# Patient Record
Sex: Male | Born: 1950
Health system: Southern US, Community
[De-identification: ages and names within clinical notes are randomized; demographics above are authoritative.]

## PROBLEM LIST (undated history)

## (undated) DIAGNOSIS — M549 Dorsalgia, unspecified: Secondary | ICD-10-CM

## (undated) DIAGNOSIS — H3320 Serous retinal detachment, unspecified eye: Secondary | ICD-10-CM

## (undated) DIAGNOSIS — K219 Gastro-esophageal reflux disease without esophagitis: Secondary | ICD-10-CM

## (undated) DIAGNOSIS — N200 Calculus of kidney: Secondary | ICD-10-CM

## (undated) DIAGNOSIS — H43813 Vitreous degeneration, bilateral: Principal | ICD-10-CM

## (undated) DIAGNOSIS — F419 Anxiety disorder, unspecified: Secondary | ICD-10-CM

## (undated) DIAGNOSIS — I10 Essential (primary) hypertension: Secondary | ICD-10-CM

## (undated) DIAGNOSIS — S46119A Strain of muscle, fascia and tendon of long head of biceps, unspecified arm, initial encounter: Secondary | ICD-10-CM

## (undated) DIAGNOSIS — H3323 Serous retinal detachment, bilateral: Secondary | ICD-10-CM

## (undated) DIAGNOSIS — D649 Anemia, unspecified: Secondary | ICD-10-CM

## (undated) DIAGNOSIS — N189 Chronic kidney disease, unspecified: Secondary | ICD-10-CM

## (undated) HISTORY — DX: Chronic kidney disease, unspecified: N18.9

## (undated) HISTORY — DX: Essential (primary) hypertension: I10

## (undated) HISTORY — DX: Dorsalgia, unspecified: M54.9

## (undated) HISTORY — DX: Serous retinal detachment, unspecified eye: H33.20

## (undated) HISTORY — DX: Vitreous degeneration, bilateral: H43.813

## (undated) HISTORY — DX: Serous retinal detachment, bilateral: H33.23

## (undated) HISTORY — PX: HYDROCELE EXCISION / REPAIR: SUR1145

## (undated) HISTORY — PX: RETINAL DETACHMENT SURGERY: SHX105

## (undated) HISTORY — DX: Anemia, unspecified: D64.9

## (undated) HISTORY — PX: NASAL SINUS SURGERY: SHX719

## (undated) HISTORY — DX: Calculus of kidney: N20.0

## (undated) HISTORY — DX: Gastro-esophageal reflux disease without esophagitis: K21.9

## (undated) HISTORY — PX: VASECTOMY: SHX75

## (undated) HISTORY — DX: Anxiety disorder, unspecified: F41.9

## (undated) HISTORY — PX: LITHOTRIPSY: SUR834

## (undated) HISTORY — DX: Strain of muscle, fascia and tendon of long head of biceps, unspecified arm, initial encounter: S46.119A

## (undated) HISTORY — PX: SPINAL FUSION: SHX223

## (undated) HISTORY — PX: COLONOSCOPY W/ BIOPSIES AND POLYPECTOMY: SHX1376

## (undated) HISTORY — PX: KNEE ARTHROSCOPY: SUR90

---

## 2001-09-04 ENCOUNTER — Encounter: Payer: Self-pay | Admitting: Neurosurgery

## 2001-09-04 ENCOUNTER — Encounter: Admission: RE | Admit: 2001-09-04 | Discharge: 2001-09-04 | Payer: Self-pay | Admitting: Neurosurgery

## 2001-09-18 ENCOUNTER — Encounter: Payer: Self-pay | Admitting: Neurosurgery

## 2001-09-18 ENCOUNTER — Encounter: Admission: RE | Admit: 2001-09-18 | Discharge: 2001-09-18 | Payer: Self-pay | Admitting: Neurosurgery

## 2001-10-25 ENCOUNTER — Encounter: Admission: RE | Admit: 2001-10-25 | Discharge: 2001-10-25 | Payer: Self-pay | Admitting: Neurosurgery

## 2001-10-25 ENCOUNTER — Encounter: Payer: Self-pay | Admitting: Neurosurgery

## 2001-11-29 ENCOUNTER — Encounter: Payer: Self-pay | Admitting: Neurosurgery

## 2001-12-01 ENCOUNTER — Ambulatory Visit (HOSPITAL_COMMUNITY): Admission: RE | Admit: 2001-12-01 | Discharge: 2001-12-01 | Payer: Self-pay | Admitting: Neurosurgery

## 2001-12-04 ENCOUNTER — Encounter: Admission: RE | Admit: 2001-12-04 | Discharge: 2001-12-04 | Payer: Self-pay | Admitting: Neurosurgery

## 2001-12-04 ENCOUNTER — Encounter: Payer: Self-pay | Admitting: Neurosurgery

## 2001-12-29 ENCOUNTER — Encounter: Payer: Self-pay | Admitting: Neurosurgery

## 2001-12-29 ENCOUNTER — Inpatient Hospital Stay (HOSPITAL_COMMUNITY): Admission: RE | Admit: 2001-12-29 | Discharge: 2002-01-01 | Payer: Self-pay | Admitting: Neurosurgery

## 2002-02-13 ENCOUNTER — Encounter: Admission: RE | Admit: 2002-02-13 | Discharge: 2002-02-13 | Payer: Self-pay | Admitting: Neurosurgery

## 2002-02-13 ENCOUNTER — Encounter: Payer: Self-pay | Admitting: Neurosurgery

## 2004-01-12 HISTORY — PX: SPINE SURGERY: SHX786

## 2004-09-29 ENCOUNTER — Ambulatory Visit (HOSPITAL_BASED_OUTPATIENT_CLINIC_OR_DEPARTMENT_OTHER): Admission: RE | Admit: 2004-09-29 | Discharge: 2004-09-29 | Payer: Self-pay | Admitting: Urology

## 2004-09-29 ENCOUNTER — Ambulatory Visit (HOSPITAL_COMMUNITY): Admission: RE | Admit: 2004-09-29 | Discharge: 2004-09-29 | Payer: Self-pay | Admitting: Urology

## 2006-04-15 ENCOUNTER — Ambulatory Visit: Payer: Self-pay | Admitting: Physical Medicine & Rehabilitation

## 2006-05-20 ENCOUNTER — Ambulatory Visit: Payer: Self-pay | Admitting: Physical Medicine & Rehabilitation

## 2006-08-05 ENCOUNTER — Ambulatory Visit: Payer: Self-pay | Admitting: Physical Medicine & Rehabilitation

## 2006-09-28 ENCOUNTER — Ambulatory Visit: Payer: Self-pay | Admitting: Physical Medicine & Rehabilitation

## 2006-12-30 ENCOUNTER — Ambulatory Visit: Payer: Self-pay | Admitting: Physical Medicine & Rehabilitation

## 2007-01-04 ENCOUNTER — Ambulatory Visit: Payer: Self-pay | Admitting: Family Medicine

## 2007-01-04 DIAGNOSIS — M5137 Other intervertebral disc degeneration, lumbosacral region: Secondary | ICD-10-CM | POA: Insufficient documentation

## 2007-01-04 DIAGNOSIS — G4700 Insomnia, unspecified: Secondary | ICD-10-CM | POA: Insufficient documentation

## 2007-01-04 LAB — CONVERTED CEMR LAB
AST: 24 units/L (ref 0–37)
Alkaline Phosphatase: 61 units/L (ref 39–117)
BUN: 12 mg/dL (ref 6–23)
Glucose, Bld: 101 mg/dL — ABNORMAL HIGH (ref 70–99)
HDL: 47 mg/dL (ref >39)
Hemoglobin: 16.6 g/dL (ref 13.0–17.0)
LDL Cholesterol: 117 mg/dL — ABNORMAL HIGH (ref 0–99)
MCHC: 33.5 g/dL (ref 30.0–36.0)
MCV: 91.7 fL (ref 78.0–100.0)
RBC: 5.4 M/uL (ref 4.22–5.81)
RDW: 13.2 % (ref 11.5–15.5)
Sodium: 141 meq/L (ref 135–145)
Total Bilirubin: 1.3 mg/dL — ABNORMAL HIGH (ref 0.3–1.2)
Total CHOL/HDL Ratio: 4.1
Triglycerides: 152 mg/dL — ABNORMAL HIGH (ref <150)
VLDL: 30 mg/dL (ref 0–40)

## 2007-01-09 ENCOUNTER — Encounter: Payer: Self-pay | Admitting: Family Medicine

## 2007-01-19 ENCOUNTER — Telehealth: Payer: Self-pay | Admitting: Family Medicine

## 2007-03-31 ENCOUNTER — Ambulatory Visit: Payer: Self-pay | Admitting: Physical Medicine & Rehabilitation

## 2007-04-04 ENCOUNTER — Telehealth: Payer: Self-pay | Admitting: Family Medicine

## 2007-04-04 ENCOUNTER — Ambulatory Visit: Payer: Self-pay | Admitting: Family Medicine

## 2007-04-05 ENCOUNTER — Telehealth (INDEPENDENT_AMBULATORY_CARE_PROVIDER_SITE_OTHER): Payer: Self-pay | Admitting: *Deleted

## 2007-04-28 ENCOUNTER — Ambulatory Visit: Payer: Self-pay | Admitting: Physical Medicine & Rehabilitation

## 2007-05-10 ENCOUNTER — Telehealth: Payer: Self-pay | Admitting: Family Medicine

## 2007-06-02 ENCOUNTER — Ambulatory Visit: Payer: Self-pay | Admitting: Physical Medicine & Rehabilitation

## 2007-06-30 ENCOUNTER — Ambulatory Visit: Payer: Self-pay | Admitting: Physical Medicine & Rehabilitation

## 2007-07-21 ENCOUNTER — Encounter
Admission: RE | Admit: 2007-07-21 | Discharge: 2007-09-21 | Payer: Self-pay | Admitting: Physical Medicine & Rehabilitation

## 2007-07-28 ENCOUNTER — Ambulatory Visit: Payer: Self-pay | Admitting: Physical Medicine & Rehabilitation

## 2007-08-02 ENCOUNTER — Encounter: Admission: RE | Admit: 2007-08-02 | Discharge: 2007-08-02 | Payer: Self-pay | Admitting: Family Medicine

## 2007-08-02 ENCOUNTER — Ambulatory Visit: Payer: Self-pay | Admitting: Family Medicine

## 2007-08-02 DIAGNOSIS — R319 Hematuria, unspecified: Secondary | ICD-10-CM | POA: Insufficient documentation

## 2007-08-02 DIAGNOSIS — F17200 Nicotine dependence, unspecified, uncomplicated: Secondary | ICD-10-CM | POA: Insufficient documentation

## 2007-08-02 LAB — CONVERTED CEMR LAB
Nitrite: NEGATIVE
Protein, U semiquant: 30
Urobilinogen, UA: 0.2
WBC Urine, dipstick: NEGATIVE
pH: 6

## 2007-08-03 LAB — CONVERTED CEMR LAB
ALT: 28 units/L (ref 0–53)
BUN: 13 mg/dL (ref 6–23)
CO2: 24 meq/L (ref 19–32)
Calcium: 9.8 mg/dL (ref 8.4–10.5)
Chloride: 104 meq/L (ref 96–112)
Creatinine, Ser: 0.91 mg/dL (ref 0.40–1.50)
Glucose, Bld: 115 mg/dL — ABNORMAL HIGH (ref 70–99)

## 2007-08-04 ENCOUNTER — Encounter: Payer: Self-pay | Admitting: Family Medicine

## 2007-08-04 ENCOUNTER — Ambulatory Visit: Payer: Self-pay | Admitting: Family Medicine

## 2007-08-04 DIAGNOSIS — J449 Chronic obstructive pulmonary disease, unspecified: Secondary | ICD-10-CM | POA: Insufficient documentation

## 2007-08-15 ENCOUNTER — Telehealth: Payer: Self-pay | Admitting: Family Medicine

## 2007-08-15 ENCOUNTER — Encounter: Payer: Self-pay | Admitting: Family Medicine

## 2007-08-24 ENCOUNTER — Ambulatory Visit (HOSPITAL_COMMUNITY): Admission: RE | Admit: 2007-08-24 | Discharge: 2007-08-24 | Payer: Self-pay | Admitting: Urology

## 2007-08-28 ENCOUNTER — Ambulatory Visit: Payer: Self-pay | Admitting: Family Medicine

## 2007-09-01 ENCOUNTER — Ambulatory Visit: Payer: Self-pay | Admitting: Physical Medicine & Rehabilitation

## 2007-09-29 ENCOUNTER — Ambulatory Visit: Payer: Self-pay | Admitting: Physical Medicine & Rehabilitation

## 2007-10-27 ENCOUNTER — Ambulatory Visit: Payer: Self-pay | Admitting: Physical Medicine & Rehabilitation

## 2007-12-22 ENCOUNTER — Ambulatory Visit: Payer: Self-pay | Admitting: Physical Medicine & Rehabilitation

## 2007-12-26 ENCOUNTER — Ambulatory Visit: Payer: Self-pay | Admitting: Family Medicine

## 2008-02-09 ENCOUNTER — Ambulatory Visit: Payer: Self-pay | Admitting: Physical Medicine & Rehabilitation

## 2008-03-15 ENCOUNTER — Ambulatory Visit: Payer: Self-pay | Admitting: Physical Medicine & Rehabilitation

## 2008-04-19 ENCOUNTER — Ambulatory Visit: Payer: Self-pay | Admitting: Physical Medicine & Rehabilitation

## 2008-06-14 ENCOUNTER — Ambulatory Visit: Payer: Self-pay | Admitting: Physical Medicine & Rehabilitation

## 2008-08-05 ENCOUNTER — Telehealth: Payer: Self-pay | Admitting: Family Medicine

## 2008-08-15 ENCOUNTER — Ambulatory Visit: Payer: Self-pay | Admitting: Family Medicine

## 2008-08-16 LAB — CONVERTED CEMR LAB
ALT: 33 units/L (ref 0–53)
CO2: 22 meq/L (ref 19–32)
Cholesterol: 223 mg/dL — ABNORMAL HIGH (ref 0–200)
LDL Cholesterol: 127 mg/dL — ABNORMAL HIGH (ref 0–99)
Potassium: 4.2 meq/L (ref 3.5–5.3)
Sodium: 142 meq/L (ref 135–145)
Total Bilirubin: 1.2 mg/dL (ref 0.3–1.2)
Total Protein: 7.3 g/dL (ref 6.0–8.3)
VLDL: 41 mg/dL — ABNORMAL HIGH (ref 0–40)

## 2008-11-19 ENCOUNTER — Telehealth: Payer: Self-pay | Admitting: Family Medicine

## 2008-12-09 ENCOUNTER — Ambulatory Visit: Payer: Self-pay | Admitting: Family Medicine

## 2008-12-09 DIAGNOSIS — H02419 Mechanical ptosis of unspecified eyelid: Secondary | ICD-10-CM | POA: Insufficient documentation

## 2008-12-19 ENCOUNTER — Encounter: Payer: Self-pay | Admitting: Family Medicine

## 2009-02-01 IMAGING — CR DG ABDOMEN 1V
1 series · 1 of 1 positions shown · non-contrast
Comparison: No prior studies available.

CLINICAL DATA: Right ureteral pelvic junction stone.  Pre
lithotripsy.

ABDOMEN - 1 VIEW

[t abdomen supine]
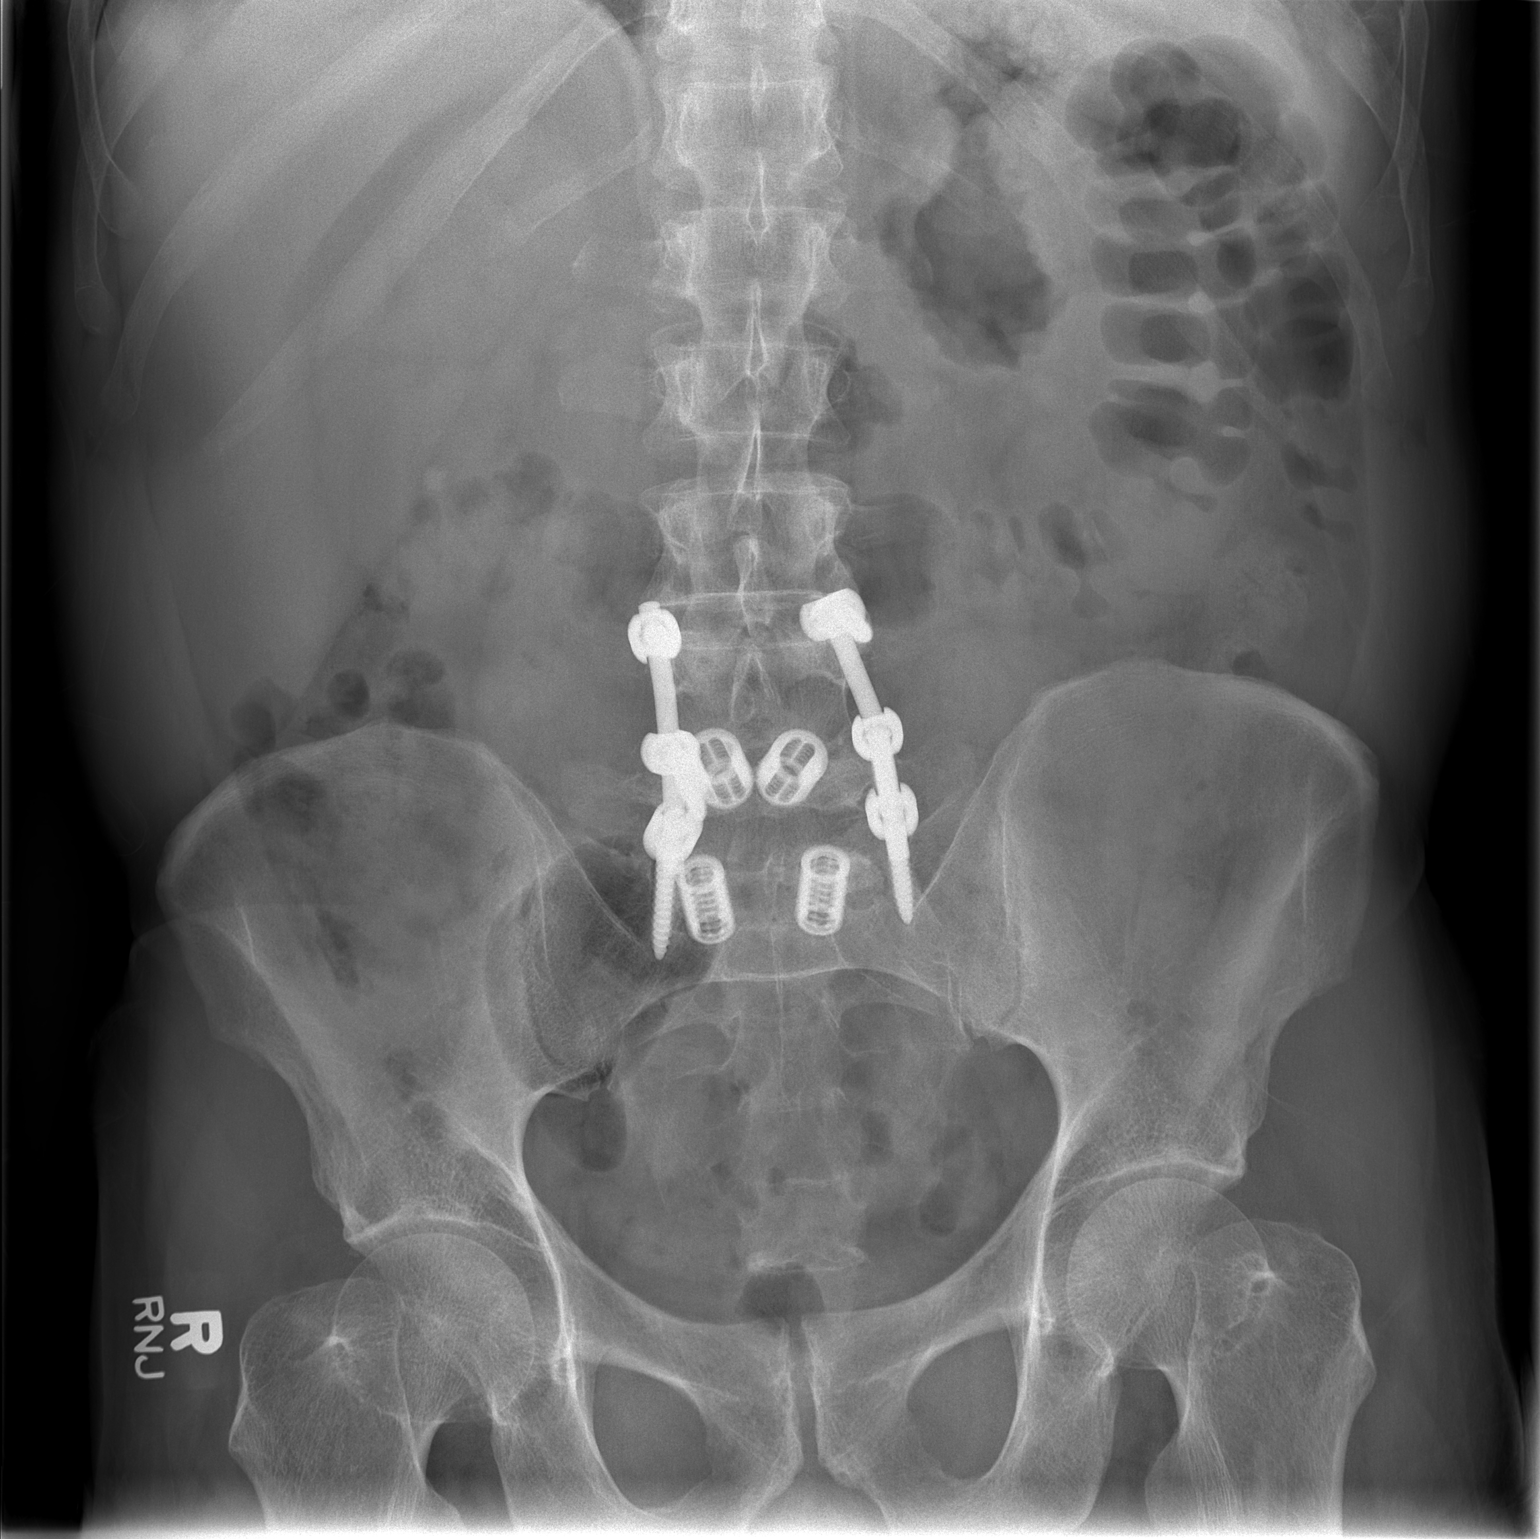

[1 of 1 positions shown; findings below may reference images not displayed]

FINDINGS: There is a 6 mm in diameter stone associated with the
lower pole of the right kidney.  There are no other definite stones
noted by plain film.  There has been a previous lower lumbar fusion
from the L4 through S1 levels.  The bowel gas pattern is normal.
IMPRESSION: 6 mm in diameter lower pole right renal calculus.

## 2009-02-07 ENCOUNTER — Encounter: Payer: Self-pay | Admitting: Family Medicine

## 2009-02-13 ENCOUNTER — Telehealth: Payer: Self-pay | Admitting: Family Medicine

## 2009-03-14 ENCOUNTER — Telehealth: Payer: Self-pay | Admitting: Family Medicine

## 2009-04-17 ENCOUNTER — Ambulatory Visit: Payer: Self-pay | Admitting: Family Medicine

## 2009-04-17 LAB — CONVERTED CEMR LAB: Blood Glucose, AC Bkfst: 87 mg/dL

## 2009-04-21 ENCOUNTER — Encounter: Payer: Self-pay | Admitting: Family Medicine

## 2009-04-21 LAB — CONVERTED CEMR LAB
Cholesterol: 219 mg/dL — ABNORMAL HIGH (ref 0–200)
Total CHOL/HDL Ratio: 4.2

## 2009-05-20 ENCOUNTER — Ambulatory Visit: Payer: Self-pay | Admitting: Family Medicine

## 2009-10-15 ENCOUNTER — Ambulatory Visit: Payer: Self-pay | Admitting: Family Medicine

## 2009-10-15 DIAGNOSIS — N200 Calculus of kidney: Secondary | ICD-10-CM | POA: Insufficient documentation

## 2009-11-17 ENCOUNTER — Ambulatory Visit: Payer: Self-pay | Admitting: Family Medicine

## 2009-11-18 ENCOUNTER — Encounter (INDEPENDENT_AMBULATORY_CARE_PROVIDER_SITE_OTHER): Payer: Self-pay | Admitting: *Deleted

## 2009-12-16 ENCOUNTER — Ambulatory Visit (HOSPITAL_COMMUNITY): Payer: Self-pay | Admitting: Psychology

## 2009-12-25 ENCOUNTER — Ambulatory Visit (HOSPITAL_COMMUNITY): Payer: Self-pay | Admitting: Licensed Clinical Social Worker

## 2010-01-19 ENCOUNTER — Ambulatory Visit: Admit: 2010-01-19 | Payer: Self-pay | Admitting: Family Medicine

## 2010-01-23 ENCOUNTER — Ambulatory Visit
Admission: RE | Admit: 2010-01-23 | Discharge: 2010-01-23 | Payer: Self-pay | Source: Home / Self Care | Attending: Family Medicine | Admitting: Family Medicine

## 2010-01-27 ENCOUNTER — Ambulatory Visit (HOSPITAL_COMMUNITY)
Admission: RE | Admit: 2010-01-27 | Discharge: 2010-01-27 | Payer: Self-pay | Source: Home / Self Care | Attending: Licensed Clinical Social Worker | Admitting: Licensed Clinical Social Worker

## 2010-02-08 LAB — CONVERTED CEMR LAB
ALT: 29 units/L (ref 0–53)
Bilirubin Urine: NEGATIVE
Blood in Urine, dipstick: NEGATIVE
CO2: 29 meq/L (ref 19–32)
Calcium: 9.9 mg/dL (ref 8.4–10.5)
Chloride: 101 meq/L (ref 96–112)
Cholesterol: 210 mg/dL — ABNORMAL HIGH (ref 0–200)
Glucose, Urine, Semiquant: NEGATIVE
Potassium: 4.3 meq/L (ref 3.5–5.3)
Protein, U semiquant: NEGATIVE
Sodium: 140 meq/L (ref 135–145)
Total Protein: 6.8 g/dL (ref 6.0–8.3)
VLDL: 49 mg/dL — ABNORMAL HIGH (ref 0–40)
pH: 6

## 2010-02-12 NOTE — Assessment & Plan Note (Signed)
Summary: f/u GAD   Vital Signs:  Patient profile:   60 year old male Height:      68.5 inches Weight:      161 pounds BMI:     24.21 O2 Sat:      98 % on Room air Pulse rate:   91 / minute BP sitting:   138 / 90  (left arm) Cuff size:   regular  Vitals Entered By: Payton Spark CMA (November 17, 2009 10:40 AM)  O2 Flow:  Room air CC: F/U mood and sleep.    Primary Care Angely Dietz:  Seymour Bars DO  CC:  F/U mood and sleep. [name]  History of Present Illness: 60 yo WM presents for f/u mood and chronic insomnia.  He was changed over to Seroquel last night at night for his anxiety and sleep problems b/c Alfonso Patten was not helping.  He was too sedated during the day on Seroquel XR  50 mg/ day.    He has been on SSRIs and SNRIs in the past but had such severe GERD that he could not tolerate either one.  He was seen by [facility] years ago but has not been seen for a while.      Current Medications (verified): 1)  Seroquel Xr 50 Mg Xr24h-Tab (Quetiapine Fumarate) .[name].. 1 Tab By Mouth At Bedtime 2)  Tramadol Hcl 50 Mg  Tabs (Tramadol Hcl) .... Take 1-2 Tablet By Mouth Every 8 Hours 3)  Alprazolam 0.5 Mg  Tabs (Alprazolam) .... Take 1/2 Tablet By Mouth Bid and 1 Tab Po Qhs. 4)  Omeprazole 40 Mg Cpdr (Omeprazole) .[name].. 1 Tab By Mouth Daily; Take 30 Min Before Breakfast 5)  Multivitamins   Tabs (Multiple Vitamin) .... Take 1 Tablet By Mouth Once A Day 6)  Proair Hfa 108 (90 Base) Mcg/act  Aers (Albuterol Sulfate) .... 2 Puffs Inhaled Every 4-6 Hours As Needed 7)  Finasteride 5 Mg Tabs (Finasteride) .... Take 1 Tablet By Mouth Once A Day 8)  Fish Oil 1000 Mg Caps (Omega-3 Fatty Acids)  Allergies (verified): 1)  ! Pcn 2)  ! Amoxicillin 3)  ! Pravachol  Past History:  Past Medical History: Reviewed history from 12/09/2008 and no changes required. sees Dr Logan Bores (urology) Dr Noelle Penner (GI) Dr Jess Barters (pain managment) Dr Yehuda Mao chiropractor for back pain Dr Marcellina Millin (psych) Dr Channing Mutters (neurosurgery) Dr Mathis Bud ( hematology)  Spirometry 08-04-07 FVC 94%, FEV1 68%, FEV1% is 73%, moderate obstruction with sig improvement in FVC  nephrolithiasis chronic LBP after fusion/ rods  EGD/colon 2008 Hiatal hernia, GERD, diverticulosis anxiety/ sleep/ chronic pain Factor V Leiden carrier  Past Surgical History: Reviewed history from 01/04/2007 and no changes required. sinus surgery 1987 knee 1986 vasectomy 1988 spine disc 2003 L spine fusion/ rods 2006  Social History: Reviewed history from 01/04/2007 and no changes required. Disabled from back problems.  Used to work for [employer]. Lives with parents, no relationships. 4 kids: 1 in [place], others out of state. Smokes 1.5 ppd x 35 yrs. 3-4 ETOH per day. Works out Engineer, manufacturing. Healthy diet.  Review of Systems Psych:  Complains of anxiety, depression, and irritability; denies panic attacks and suicidal thoughts/plans.  Physical Exam  General:  alert, well-developed, well-nourished, and well-hydrated.   Psych:  good eye contact, flat affect, and slightly anxious.     Impression & Recommendations:  Problem # 1:  ANXIETY STATE, UNSPECIFIED (ICD-300.00) Assessment Unchanged Too sedated on Seroquel XR 50 mg qPM so  changed him to Seroquel 25 - 1/2 tab by mouth qPM.  Add behavioral therapy at [facility].  Call if any problems.  It did help his insomnia and he's now off Lunesta.  Call if problems.  F/U in 2 mos.  No changes made to his Xanax.  Complicated by chronic pain syndrome.  Does note that his anxiety was much better on vacation. His updated medication list for this problem includes:    Alprazolam 0.5 Mg Tabs (Alprazolam) .[name]... Take 1/2 tablet by mouth bid and 1 tab po qhs.  Orders: Psychology Referral (Psychology)  Complete Medication List: 1)  Seroquel 25 Mg Tabs (Quetiapine fumarate) .[name].. 1 tab by mouth q pm 2)  Tramadol Hcl 50 Mg Tabs (Tramadol hcl) .... Take 1-2 tablet by mouth  every 8 hours 3)  Alprazolam 0.5 Mg Tabs (Alprazolam) .... Take 1/2 tablet by mouth bid and 1 tab po qhs. 4)  Omeprazole 40 Mg Cpdr (Omeprazole) .[name].. 1 tab by mouth daily; take 30 min before breakfast 5)  Multivitamins Tabs (Multiple vitamin) .... Take 1 tablet by mouth once a day 6)  Proair Hfa 108 (90 Base) Mcg/act Aers (Albuterol sulfate) .... 2 puffs inhaled every 4-6 hours as needed 7)  Finasteride 5 Mg Tabs (Finasteride) .... Take 1 tablet by mouth once a day 8)  Fish Oil 1000 Mg Caps (Omega-3 fatty acids)  Patient Instructions: 1)  Start short acting seroquel -- 1/2 tab at 7 pm each day for anxiety/ sleep. 2)  Stay on same dose of Alprazolam. 3)  Add behavioral therapy at [facility]. 4)  Call if any problems. 5)  REturn for f/u in 2 mos. Prescriptions: SEROQUEL 25 MG TABS (QUETIAPINE FUMARATE) 1 tab by mouth q PM  #30 x 1   Entered and Authorized by:   Seymour Bars DO   Signed by:   Seymour Bars DO on 11/17/2009   Method used:   Electronically to        [facility] [phone]* (retail)       [address] [place], [place]  [address]       Ph: [phone]       Fax: [phone]   RxID:   [id number]    Orders Added: 1)  Psychology Referral [Psychology] 2)  Est. Patient Level III [63875]

## 2010-02-12 NOTE — Progress Notes (Signed)
Summary: Alprazolam refills  Phone Note Refill Request   Refills Requested: Medication #1:  ALPRAZOLAM 0.5 MG  TABS take 1/2 tablet by mouth bid and 1 tab po qhs. Initial call taken by: Payton Spark CMA,  March 14, 2009 3:31 PM    Prescriptions: ALPRAZOLAM 0.5 MG  TABS (ALPRAZOLAM) take 1/2 tablet by mouth bid and 1 tab po qhs.  #60 x 0   Entered and Authorized by:   Seymour Bars DO   Signed by:   Seymour Bars DO on 03/17/2009   Method used:   Telephoned to ...       [facility] [phone]* (retail)       [address] [place], [place]  [address]       Ph: [phone]       Fax: [phone]   RxID:   [id number]  Can you call in his RFs?  Thanks.  Seymour Bars, D.O.  Appended Document: Alprazolam refills Called in

## 2010-02-12 NOTE — Progress Notes (Signed)
  Phone Note Refill Request Message from:  Pharmacy on [facility] [phone]  Refills Requested: Medication #1:  ALPRAZOLAM 0.5 MG  TABS take 1/2 tablet by mouth bid and 1 tab po qhs. Initial call taken by: Kandice Hams,  February 13, 2009 5:08 PM    Prescriptions: ALPRAZOLAM 0.5 MG  TABS (ALPRAZOLAM) take 1/2 tablet by mouth bid and 1 tab po qhs.  #60 x 0   Entered and Authorized by:   Seymour Bars DO   Signed by:   Seymour Bars DO on 02/13/2009   Method used:   Printed then faxed to ...       [facility] [phone]* (retail)       [address] [place], [place]  [address]       Ph: [phone]       Fax: [phone]   RxID:   [id number]   Appended Document:  LEFT MSG RX FAXED

## 2010-02-12 NOTE — Assessment & Plan Note (Signed)
Summary: f/u sleep   Vital Signs:  Patient profile:   60 year old male Height:      68.5 inches Weight:      160 pounds BMI:     24.06 O2 Sat:      98 % on Room air Pulse rate:   98 / minute BP sitting:   155 / 89  (left arm) Cuff size:   regular  Vitals Entered By: Payton Spark CMA (January 23, 2010 1:19 PM)  O2 Flow:  Room air CC: F/U. Seroquel did not work well.    Primary Care Provider:  Seymour Bars DO  CC:  F/U. Seroquel did not work well. [name]  History of Present Illness: 60 yo WM presents for f/u Seroquel.  He had to stop it b/c it knocked him out.  He wants to get back on Lunesta for sleep.  He is seeing Merlene Morse for counseling.  His mood has been stable but he has chronic back pain -- seeing Dr Channing Mutters, on Tramadol alone.  He has been under stress at home.  His father's health is poor.  He has been trying to exercise and do relaxation techniques on a regular basis.       Allergies (verified): 1)  ! Pcn 2)  ! Amoxicillin 3)  ! Pravachol  Past History:  Past Medical History: Reviewed history from 12/09/2008 and no changes required. sees Dr Logan Bores (urology) Dr Noelle Penner (GI) Dr Jess Barters (pain managment) Dr Yehuda Mao chiropractor for back pain Dr Marcellina Millin (psych) Dr Channing Mutters (neurosurgery) Dr Mathis Bud ( hematology)  Spirometry 08-04-07 FVC 94%, FEV1 68%, FEV1% is 73%, moderate obstruction with sig improvement in FVC  nephrolithiasis chronic LBP after fusion/ rods  EGD/colon 2008 Hiatal hernia, GERD, diverticulosis anxiety/ sleep/ chronic pain Factor V Leiden carrier  Past Surgical History: Reviewed history from 01/04/2007 and no changes required. sinus surgery 1987 knee 1986 vasectomy 1988 spine disc 2003 L spine fusion/ rods 2006  Social History: Reviewed history from 01/04/2007 and no changes required. Disabled from back problems.  Used to work for [employer]. Lives with parents, no relationships. 4 kids: 1 in [place], others out of  state. Smokes 1.5 ppd x 35 yrs. 3-4 ETOH per day. Works out Engineer, manufacturing. Healthy diet.  Review of Systems      See HPI  Physical Exam  General:  alert, well-developed, well-nourished, and well-hydrated.   Head:  normocephalic and atraumatic.   Mouth:  pharynx pink and moist.   Neck:  no masses.   Lungs:  Normal respiratory effort, chest expands symmetrically. Lungs are clear to auscultation, no crackles or wheezes. Heart:  Normal rate and regular rhythm. S1 and S2 normal without gallop, murmur, click, rub or other extra sounds. Extremities:  no LE edema Neurologic:  no tremor Skin:  color normal.   Psych:  good eye contact, flat affect, and slightly anxious.     Impression & Recommendations:  Problem # 1:  INSOMNIA (ICD-780.52) Did not tolerate Seroquel or Seroquel XR due to sedation. Will go back to [place].  Avoid ETOH at night and do not take with Alprazolam. We discussed taking Lunesta 3-4 days/ wk instead of 7 days / wk as tolerance developed in the past.   His updated medication list for this problem includes:    Lunesta 3 Mg Tabs (Eszopiclone) .[name]... Take 1 tab by mouth at bedtime  Problem # 2:  DEPRESSION (ICD-311) His depression/ stress/ anxiety/ chronic pain are definitely playing a part into  his chronic insomnia.  In counseling and declined psychiatry f/u or restart SSRIs.  Regular exercise and counseling helping. His updated medication list for this problem includes:    Alprazolam 0.5 Mg Tabs (Alprazolam) .[name]... Take 1/2 tablet by mouth bid and 1 tab po qhs.  Problem # 3:  DISC DISEASE, LUMBAR (ICD-722.52) Managed by Dr Channing Mutters and the chiropractor.  Stable.  Unchanged.  Complete Medication List: 1)  Tramadol Hcl 50 Mg Tabs (Tramadol hcl) .... Take 1-2 tablet by mouth every 8 hours 2)  Alprazolam 0.5 Mg Tabs (Alprazolam) .... Take 1/2 tablet by mouth bid and 1 tab po qhs. 3)  Omeprazole 40 Mg Cpdr (Omeprazole) .[name].. 1 tab by mouth daily; take 30 min before breakfast 4)   Multivitamins Tabs (Multiple vitamin) .... Take 1 tablet by mouth once a day 5)  Proair Hfa 108 (90 Base) Mcg/act Aers (Albuterol sulfate) .... 2 puffs inhaled every 4-6 hours as needed 6)  Finasteride 5 Mg Tabs (Finasteride) .... Take 1 tablet by mouth once a day 7)  Fish Oil 1000 Mg Caps (Omega-3 fatty acids) 8)  Lunesta 3 Mg Tabs (Eszopiclone) .... Take 1 tab by mouth at bedtime  Patient Instructions: 1)  Will change you back to Lunesta at night.  Can take either every other day or M--> F to prevent tolerance. 2)  Return for a nurse BP in 1 month. 3)  Return for f/u visit in 4 mos. Prescriptions: PROAIR HFA 108 (90 BASE) MCG/ACT  AERS (ALBUTEROL SULFATE) 2 puffs inhaled every 4-6 hours as needed  #1 x 2   Entered and Authorized by:   Seymour Bars DO   Signed by:   Seymour Bars DO on 01/23/2010   Method used:   Electronically to        [facility] [phone]* (retail)       [address] [place], [place]  [address]       Ph: [phone]       Fax: [phone]   RxID:   [id number] LUNESTA 3 MG TABS (ESZOPICLONE) Take 1 tab by mouth at bedtime  #30 x 1   Entered and Authorized by:   Seymour Bars DO   Signed by:   Seymour Bars DO on 01/23/2010   Method used:   Printed then faxed to ...       [facility] [phone]* (retail)       [address] [place], [place]  [address]       Ph: [phone]       Fax: [phone]   RxID:   [id number]    Orders Added: 1)  Est. Patient Level IV [30160]

## 2010-02-12 NOTE — Consult Note (Signed)
Summary: [facility]  [facility]   Imported By: Lanelle Bal 03/06/2009 13:07:51  _____________________________________________________________________  External Attachment:    Type:   Image     Comment:   External Document

## 2010-02-12 NOTE — Letter (Signed)
Summary: Primary Care Consult Scheduled Letter  [facility]  [address]   [place], [place] [address]   Phone: [phone]  Fax: [phone]      11/18/2009 MRN: [id number]  QUANTAVIS OBRYANT [address] [place], [place]  [address]    Dear Mr. SCHOWALTER,    We have scheduled an appointment for you.  At the recommendation of Dr.Bowen, we have scheduled you a consult with [facility] on 12/08/09 at 9:30.  Their located in the same building as [place], but they are downstairs in [address]. The office phone number is [phone].  If this appointment day and time is not convenient for you, please feel free to call the office of the counselor you are being referred to at the number listed above and reschedule the appointment.     It is important for you to keep your scheduled appointments. We are here to make sure you are given good patient care.    Thank you, [facility]  Patient Care Coordinator [facility] [place]

## 2010-02-12 NOTE — Assessment & Plan Note (Signed)
Summary: f/u smoking/ lipids   Vital Signs:  Patient profile:   60 year old male Height:      68.5 inches Weight:      162 pounds BMI:     24.36 O2 Sat:      98 % on Room air Pulse rate:   94 / minute BP sitting:   128 / 83  (left arm) Cuff size:   regular  Vitals Entered By: Payton Spark CMA (April 17, 2009 10:24 AM)  O2 Flow:  Room air CC: 6 mo F/U. Fasting labs.    Primary Care Provider:  Seymour Bars DO  CC:  6 mo F/U. Fasting labs. .  History of Present Illness: 60 yo WM presents for f/u IFG.  He has tried eating healthier and is taking Omega 3 Fish Oil for cholesterol.  His fasting sugar is down to 87 today which is great.  He has wanted to quit smoking and is interested in [employer].    He is due for cholesterol.  He came off statins due to SEs.  He is exercising regularly.      Current Medications (verified): 1)  Lunesta 3 Mg  Tabs (Eszopiclone) .... Take 1 Tablet By Mouth Once A Day At Bedtime As Needed 2)  Tramadol Hcl 50 Mg  Tabs (Tramadol Hcl) .... Take 1-2 Tablet By Mouth Every 8 Hours 3)  Alprazolam 0.5 Mg  Tabs (Alprazolam) .... Take 1/2 Tablet By Mouth Bid and 1 Tab Po Qhs. 4)  Omeprazole 20 Mg  Cpdr (Omeprazole) .... Take 1 Tablet By Mouth Once A Day 5)  Multivitamins   Tabs (Multiple Vitamin) .... Take 1 Tablet By Mouth Once A Day 6)  Tizanidine Hcl 4 Mg  Tabs (Tizanidine Hcl) .... Take 1 Tablet By Mouth Once A Day 7)  Proair Hfa 108 (90 Base) Mcg/act  Aers (Albuterol Sulfate) .... 2 Puffs Inhaled Every 4-6 Hours As Needed 8)  Finasteride 5 Mg Tabs (Finasteride) .... Take 1 Tablet By Mouth Once A Day 9)  Fish Oil 1000 Mg Caps (Omega-3 Fatty Acids) .... Take 1 Cap By Mouth Once Daily  Allergies (verified): 1)  ! Pcn 2)  ! Amoxicillin 3)  ! Pravachol  Past History:  Past Medical History: Reviewed history from 12/09/2008 and no changes required. sees Dr Logan Bores (urology) Dr Noelle Penner (GI) Dr Jess Barters (pain managment) Dr Yehuda Mao chiropractor for back  pain Dr Marcellina Millin (psych) Dr Channing Mutters (neurosurgery) Dr Mathis Bud ( hematology)  Spirometry 08-04-07 FVC 94%, FEV1 68%, FEV1% is 73%, moderate obstruction with sig improvement in FVC  nephrolithiasis chronic LBP after fusion/ rods  EGD/colon 2008 Hiatal hernia, GERD, diverticulosis anxiety/ sleep/ chronic pain Factor V Leiden carrier  Past Surgical History: Reviewed history from 01/04/2007 and no changes required. sinus surgery 1987 knee 1986 vasectomy 1988 spine disc 2003 L spine fusion/ rods 2006  Social History: Reviewed history from 01/04/2007 and no changes required. Disabled from back problems.  Used to work for [employer]. Lives with parents, no relationships. 4 kids: 1 in [place], others out of state. Smokes 1.5 ppd x 35 yrs. 3-4 ETOH per day. Works out Engineer, manufacturing. Healthy diet.  Review of Systems      See HPI  Physical Exam  General:  alert, well-developed, well-nourished, and well-hydrated.   Head:  normocephalic and atraumatic.   Mouth:  pharynx pink and moist.   Neck:  no masses.   Lungs:  Normal respiratory effort, chest expands symmetrically. Lungs are clear  to auscultation, no crackles or wheezes. Heart:  Normal rate and regular rhythm. S1 and S2 normal without gallop, murmur, click, rub or other extra sounds. Extremities:  no LE edema Skin:  color normal.   Psych:  good eye contact and flat affect.     Impression & Recommendations:  Problem # 1:  HYPERGLYCEMIA (ICD-790.29) Assessment Improved Fasting sugar down is normal at 87-- great!  Problem # 2:  NICOTINE ADDICTION (ICD-305.1) Counseled for 15 min on smoking cessation and he agrees to start on Chantix.  He seems highly motivated to quit at this time. His updated medication list for this problem includes:    Chantix Starting Month Pak 0.5 Mg X 11 & 1 Mg X 42 Tabs (Varenicline tartrate) .[name]... Take as directed  Problem # 3:  DYSLIPIDEMIA (ICD-272.4) Off statin due to SEs.  Update  fasting labs today.   Orders: T-Lipid Profile ([id number])  Problem # 4:  DEPRESSION (ICD-311) Stable.  Home stressors due to parental illness.  RFd his Alprazolam.   His updated medication list for this problem includes:    Alprazolam 0.5 Mg Tabs (Alprazolam) .[name]... Take 1/2 tablet by mouth bid and 1 tab po qhs.  Complete Medication List: 1)  Lunesta 3 Mg Tabs (Eszopiclone) .... Take 1 tablet by mouth once a day at bedtime as needed 2)  Tramadol Hcl 50 Mg Tabs (Tramadol hcl) .... Take 1-2 tablet by mouth every 8 hours 3)  Alprazolam 0.5 Mg Tabs (Alprazolam) .... Take 1/2 tablet by mouth bid and 1 tab po qhs. 4)  Omeprazole 20 Mg Cpdr (Omeprazole) .... Take 1 tablet by mouth once a day 5)  Multivitamins Tabs (Multiple vitamin) .... Take 1 tablet by mouth once a day 6)  Tizanidine Hcl 4 Mg Tabs (Tizanidine hcl) .... Take 1 tablet by mouth once a day 7)  Proair Hfa 108 (90 Base) Mcg/act Aers (Albuterol sulfate) .... 2 puffs inhaled every 4-6 hours as needed 8)  Finasteride 5 Mg Tabs (Finasteride) .... Take 1 tablet by mouth once a day 9)  Fish Oil 1000 Mg Caps (Omega-3 fatty acids) .... Take 1 cap by mouth once daily 10)  Chantix Starting Month Pak 0.5 Mg X 11 & 1 Mg X 42 Tabs (Varenicline tartrate) .... Take as directed  Other Orders: Fingerstick (36416) Glucose, (CBG) (73220)  Patient Instructions: 1)  Fasting sugar looks great at 87! 2)  Fasting lipids today. 3)  Will call you w/ results on Monday. 4)  Start Chantix for smoking cessation. 5)  Return for f/u visit at the end of your starting month pack.   Prescriptions: CHANTIX STARTING MONTH PAK 0.5 MG X 11 & 1 MG X 42 TABS (VARENICLINE TARTRATE) take as directed  #1 pack x 0   Entered and Authorized by:   Seymour Bars DO   Signed by:   Seymour Bars DO on 04/17/2009   Method used:   Electronically to        [facility] [phone]* (retail)       [address] [place], [place]  [address]       Ph: [phone]       Fax:  [phone]   RxID:   [id number] ALPRAZOLAM 0.5 MG  TABS (ALPRAZOLAM) take 1/2 tablet by mouth bid and 1 tab po qhs.  #60 x 0   Entered and Authorized by:   Seymour Bars DO   Signed by:   Seymour Bars  DO on 04/17/2009   Method used:   Printed then faxed to ...       [facility] [phone]* (retail)       [address] [place], [place]  [address]       Ph: [phone]       Fax: [phone]   RxID:   [id number]   Laboratory Results   Blood Tests     CBG Fasting:: 87mg /dL

## 2010-02-12 NOTE — Assessment & Plan Note (Signed)
Summary: CPE/ EKG   Vital Signs:  Patient profile:   60 year old male Height:      68.5 inches Weight:      160 pounds BMI:     24.06 O2 Sat:      96 % on Room air Pulse rate:   87 / minute BP sitting:   136 / 87  (left arm) Cuff size:   regular  Vitals Entered By: Payton Spark CMA (October 15, 2009 10:25 AM)  O2 Flow:  Room air CC: CPE w/ fasting labs   Primary Care Provider:  Seymour Bars DO  CC:  CPE w/ fasting labs.  History of Present Illness: 60 yo WM presents for CPE.  He is seeing Dr Laurine Blazer for his back pain and has not been back to the pain clinic.  He is due for fasting labs today.    Hx of prostate enlargement and kidney stones.  has not seen urology in the last year.  He has not tried Chantix because he feels a little depressed.   Saw [employer] about 3 yrs ago and tried a lot of different anti depressants but they hurt his stomach so he stopped it.  He has been taking Alprazolam on a regular basis.    Smoking and ETOH- more. Poor sleep at night even with Lunesta.  Drinking more to sleep.      Current Medications (verified): 1)  Lunesta 3 Mg  Tabs (Eszopiclone) .... Take 1 Tablet By Mouth Once A Day At Bedtime As Needed 2)  Tramadol Hcl 50 Mg  Tabs (Tramadol Hcl) .... Take 1-2 Tablet By Mouth Every 8 Hours 3)  Alprazolam 0.5 Mg  Tabs (Alprazolam) .... Take 1/2 Tablet By Mouth Bid and 1 Tab Po Qhs. 4)  Omeprazole 20 Mg  Cpdr (Omeprazole) .... Take 1 Tablet By Mouth Once A Day 5)  Multivitamins   Tabs (Multiple Vitamin) .... Take 1 Tablet By Mouth Once A Day 6)  Proair Hfa 108 (90 Base) Mcg/act  Aers (Albuterol Sulfate) .... 2 Puffs Inhaled Every 4-6 Hours As Needed 7)  Finasteride 5 Mg Tabs (Finasteride) .... Take 1 Tablet By Mouth Once A Day 8)  Chantix Starting Month Pak 0.5 Mg X 11 & 1 Mg X 42 Tabs (Varenicline Tartrate) .... Take As Directed 9)  Fish Oil 1000 Mg Caps (Omega-3 Fatty Acids)  Allergies (verified): 1)  ! Pcn 2)  ! Amoxicillin 3)  !  Pravachol  Past History:  Past Medical History: Reviewed history from 12/09/2008 and no changes required. sees Dr Logan Bores (urology) Dr Noelle Penner (GI) Dr Jess Barters (pain managment) Dr Yehuda Mao chiropractor for back pain Dr Marcellina Millin (psych) Dr Channing Mutters (neurosurgery) Dr Mathis Bud ( hematology)  Spirometry 08-04-07 FVC 94%, FEV1 68%, FEV1% is 73%, moderate obstruction with sig improvement in FVC  nephrolithiasis chronic LBP after fusion/ rods  EGD/colon 2008 Hiatal hernia, GERD, diverticulosis anxiety/ sleep/ chronic pain Factor V Leiden carrier  Past Surgical History: Reviewed history from 01/04/2007 and no changes required. sinus surgery 1987 knee 1986 vasectomy 1988 spine disc 2003 L spine fusion/ rods 2006  Family History: Reviewed history from 04/04/2007 and no changes required. mother: blood clots, PE, colon cancer in 47s father colon cancer, prostate cancer in early 31s., DM only child MGF DM  Social History: Reviewed history from 01/04/2007 and no changes required. Disabled from back problems.  Used to work for [employer]. Lives with parents, no relationships. 4 kids: 1 in [place], others out of state.  Smokes 1.5 ppd x 35 yrs. 3-4 ETOH per day. Works out Engineer, manufacturing. Healthy diet.  Review of Systems       The patient complains of severe indigestion/heartburn.  The patient denies anorexia, fever, weight loss, weight gain, vision loss, decreased hearing, hoarseness, chest pain, syncope, dyspnea on exertion, peripheral edema, prolonged cough, headaches, hemoptysis, abdominal pain, melena, and hematochezia.    Physical Exam  General:  alert, well-developed, well-nourished, and well-hydrated.   Head:  normocephalic and atraumatic.   Eyes:  pupils equal, pupils round, and pupils reactive to light.   Ears:  EACs patent; TMs translucent and gray with good cone of light and bony landmarks.  Nose:  no nasal discharge.   Mouth:  good dentition and pharynx pink and  moist.   Neck:  no masses.  no audible carotid bruits Lungs:  Normal respiratory effort, chest expands symmetrically. Lungs are clear to auscultation, no crackles or wheezes. Heart:  Normal rate and regular rhythm. S1 and S2 normal without gallop, murmur, click, rub or other extra sounds. Abdomen:  Bowel sounds positive,abdomen soft and non-tender without masses, organomegaly, no AA bruits Rectal:  hemoccult neg, no external abnormalities, no hemorrhoids, and normal sphincter tone.   Prostate:  Prostate gland firm and smooth, no enlargement, nodularity, tenderness, mass, asymmetry or induration. Pulses:  2+ radial and pedal pulses Extremities:  no LE edema Skin:  color normal.   Cervical Nodes:  No lymphadenopathy noted Psych:  good eye contact, not anxious appearing, and flat affect.     Impression & Recommendations:  Problem # 1:  HEALTH MAINTENANCE EXAM (ICD-V70.0) Keeping healthy checklist for men reviewed. BP at goal.  BMI at goal. Update fastng labs. DRE + PSA today. Colonoscopy due 2013. Flu shot today. Work on [employer], regular exercise, MVI , ASA 81 mg/ day. EKG today for baseline:NSR at 76 bpm, PR 156 ms, QTc 400 msec, no ST changes, TWI only in AVL.   UA normal.  Start Seroquel at night in place of lunesta for worsening sleep/ mood. F/U in 4 wks.  Call if any problems.  Complete Medication List: 1)  Seroquel Xr 50 Mg Xr24h-tab (Quetiapine fumarate) .[name].. 1 tab by mouth at bedtime 2)  Tramadol Hcl 50 Mg Tabs (Tramadol hcl) .... Take 1-2 tablet by mouth every 8 hours 3)  Alprazolam 0.5 Mg Tabs (Alprazolam) .... Take 1/2 tablet by mouth bid and 1 tab po qhs. 4)  Omeprazole 40 Mg Cpdr (Omeprazole) .[name].. 1 tab by mouth daily; take 30 min before breakfast 5)  Multivitamins Tabs (Multiple vitamin) .... Take 1 tablet by mouth once a day 6)  Proair Hfa 108 (90 Base) Mcg/act Aers (Albuterol sulfate) .... 2 puffs inhaled every 4-6 hours as needed 7)  Finasteride 5 Mg Tabs  (Finasteride) .... Take 1 tablet by mouth once a day 8)  Fish Oil 1000 Mg Caps (Omega-3 fatty acids)  Other Orders: UA Dipstick w/o Micro (automated)  (81003) Flu Vaccine 3yrs + MEDICARE PATIENTS (Z6109) Administration Flu vaccine - MCR (U0454) T-Comprehensive Metabolic Panel (09811-91478) T-Lipid Profile (29562-13086) T-PSA (57846-96295) DRE (M8413) EKG w/ Interpretation (93000)  Patient Instructions: 1)  Change Lunesta to Seroquel XR 50 mg - take 1 tab at 8 pm each night.  This should help with mood and sleep. 2)  Cut back on smoking and drinking. 3)  Update fasting labs. 4)  Will call you w/ results tomorrow. 5)  EKG today. 6)  Flu shot today. 7)  Return for f/u mood/ sleep  in 4 wks. Prescriptions: OMEPRAZOLE 40 MG CPDR (OMEPRAZOLE) 1 tab by mouth daily; take 30 min before breakfast  #30 x 12   Entered and Authorized by:   Seymour Bars DO   Signed by:   Seymour Bars DO on 10/15/2009   Method used:   Electronically to        [facility] [phone]* (retail)       [address] [place], [place]  [address]       Ph: [phone]       Fax: [phone]   RxID:   [id number] SEROQUEL XR 50 MG XR24H-TAB (QUETIAPINE FUMARATE) 1 tab by mouth at bedtime  #30 x 2   Entered and Authorized by:   Seymour Bars DO   Signed by:   Seymour Bars DO on 10/15/2009   Method used:   Electronically to        [facility] [phone]* (retail)       [address] [place], [place]  [address]       Ph: [phone]       Fax: [phone]   RxID:   [id number]   Laboratory Results   Urine Tests    Routine Urinalysis   Color: yellow Appearance: Clear Glucose: negative   (Normal Range: Negative) Bilirubin: negative   (Normal Range: Negative) Ketone: negative   (Normal Range: Negative) Spec. Gravity: 1.010   (Normal Range: 1.003-1.035) Blood: negative   (Normal Range: Negative) pH: 6.0   (Normal Range: 5.0-8.0) Protein: negative   (Normal Range: Negative) Urobilinogen: 0.2    (Normal Range: 0-1) Nitrite: negative   (Normal Range: Negative) Leukocyte Esterace: negative   (Normal Range: Negative)      Flu Vaccine Consent Questions     Do you have a history of severe allergic reactions to this vaccine? no    Any prior history of allergic reactions to egg and/or gelatin? no    Do you have a sensitivity to the preservative Thimersol? no    Do you have a past history of Guillan-Barre Syndrome? no    Do you currently have an acute febrile illness? no    Have you ever had a severe reaction to latex? no    Vaccine information given and explained to patient? yes    Are you currently pregnant? no    Lot Number:[id number]   Exp Date:07/11/2010   Site Given  Left Deltoid IMu

## 2010-02-13 NOTE — Letter (Signed)
Summary: Depression Questionnaire  Depression Questionnaire   Imported By: Lanelle Bal 11/26/2009 10:16:11  _____________________________________________________________________  External Attachment:    Type:   Image     Comment:   External Document

## 2010-02-23 ENCOUNTER — Telehealth (INDEPENDENT_AMBULATORY_CARE_PROVIDER_SITE_OTHER): Payer: Self-pay | Admitting: *Deleted

## 2010-02-23 ENCOUNTER — Encounter: Payer: Self-pay | Admitting: Family Medicine

## 2010-02-23 ENCOUNTER — Ambulatory Visit (INDEPENDENT_AMBULATORY_CARE_PROVIDER_SITE_OTHER): Payer: Self-pay

## 2010-02-23 DIAGNOSIS — I10 Essential (primary) hypertension: Secondary | ICD-10-CM | POA: Insufficient documentation

## 2010-02-27 ENCOUNTER — Encounter (HOSPITAL_COMMUNITY): Payer: Self-pay | Admitting: Licensed Clinical Social Worker

## 2010-03-04 NOTE — Assessment & Plan Note (Signed)
Summary: BP check   Vital Signs:  Patient profile:   60 year old male O2 Sat:      98 % on Room air Pulse rate:   99 / minute BP sitting:   157 / 98  (left arm) Cuff size:   large  Vitals Entered By: Payton Spark CMA (February 23, 2010 1:46 PM)  O2 Flow:  Room air CC: F/U HTN   CC:  F/U HTN.  Allergies: 1)  ! Pcn 2)  ! Amoxicillin 3)  ! Pravachol   Complete Medication List: 1)  Tramadol Hcl 50 Mg Tabs (Tramadol hcl) .... Take 1-2 tablet by mouth every 8 hours 2)  Alprazolam 0.5 Mg Tabs (Alprazolam) .... Take 1/2 tablet by mouth bid and 1 tab po qhs. 3)  Omeprazole 40 Mg Cpdr (Omeprazole) .[name].. 1 tab by mouth daily; take 30 min before breakfast 4)  Multivitamins Tabs (Multiple vitamin) .... Take 1 tablet by mouth once a day 5)  Proair Hfa 108 (90 Base) Mcg/act Aers (Albuterol sulfate) .... 2 puffs inhaled every 4-6 hours as needed 6)  Finasteride 5 Mg Tabs (Finasteride) .... Take 1 tablet by mouth once a day 7)  Fish Oil 1000 Mg Caps (Omega-3 fatty acids) 8)  Lunesta 3 Mg Tabs (Eszopiclone) .... Take 1 tab by mouth at bedtime 9)  Diovan 80 Mg Tabs (Valsartan) .[name].. 1 tab by mouth daily  Patient Instructions: 1)  BP still high. 2)  Add Diovan 80 mg once daily.  Take with dinner each day. 3)  Return for follow up appt in 6 wks. Prescriptions: DIOVAN 80 MG TABS (VALSARTAN) 1 tab by mouth daily  #30 x 2   Entered and Authorized by:   Seymour Bars DO   Signed by:   Seymour Bars DO on 02/23/2010   Method used:   Electronically to        [facility] [phone]* (retail)       [address] [place], [place]  [address]       Ph: [phone]       Fax: [phone]   RxID:   [id number]    Orders Added: 1)  Est. Patient Level I [24401]     CC: Follow-up HTN    HPI: Taking meds? n/a Side effects? n/a Chest pain, SOB, Dizziness? n/a  A/P: HTN (401.1) At goal? If no, Patient will be notified.  5 minutes was spent with patient.

## 2010-03-04 NOTE — Progress Notes (Signed)
Summary: 90 day supply on all meds starting in March  Phone Note Call from Patient

## 2010-03-11 ENCOUNTER — Encounter (INDEPENDENT_AMBULATORY_CARE_PROVIDER_SITE_OTHER): Payer: Medicare Other | Admitting: Licensed Clinical Social Worker

## 2010-03-11 DIAGNOSIS — F4323 Adjustment disorder with mixed anxiety and depressed mood: Secondary | ICD-10-CM

## 2010-04-30 ENCOUNTER — Encounter (HOSPITAL_COMMUNITY): Payer: Medicare Other | Admitting: Licensed Clinical Social Worker

## 2010-05-19 ENCOUNTER — Other Ambulatory Visit: Payer: Self-pay | Admitting: Family Medicine

## 2010-05-23 ENCOUNTER — Encounter: Payer: Self-pay | Admitting: Family Medicine

## 2010-05-25 ENCOUNTER — Encounter: Payer: Self-pay | Admitting: Family Medicine

## 2010-05-25 ENCOUNTER — Ambulatory Visit (INDEPENDENT_AMBULATORY_CARE_PROVIDER_SITE_OTHER): Payer: Medicare Other | Admitting: Family Medicine

## 2010-05-25 VITALS — BP 134/86 | HR 99 | Ht 68.5 in | Wt 159.0 lb

## 2010-05-25 DIAGNOSIS — I10 Essential (primary) hypertension: Secondary | ICD-10-CM

## 2010-05-25 DIAGNOSIS — L559 Sunburn, unspecified: Secondary | ICD-10-CM

## 2010-05-25 NOTE — Patient Instructions (Signed)
Update labs today. Will call you w/ results tomorrow.  Stay on Diovan 80 mg/ day Call me to RF all RX's for 90 day supply on the 1st (will keep Diovan at 160 mg dose so you can cut in half).  Return for f/u in 4 mos.

## 2010-05-25 NOTE — Progress Notes (Signed)
  Subjective:    Patient ID: Jimmy Mayo, male    DOB: 1951/01/02, 60 y.o.   MRN: [id number]  HPI 60 yo WM presents for f/u HTN.  Last visit in Feb, his BP was high and we added Diovan 80 mg/ day.  Doing well on it.  Due for BMP today.  Denies CP, DOE, edema or heart palpitations.  He does continue to smoke.  Notes his skin to look red but has not spent much time in the sun recently.  He is a little itchy and dry. Using a moisturizing lotion.  Denies rash.  [facility]   BP 134/86  Pulse 99  Ht 5' 8.5" (1.74 m)  Wt 159 lb (72.122 kg)  BMI 23.82 kg/m2  SpO2 99%     Review of Systems  Constitutional: Negative for fatigue.  Respiratory: Negative for cough, chest tightness and shortness of breath.   Cardiovascular: Negative for chest pain, palpitations and leg swelling.  Skin: Positive for color change.       Objective:   Physical Exam  Constitutional: He appears well-developed and well-nourished.  Neck: Normal range of motion. Neck supple.  Cardiovascular: Normal rate, regular rhythm and normal heart sounds.   Pulmonary/Chest: Effort normal and breath sounds normal. No respiratory distress. He has no wheezes.  Musculoskeletal: He exhibits no edema.  Skin: Skin is warm and dry.       Appears mildly sunburned all over  Psychiatric: He has a normal mood and affect.          Assessment & Plan:

## 2010-05-25 NOTE — Assessment & Plan Note (Signed)
BP has improved and he is tolerating Diovan well.  He asked about changing to losartan but since he is doing so well, I will write him for the 160s and he can cut them in half.  Update BMP and CBC today.

## 2010-05-25 NOTE — Assessment & Plan Note (Signed)
Pt either has a sunburn from the weekend or has skin irritation from [facility].  Continue to use Vasoline with Aloe Vera. None of his meds cause photosensitivity.  [facility] and will avoid the hot tub for a few days.

## 2010-05-26 ENCOUNTER — Telehealth: Payer: Self-pay | Admitting: Family Medicine

## 2010-05-26 LAB — CBC WITH DIFFERENTIAL/PLATELET
Eosinophils Relative: 2 % (ref 0–5)
HCT: 48.2 % (ref 39.0–52.0)
Lymphocytes Relative: 30 % (ref 12–46)
Lymphs Abs: 2.2 10*3/uL (ref 0.7–4.0)
MCV: 93.1 fL (ref 78.0–100.0)
Neutro Abs: 4.2 10*3/uL (ref 1.7–7.7)
Platelets: 222 10*3/uL (ref 150–400)
RBC: 5.18 MIL/uL (ref 4.22–5.81)
WBC: 7.3 10*3/uL (ref 4.0–10.5)

## 2010-05-26 LAB — BASIC METABOLIC PANEL WITH GFR
CO2: 27 mEq/L (ref 19–32)
Chloride: 100 mEq/L (ref 96–112)
Creat: 0.91 mg/dL (ref 0.40–1.50)
Potassium: 4.8 mEq/L (ref 3.5–5.3)

## 2010-05-26 NOTE — Procedures (Signed)
NAMEMARQUEE, FUCHS NO.:  [id number]   MEDICAL RECORD NO.:  [id number]          PATIENT TYPE:  REC   LOCATION:  [facility]                         FACILITY:  [facility]   PHYSICIAN:  Erick Colace, M.D.DATE OF BIRTH:  [birth year]   DATE OF PROCEDURE:  07/28/2007  DATE OF DISCHARGE:                               OPERATIVE REPORT   PROCEDURE:  Acupuncture treatment, needles placed.   INDICATIONS:  Lumbar post laminectomy pain as well as cervicalgia due to  myofascial pain.   Needles placed bilateral BL-23, BL-52, DU-4, DU-14, bilateral GB, as in  gallbladder 20, bilateral BL-27, and bilateral GB-30 at 4 Hz stim,  alternating with 20 at the GB-22, SI-50 needles, and at 4 Hz continuous  at the other needles.  The patient tolerated the procedure well.   Repeat in one month.   We will see him in about 2 weeks at which time we will follow up on his  TENS evaluation as well as doing other acupuncture treatments.      Erick Colace, M.D.  Electronically Signed     AEK/MEDQ  D:  07/28/2007 12:06:32  T:  07/29/2007 04:04:04  Job:  [id number]

## 2010-05-26 NOTE — Telephone Encounter (Signed)
Pls let pt know that his blood counts and chemistry panel came back normal.  Continue current meds.

## 2010-05-26 NOTE — Procedures (Signed)
[name], [name] NO.:  [id number]   MEDICAL RECORD NO.:  [id number]            PATIENT TYPE:   LOCATION:                                 FACILITY:   PHYSICIAN:  Erick Colace, M.D.   DATE OF BIRTH:   DATE OF PROCEDURE:  06/30/2007  DATE OF DISCHARGE:                               OPERATIVE REPORT   Jimmy Mayo returns today.  This is acupuncture treatment, last  treatment performed on Jun 02, 2007.  Needles were placed at bilateral  BL23, BL25, BL27, GB30, bilateral GB31, bilateral GB20, and CV14, E-  Stim.  Additionally, needles placed at [facility] and BL16 bilaterally.  E-  Stim 4 Hz alternating with 20.  Treatment time 30 minutes.  The patient  tolerated the procedure well.  Repeat in 1 month.   Discussed other treatment options including TENS unit.  We will send him  to PT for a TENS eval.      Erick Colace, M.D.  Electronically Signed     AEK/MEDQ  D:  06/30/2007 12:15:01  T:  07/01/2007 03:17:50  Job:  [id number]

## 2010-05-26 NOTE — Procedures (Signed)
Jimmy Mayo, Jimmy Mayo NO.:  [id number]   MEDICAL RECORD NO.:  [id number]            PATIENT TYPE:   LOCATION:                                 FACILITY:   PHYSICIAN:  Erick Colace, M.D.   DATE OF BIRTH:   DATE OF PROCEDURE:  03/31/2007  DATE OF DISCHARGE:                               OPERATIVE REPORT   PROCEDURE:  Acupuncture treatment.   Needles placed bilateral at BL21, BL23, BL25, BL27, GB30, BL10, SI14, 4  Hz Stim x30 minutes.  In addition, DU4 and DU14 placed.  No E-Stim on  these.  The patient tolerated the procedure well.  Repeat treatment in  one month.      Erick Colace, M.D.  Electronically Signed     AEK/MEDQ  D:  03/31/2007 11:38:27  T:  03/31/2007 11:52:20  Job:  [id number]

## 2010-05-26 NOTE — Procedures (Signed)
Jimmy Mayo, Jimmy Mayo NO.:  [id number]   MEDICAL RECORD NO.:  [id number]         PATIENT TYPE:  [facility]REC   LOCATION:                                 FACILITY:   PHYSICIAN:  Erick Colace, M.D.DATE OF BIRTH:  Aug 13, 1950   DATE OF PROCEDURE:  09/01/2007  DATE OF DISCHARGE:                               OPERATIVE REPORT   This is an acupuncture treatment for the indication of lumbar  postlaminectomy pain as well as cervicalgia due to myofascial pain.   Needles were placed at bilateral BL 21, BL 27, as well as bilateral GB  20, bilateral SI 13, and bilateral BL 11.  Estim 4 Hz x 30 minutes.  The  patient tolerated the procedure well.   POST INJECTION INSTRUCTION:  The patient's TENS units has been helpful  for him.  We will authorize for purchase given that he has had a trial  showing adequate benefits.      Erick Colace, M.D.  Electronically Signed     AEK/MEDQ  D:  09/01/2007 16:54:06  T:  09/02/2007 05:04:12  Job:  [id number]

## 2010-05-26 NOTE — Procedures (Signed)
NAME:  Jimmy Mayo, Jimmy Mayo NO.:  [id number]   MEDICAL RECORD NO.:  [id number]            PATIENT TYPE:   LOCATION:                                 FACILITY:   PHYSICIAN:  Erick Colace, M.D.   DATE OF BIRTH:   DATE OF PROCEDURE:  DATE OF DISCHARGE:                               OPERATIVE REPORT   Acupuncture treatment for treatment lumbar postlaminectomy pain syndrome  as well as cervicalgia due to myofascial pain syndrome, some increasing  myofascial pain right shoulder area.   Needles placed bilaterally at BL 21, BL 23, BL 25, BL 27, GB 21, and SI  13, as well as right SI 14, right SI 11.  On the right side, 5 Hz  alternating with 20, and on the left side 4 Hz steady at 30 minutes.  The patient tolerated the procedure well.  See him back in 1 month.      Erick Colace, M.D.  Electronically Signed     AEK/MEDQ  D:  06/14/2008 12:13:58  T:  06/15/2008 46:27:03  Job:  [id number]

## 2010-05-26 NOTE — Procedures (Signed)
NAMEMARVON, SHILLINGBURG NO.:  [id number]   MEDICAL RECORD NO.:  [id number]          PATIENT TYPE:  REC   LOCATION:  [facility]                         FACILITY:  [facility]   PHYSICIAN:  Erick Colace, M.D.DATE OF BIRTH:  07/10/1950   DATE OF PROCEDURE:  DATE OF DISCHARGE:  09/21/2007                               OPERATIVE REPORT   This is an acupuncture treatment.   INDICATION:  Lumbar postlaminectomy pain syndrome as well as cervicalgia  due to myofascial pain.   Needles placed bilateral BL21, BL23, BL25, BL27, as well as bilateral SI  13, bilateral BL11, and bilateral GB21.  4 Hz stimulation x30 minutes.  The patient tolerated the procedure well.  Post-injection structures  given.      Erick Colace, M.D.  Electronically Signed     AEK/MEDQ  D:  09/29/2007 15:37:33  T:  09/30/2007 04:32:32  Job:  [id number]

## 2010-05-26 NOTE — Procedures (Signed)
NAME:  Jimmy Mayo, Jimmy Mayo NO.:  [id number]   MEDICAL RECORD NO.:  [id number]           PATIENT TYPE:   LOCATION:                                 FACILITY:   PHYSICIAN:  Erick Colace, M.D.   DATE OF BIRTH:   DATE OF PROCEDURE:  DATE OF DISCHARGE:                               OPERATIVE REPORT   This is an acupuncture treatment for lumbar post laminectomy syndrome as  well as cervicalgia due to myofascial pain syndrome.   Needles placed bilaterally BL 21, BL 23, BL 25, BL 27 as well as  bilateral BL 11, bilateral GB 20 and right GB 21, right SI 11, 4-Hz  stimulation, 30 minutes.  The patient tolerated the procedure well.  We  will see him back in 1 month.      Erick Colace, M.D.  Electronically Signed     AEK/MEDQ  D:  03/15/2008 11:33:20  T:  03/16/2008 03:21:57  Job:  [id number]

## 2010-05-26 NOTE — Procedures (Signed)
NAMEDARIL, WARGA NO.:  [id number]   MEDICAL RECORD NO.:  [id number]            PATIENT TYPE:   LOCATION:                                 FACILITY:   PHYSICIAN:  Erick Colace, M.D.   DATE OF BIRTH:   DATE OF PROCEDURE:  DATE OF DISCHARGE:                               OPERATIVE REPORT   PROCEDURE:  Acupuncture treatment.   Needles placed bilateral BL21, BL23, BL25, BL27, GB30; bilateral at  GB21, bilateral SI14, and left BL10 and left GB20.  E-stem 4 hertz  alternating with 20 hertz in the BL10, GB20, GB21 needles and at 4 hertz  at the BL21, BL23, BL25, BL27, GB30 needles.  Treatment time 30 minutes.   The patient tolerated the procedure well.  Repeat in 1 month.      Erick Colace, M.D.  Electronically Signed     AEK/MEDQ  D:  06/02/2007 11:47:03  T:  06/02/2007 12:01:16  Job:  [id number]

## 2010-05-26 NOTE — Procedures (Signed)
Jimmy Mayo, BEVERLIN NO.:  [id number]   MEDICAL RECORD NO.:  [id number]           PATIENT TYPE:   LOCATION:                                 FACILITY:   PHYSICIAN:  Erick Colace, M.D.DATE OF BIRTH:  1950-11-10   DATE OF PROCEDURE:  12/22/2007  DATE OF DISCHARGE:                               OPERATIVE REPORT   Acupuncture treatment for the lumbar post laminectomy pain syndrome as  well as cervicalgia due to myofascial pain syndrome.   Needles placed bilaterally BL 21, BL 23, BL 25, BL 27, bilateral SI 13,  bilateral BL 11, and bilateral GB 21.  A 4 Hz stimulation x30 minutes.  The patient tolerated the procedure well.  Post injection instructions  given.  Followup on a p.r.n. basis.      Erick Colace, M.D.  Electronically Signed     AEK/MEDQ  D:  12/22/2007 16:49:27  T:  12/23/2007 04:54:09  Job:  [id number]

## 2010-05-26 NOTE — Procedures (Signed)
[name], Jimmy Mayo NO.:  [id number]   MEDICAL RECORD NO.:  [id number]            PATIENT TYPE:   LOCATION:                                 FACILITY:   PHYSICIAN:  Erick Colace, M.D.   DATE OF BIRTH:   DATE OF PROCEDURE:  DATE OF DISCHARGE:                               OPERATIVE REPORT   PROCEDURE:  Acupuncture treatment.   Needles were placed bilaterally at BL21, BL23, BL25, GB30, and also  bilaterally at BL10, GB20, SI14 and midline DU4, DU14, E-Stim 4 Hz  alternating with 20 in the upper needles and in the lumbar needles, 4 Hz  x30 minutes.  The patient tolerated the procedure well.  Repeat in one  month.      Erick Colace, M.D.  Electronically Signed     AEK/MEDQ  D:  04/28/2007 12:03:56  T:  04/28/2007 12:26:17  Job:  [id number]

## 2010-05-26 NOTE — Telephone Encounter (Signed)
LMOM informing Pt of the above 

## 2010-05-26 NOTE — Procedures (Signed)
NAMEDERRIL, FRANEK NO.:  [id number]   MEDICAL RECORD NO.:  [id number]          PATIENT TYPE:  AMB   LOCATION:  [facility]                          FACILITY:  [facility]   PHYSICIAN:  Erick Colace, M.D.DATE OF BIRTH:  [birth year]   DATE OF PROCEDURE:  DATE OF DISCHARGE:  08/24/2007                               OPERATIVE REPORT   Acupuncture treatment.   Lumbar post laminectomy syndrome, as well as cervicalgia due to  myofascial pain syndrome, and possible cervical facet syndrome.   Needles placed bilateral at BL21, BL23, BL25, and BL27 as well as  bilateral SI13 and bilateral BL11 and bilateral GB21as well as 2 points  placed in the right SI11, 4-HZ stimulation x30 minutes.  The patient  tolerated the procedure well.  I will see him back in approximately 1  month.      Erick Colace, M.D.  Electronically Signed     AEK/MEDQ  D:  02/09/2008 11:33:24  T:  02/10/2008 02:47:46  Job:  [id number]

## 2010-05-26 NOTE — Procedures (Signed)
NAME:  Jimmy Mayo, Jimmy Mayo NO.:  [id number]   MEDICAL RECORD NO.:  [id number]            PATIENT TYPE:   LOCATION:                                 FACILITY:   PHYSICIAN:  Erick Colace, M.D.   DATE OF BIRTH:   DATE OF PROCEDURE:  DATE OF DISCHARGE:                               OPERATIVE REPORT   A 60 year old male with lumbar postlaminectomy syndrome as well as  cervicalgia due to myofascial pain syndrome.   Needles placed bilaterally at BL 21, BL 23, BL 25, BL 27, SI 3, and BL  11.  Bilateral GB 21, 4 Hz 10 x30 minutes in the back and then in the  cervical area 20 Hz alternating with 5 Hz x30 minutes.  The patient  tolerated the procedure well.  I will see him back in 1-2 months.      Erick Colace, M.D.  Electronically Signed     AEK/MEDQ  D:  04/19/2008 12:05:36  T:  04/20/2008 10:40:29  Job:  [id number]

## 2010-05-29 NOTE — H&P (Signed)
NAME:  Jimmy Mayo, Jimmy Mayo                           ACCOUNT NO.:  [id number]   MEDICAL RECORD NO.:  [id number]                   PATIENT TYPE:  INP   LOCATION:  3012                                 FACILITY:  [facility]   PHYSICIAN:  Payton Doughty, M.D.                   DATE OF BIRTH:  14-Sep-1950   DATE OF ADMISSION:  12/29/2001  DATE OF DISCHARGE:                                HISTORY & PHYSICAL   ADMITTING DIAGNOSIS:  Spondylosis L4-5 and L5-1.   HISTORY OF PRESENT ILLNESS:  The patient is a 60 year old right-handed white  gentleman with back pain in the past who had been on a very rigorous  exercise physical therapy maintenance program, doing well, in June had some  back pain.  He had an MR that showed degenerative change.  He had been doing  relatively well and then had an increase in his low back pain and underwent  diskography that was positive at L4-5 and L5-S1 and he is now admitted for  fusion.   MEDICAL HISTORY:  Pain in his back and down his left leg worsened with  activity.  He does not describe any incontinent difficulties.  Medical  history is remarkable for hypercholesterolemia and GI difficulties for which  he has gone to visit another doctor, he had sinus problems in the past,  initially his chest x-ray showed a questionable mass, CT showed questionable  lymphadenopathy but no tumor.   CURRENT MEDICATIONS:  Takes Flonase, Tricor, and Nexium.   ALLERGIES:  He does not note any medical allergies.   SURGICAL HISTORY:  Knee and sinus operation 10 years ago.   SOCIAL HISTORY:  He smokes a pack and a half of cigarettes a day and drinks  alcohol socially and works at [employer].   FAMILY HISTORY:  Both parents are 105 in reasonable health; mother had colon  cancer; his father had diabetes, prostate cancer, and hypertension.   REVIEW OF SYSTEMS:  Nasal congestion, sinus problems, hypercholesterolemia,  indigestion, abdominal pain, leg weakness, back pain, leg pain,  arthritis,  and neck pain.  He has occasional inability to concentrate and difficulty  with coordination, anxiety and depression.   PHYSICAL EXAMINATION:  HEENT:  Within normal limits.  NECK:  He has reasonable range of motion of his neck.  CHEST:  Diffuse crackles and wheezes.  CARDIAC:  Regular rate and rhythm without murmur.  ABDOMEN:  Nontender; no hepatosplenomegaly.  EXTREMITIES:  Without clubbing or cyanosis.  GU:  Deferred.  VASCULAR:  Peripheral pulses are good.  NEUROLOGICAL:  He is awake, alert, and oriented.  His cranial nerves are  intact.  Motor exam shows 5/5 strength throughout the upper and lower  extremities.  Pushing and pulling cause him to have low back pain.  There is  no sensory deficit described.  Reflexes are 1 at the right knee, 6 at the  left,  1 at the ankles bilaterally.  Straight-leg raise is positive.  He can  only bend forward about 20 degrees without back pain.   DISKOGRAPHY:  Diskography results have been reviewed above.   CLINICAL IMPRESSION:  Lumbar spondylosis at L4-5 and L5-S1.   PLAN:  Lumbar laminectomy, diskectomy, posterior lumbar interbody fusion,  segmental fixation at L4-5 and L5-S1.  The risks and benefits of this  approach have been discussed with him and he wishes to proceed.                                               Payton Doughty, M.D.    MWR/MEDQ  D:  12/29/2001  T:  12/30/2001  Job:  [id number]

## 2010-05-29 NOTE — Op Note (Signed)
NAME:  Jimmy Mayo, Jimmy Mayo                           ACCOUNT NO.:  [id number]   MEDICAL RECORD NO.:  [id number]                   PATIENT TYPE:  INP   LOCATION:  3012                                 FACILITY:  [facility]   PHYSICIAN:  Payton Doughty, M.D.                   DATE OF BIRTH:  14-Aug-1950   DATE OF PROCEDURE:  12/29/2001  DATE OF DISCHARGE:                                 OPERATIVE REPORT   PREOPERATIVE DIAGNOSES:  1. Spondylosis, L5-S1.  2. Spondylolysis with spondylolisthesis at L4-5.   PROCEDURE:  L4-5, L5-S1 laminectomy, diskectomy, posterior lumbar interbody  fusion with segmental pedicle fixation from L4 to S1 and posterolateral  arthrodesis from L4 to S1.   SURGEON:  Payton Doughty, M.D.   ASSISTANTS:  Nurse assistant:  Basilia Jumbo.  Doctor assistant:  Hilda Lias, M.D.   ANESTHESIA:  General endotracheal.   PREPARATION:  Sterile Betadine prep and scrub with alcohol wipe.   COMPLICATIONS:  None.   DESCRIPTION OF PROCEDURE:  A 60 year old gentleman with severe degenerative  spondylosis at L4-5 and L5-S1.  He was taken to the operating room and  smoothly anesthetized and intubated, placed prone on the operating room  table.  Following shave, prep, and drape in the usual sterile fashion, the  skin was infiltrated with 1% lidocaine and 1:400,000 epinephrine.  Skin was  incised from mid-L3 to mid-S1 and the transverse processes, laminae, and  facet joints at L4, L5, and S1 were exposed bilaterally.  Intraoperative x-  ray confirmed correctness of level.  Having confirmed correctness of level,  the pars interarticularis, lamina, and inferior facet of L4 and L5 and the  superior facet of L5 and S1 were removed bilaterally using the high-speed  drill.  The bone was set aside for grafting.  The ligamentum flavum was  removed, and this allowed decompression of the L4, L5, and S1 nerve roots  bilaterally.  At both 4-5 and 5-1, there was severe degenerative disease but  it was much  more pronounced at 4-5 with grade 1 spondylolisthesis of 4 on 5.  The right-sided nerve roots were severely compressed.  Following complete  decompression of the nerve roots, the wound was irrigated and hemostasis  assured.  Ray Threaded Fusion Cages were then placed, 12 x 21 at 5-1, 14 x  21 at 4-5.  The cages were packed with bone graft harvested from the facet  joints and capped.  Pedicle screws were then placed at L4, L5, and S1 using  the standard landmarks, and intraoperative x-ray showed good placement of  pedicle screws and Ray Cages.  The cages were linked with the rods and the  caps tightened.  Intertransverse bone was then placed at L4-5 and L5-S1  after decorticating the transverse processes with a high-speed drill.  Intraoperative x-ray showed good placement of Ray Cages, pedicle screws and  rods, and bone graft.  The fascia was reapproximated with 0 Vicryl in  interrupted fashion, the subcutaneous tissue was reapproximated with 0  Vicryl in interrupted fashion.  Subcuticular tissue was reapproximated with  3-0 Vicryl in  interrupted fashion.  The skin was closed with 3-0 nylon in a running locked  fashion.  A bacitracin and Telfa dressing was applied and made occlusive  with OpSite and the patient returned to the recovery room in good condition.                                               Payton Doughty, M.D.    MWR/MEDQ  D:  12/29/2001  T:  12/31/2001  Job:  [id number]

## 2010-05-29 NOTE — Discharge Summary (Signed)
   NAME:  Jimmy Mayo, Jimmy Mayo                           ACCOUNT NO.:  [id number]   MEDICAL RECORD NO.:  [id number]                   PATIENT TYPE:  INP   LOCATION:  3012                                 FACILITY:  [facility]   PHYSICIAN:  Payton Doughty, M.D.                   DATE OF BIRTH:  [birth year]   DATE OF ADMISSION:  12/29/2001  DATE OF DISCHARGE:  01/01/2002                                 DISCHARGE SUMMARY   ADMISSION DIAGNOSIS:  Spondylosis of L4-5 and L5-S1.   DISCHARGE DIAGNOSIS:  Spondylosis of L4-5 and L5-S1.   OPERATIVE PROCEDURES:  L4-5 and L5-S1 laminectomy, diskectomy, posterior  lumbar interbody fusion with Ray fusion cages, segmental pedicle fixation  from L4 to S1, and posterolateral arthrodesis from L4 to S1.   COMPLICATIONS:  None.   DISCHARGE STATUS:  Alive and well.   HISTORY OF PRESENT ILLNESS:  December 21, 2003A 60 year old, right-handed,  white gentleman whose history and physical is recounted on the chart.  He  has had lumbar pain for an extended period of time.  His films have shown  spondylosis with spondylolisthesis at L4-5 and positive discography.  General health is unremarkable save for questionable chest x-ray which  demonstrated some lymphadenopathy which is being followed on CT.   PHYSICAL EXAMINATION:  The neurologic exam is intact.   HOSPITAL COURSE:  He was admitted after ascertainment of normal laboratory  values and underwent an L4-5 and L5-S1 fusion as noted above.  Postoperatively, he has done well.  He did physical therapy the first  postoperative day.  The Foley was removed.  The PCA was stopped the second  postoperative day.  He is eating and voiding normally.  Strength is full.  His incision is dry.   DISPOSITION:  He is being discharged home to the care of his family.   FOLLOW-UP:  Follow-up will be in the [facility]  office in two weeks.   DISCHARGE MEDICATIONS:  1. Flexeril.  2. Vicodin.                               Payton Doughty, M.D.    MWR/MEDQ  D:  01/01/2002  T:  01/01/2002  Job:  [id number]

## 2010-05-29 NOTE — Op Note (Signed)
Jimmy Mayo, Jimmy Mayo NO.:  [id number]   MEDICAL RECORD NO.:  [id number]          PATIENT TYPE:  AMB   LOCATION:  [facility]                         FACILITY:  [facility]   PHYSICIAN:  Jamison Neighbor, M.D.  DATE OF BIRTH:  1950/07/17   DATE OF PROCEDURE:  09/29/2004  DATE OF DISCHARGE:                                 OPERATIVE REPORT   PREOPERATIVE DIAGNOSIS:  Spermatocele.   POSTOPERATIVE DIAGNOSIS:  Spermatocele plus multiloculated hydrocele of the  cord.   PROCEDURE:  Excision of spermatocele and hydrocele.   SURGEON:  Jamison Neighbor, M.D.   ANESTHESIA:  General.   COMPLICATIONS:  None.   DRAINS:  Penrose.   HISTORY:  This is a 60 year old male who developed a large mass above the  right testicle.  This appeared to be most consistent with a spermatocele.  The patient is now to undergo excision of this mass.  He understands the  risks and benefits of the procedure and gave full informed consent.   PROCEDURE:  After successful induction of general anesthesia, the patient  was placed in the supine position, prepped with Betadine, and draped in the  usual sterile fashion.  A vertical incision was made in the right  hemiscrotum and was carried down to the dartos layer and the layers of the  scrotum until the fluid-filled sac could be identified.  This appeared to be  consistent with a hydrocele of the cord and not so much a hydrocele around  the testicle proper.  The testicle was dissected away from the cord itself  so that it could be elevated.  This included taking down the gubernaculum  which was done with electrocautery.  The sac was opened and it turned out to  be a multiloculated structure involving the cord as well as a portion of the  epididymis.  A portion of the epididymis including the spermatocele was  excised and all of the cysts were opened and all of the cyst wall was  removed.  This was not a standard hydrocele that could be wrapped around the  testicle so the decision was made to remove as much as the sac from the cord  as possible, cauterize all edges in order to prevent recurrence or bleeding.  The cord structures themselves were identified and were preserved.  The  patient's vas was identified where it had been previously clipped and this  area was transected as part of the removal of that portion of the epididymis  that was involved in this spermatocele structure.  After the excision had  been completed and all edges had been cauterized, the area was inspected.  The testicle itself was fine.  The cord structures had been preserved and  there was no residual hydrocele sac or spermatocele of any kind.  The  testicle was returned back into the right hemiscrotum.  A Penrose drain was  placed through a small stab incision in the most dependent portion of the  right hemiscrotum.  The  incision was then closed with a running suture of chromic in the dartos  layer  including some of the deeper layers and a second running suture of  chromic on the skin.  The patient had Collodium applied as well as Floxin  and scrotal support.  He tolerated the procedure well and was taken to the  recovery room in good condition.           ______________________________  Jamison Neighbor, M.D.  Electronically Signed     RJE/MEDQ  D:  09/29/2004  T:  09/29/2004  Job:  [id number]

## 2010-06-10 ENCOUNTER — Other Ambulatory Visit: Payer: Self-pay | Admitting: Family Medicine

## 2010-08-31 ENCOUNTER — Encounter: Payer: Self-pay | Admitting: Family Medicine

## 2010-08-31 ENCOUNTER — Ambulatory Visit (INDEPENDENT_AMBULATORY_CARE_PROVIDER_SITE_OTHER): Payer: Medicare Other | Admitting: Family Medicine

## 2010-08-31 VITALS — BP 159/99 | HR 91 | Temp 98.9°F | Wt 160.0 lb

## 2010-08-31 DIAGNOSIS — J329 Chronic sinusitis, unspecified: Secondary | ICD-10-CM

## 2010-08-31 MED ORDER — AZITHROMYCIN 250 MG PO TABS: ORAL_TABLET | ORAL | Status: AC

## 2010-08-31 NOTE — Patient Instructions (Signed)
Call if not better in 7 days

## 2010-08-31 NOTE — Progress Notes (Signed)
  Subjective:    Patient ID: Jimmy Mayo, male    DOB: 06/26/1950, 60 y.o.   MRN: [id number]  HPI 10 days ago started with sinus pressure and drinage as the wather changed . Has been using benadrul and that usally helps.  Constantly blowing out large amouts of mucous. No fever. Some cough as well.  Hx of recurrent sinsusitis and bronchitis.  No ST. Facial pressure is bilateral. Ears feel plugged up.  A little moe SOB. Using his albuterol bid and that is usual for him. Hasn't used it more frequently.     Review of Systems     Objective:   Physical Exam  Constitutional: He is oriented to person, place, and time. He appears well-developed and well-nourished.  HENT:  Head: Normocephalic and atraumatic.  Right Ear: External ear normal.  Left Ear: External ear normal.  Nose: Nose normal.  Mouth/Throat: Oropharynx is clear and moist.       TMs and canals are clear.   Eyes: Conjunctivae and EOM are normal. Pupils are equal, round, and reactive to light.  Neck: Neck supple. No thyromegaly present.  Cardiovascular: Normal rate and normal heart sounds.   Pulmonary/Chest: Effort normal.       prolonged expiration.    Lymphadenopathy:    He has no cervical adenopathy.  Neurological: He is alert and oriented to person, place, and time.  Skin: Skin is warm and dry.  Psychiatric: He has a normal mood and affect.          Assessment & Plan:  Sinusitis sxs x 10 days.  Will tx. Lungs at baseline. Call if feels his COPD is flaring. Rec smoking cessation. F/U if not better in one week.

## 2010-09-16 ENCOUNTER — Other Ambulatory Visit: Payer: Self-pay | Admitting: *Deleted

## 2010-09-16 MED ORDER — ESZOPICLONE 3 MG PO TABS: 3.0000 mg | ORAL_TABLET | Freq: Every day | ORAL | Status: DC

## 2010-09-16 MED ORDER — ALPRAZOLAM 0.5 MG PO TABS: 0.5000 mg | ORAL_TABLET | Freq: Three times a day (TID) | ORAL | Status: DC | PRN

## 2010-09-18 ENCOUNTER — Encounter: Payer: Self-pay | Admitting: Family Medicine

## 2010-09-28 ENCOUNTER — Encounter: Payer: Medicare Other | Admitting: Family Medicine

## 2010-09-29 ENCOUNTER — Encounter: Payer: Self-pay | Admitting: Family Medicine

## 2010-09-29 ENCOUNTER — Other Ambulatory Visit: Payer: Self-pay | Admitting: Family Medicine

## 2010-09-29 ENCOUNTER — Ambulatory Visit (INDEPENDENT_AMBULATORY_CARE_PROVIDER_SITE_OTHER): Payer: Medicare Other | Admitting: Family Medicine

## 2010-09-29 DIAGNOSIS — F419 Anxiety disorder, unspecified: Secondary | ICD-10-CM

## 2010-09-29 DIAGNOSIS — I10 Essential (primary) hypertension: Secondary | ICD-10-CM

## 2010-09-29 DIAGNOSIS — Z86010 Personal history of colonic polyps: Secondary | ICD-10-CM

## 2010-09-29 DIAGNOSIS — F411 Generalized anxiety disorder: Secondary | ICD-10-CM

## 2010-09-29 DIAGNOSIS — Z0000 Encounter for general adult medical examination without abnormal findings: Secondary | ICD-10-CM

## 2010-09-29 DIAGNOSIS — Z23 Encounter for immunization: Secondary | ICD-10-CM

## 2010-09-29 LAB — POCT URINALYSIS DIPSTICK
Bilirubin, UA: NEGATIVE
Glucose, UA: NEGATIVE
Ketones, UA: NEGATIVE
Leukocytes, UA: NEGATIVE
Nitrite, UA: NEGATIVE
pH, UA: 5.5

## 2010-09-29 LAB — COMPREHENSIVE METABOLIC PANEL
Albumin: 4.6 g/dL (ref 3.5–5.2)
Alkaline Phosphatase: 56 U/L (ref 39–117)
BUN: 15 mg/dL (ref 6–23)
Calcium: 9.9 mg/dL (ref 8.4–10.5)
Chloride: 102 mEq/L (ref 96–112)
Glucose, Bld: 104 mg/dL — ABNORMAL HIGH (ref 70–99)
Potassium: 4.4 mEq/L (ref 3.5–5.3)
Sodium: 141 mEq/L (ref 135–145)
Total Protein: 6.8 g/dL (ref 6.0–8.3)

## 2010-09-29 LAB — CBC WITH DIFFERENTIAL/PLATELET
HCT: 48.1 % (ref 39.0–52.0)
Hemoglobin: 16.2 g/dL (ref 13.0–17.0)
Lymphocytes Relative: 29 % (ref 12–46)
Lymphs Abs: 1.9 10*3/uL (ref 0.7–4.0)
Monocytes Absolute: 0.7 10*3/uL (ref 0.1–1.0)
Monocytes Relative: 10 % (ref 3–12)
Neutro Abs: 3.8 10*3/uL (ref 1.7–7.7)
Neutrophils Relative %: 58 % (ref 43–77)
RBC: 5.03 MIL/uL (ref 4.22–5.81)
WBC: 6.6 10*3/uL (ref 4.0–10.5)

## 2010-09-29 LAB — TSH: TSH: 2.153 u[IU]/mL (ref 0.350–4.500)

## 2010-09-29 LAB — LIPID PANEL
HDL: 57 mg/dL (ref >39)
LDL Cholesterol: 109 mg/dL — ABNORMAL HIGH (ref 0–99)
Triglycerides: 210 mg/dL — ABNORMAL HIGH (ref <150)

## 2010-09-29 MED ORDER — VALSARTAN-HYDROCHLOROTHIAZIDE 80-12.5 MG PO TABS: 1.0000 | ORAL_TABLET | Freq: Every day | ORAL | Status: DC

## 2010-09-29 MED ORDER — VALSARTAN 80 MG PO TABS: 80.0000 mg | ORAL_TABLET | Freq: Every day | ORAL | Status: DC

## 2010-09-29 NOTE — Progress Notes (Signed)
  Subjective:    Patient ID: Jimmy Mayo, male    DOB: [birth year], 60 y.o.   MRN: [id number]  HPI  60 year old WM w/HX of colon polyps,, asthma , reflux, and HBP. States it has been a stressful decade but this year even more stressful.     Review of Systems  Constitutional: Positive for fatigue. Negative for activity change and appetite change.  Respiratory: Positive for shortness of breath and wheezing.   Cardiovascular: Negative.   Gastrointestinal: Negative.   Genitourinary: Negative.   Musculoskeletal: Positive for myalgias, back pain, joint swelling and gait problem.  Neurological: Negative.   Psychiatric/Behavioral: The patient is nervous/anxious.        Objective:   Physical Exam  Constitutional: He is oriented to person, place, and time. He appears well-developed and well-nourished.  HENT:  Head: Normocephalic and atraumatic.  Right Ear: External ear normal.  Left Ear: External ear normal.  Nose: Nose normal.  Mouth/Throat: Oropharynx is clear and moist.  Eyes: Conjunctivae and EOM are normal. Pupils are equal, round, and reactive to light.  Neck: Normal range of motion. Neck supple.  Cardiovascular: Normal rate, regular rhythm, normal heart sounds and intact distal pulses.   Pulmonary/Chest: Effort normal and breath sounds normal.  Abdominal: Soft. Bowel sounds are normal.  Genitourinary: Rectum normal, prostate normal and penis normal. Guaiac negative stool. No penile tenderness.  Musculoskeletal: Normal range of motion.       Surgical scar over lower back  Neurological: He is alert and oriented to person, place, and time. He has normal reflexes.  Skin: Skin is warm.  Psychiatric: He has a normal mood and affect. His behavior is normal. Judgment and thought content normal.          Assessment & Plan:  HBP not under the best control change to Divan 80/12.  Return in 3 months Patient will schedule his colonoscopy with the new group.

## 2010-10-06 ENCOUNTER — Other Ambulatory Visit: Payer: Self-pay | Admitting: Family Medicine

## 2010-10-06 NOTE — Telephone Encounter (Signed)
Request was made for diovan 80mg  but the script was changed to diovan 80/12.5 mg on 09-29-10 and filled for 1 year.  Pharm notified of this change. Jarvis Newcomer, LPN [facility]

## 2010-10-13 ENCOUNTER — Encounter: Payer: Self-pay | Admitting: Family Medicine

## 2010-12-14 ENCOUNTER — Other Ambulatory Visit: Payer: Self-pay | Admitting: Family Medicine

## 2010-12-16 ENCOUNTER — Other Ambulatory Visit: Payer: Self-pay | Admitting: *Deleted

## 2010-12-16 MED ORDER — ALPRAZOLAM 0.5 MG PO TABS: 0.5000 mg | ORAL_TABLET | Freq: Three times a day (TID) | ORAL | Status: DC | PRN

## 2011-01-12 ENCOUNTER — Other Ambulatory Visit: Payer: Self-pay | Admitting: Family Medicine

## 2011-01-14 ENCOUNTER — Telehealth: Payer: Self-pay | Admitting: Family Medicine

## 2011-01-14 MED ORDER — ESZOPICLONE 3 MG PO TABS: 3.0000 mg | ORAL_TABLET | Freq: Every day | ORAL | Status: DC

## 2011-01-14 NOTE — Telephone Encounter (Signed)
Patient called advised that he had [facility] pharmacy send a refill request a couple days ago for Lunesta 3mg  and they havent heard anything from our office.. Pt request a refill today

## 2011-02-22 ENCOUNTER — Other Ambulatory Visit: Payer: Self-pay | Admitting: *Deleted

## 2011-02-22 MED ORDER — FINASTERIDE 5 MG PO TABS: 5.0000 mg | ORAL_TABLET | Freq: Every day | ORAL | Status: DC

## 2011-02-22 MED ORDER — OMEPRAZOLE 40 MG PO CPDR: 40.0000 mg | DELAYED_RELEASE_CAPSULE | Freq: Every day | ORAL | Status: DC

## 2011-03-11 ENCOUNTER — Other Ambulatory Visit: Payer: Self-pay | Admitting: Family Medicine

## 2011-03-13 ENCOUNTER — Telehealth: Payer: Self-pay | Admitting: Family Medicine

## 2011-03-15 ENCOUNTER — Other Ambulatory Visit: Payer: Self-pay | Admitting: *Deleted

## 2011-03-15 MED ORDER — ALBUTEROL SULFATE HFA 108 (90 BASE) MCG/ACT IN AERS: 2.0000 | INHALATION_SPRAY | Freq: Four times a day (QID) | RESPIRATORY_TRACT | Status: DC | PRN

## 2011-03-21 ENCOUNTER — Other Ambulatory Visit: Payer: Self-pay | Admitting: Family Medicine

## 2011-03-25 ENCOUNTER — Encounter: Payer: Self-pay | Admitting: Family Medicine

## 2011-03-25 ENCOUNTER — Ambulatory Visit (INDEPENDENT_AMBULATORY_CARE_PROVIDER_SITE_OTHER): Payer: Medicare Other | Admitting: Family Medicine

## 2011-03-25 VITALS — BP 149/95 | HR 97 | Ht 68.5 in | Wt 163.0 lb

## 2011-03-25 DIAGNOSIS — F419 Anxiety disorder, unspecified: Secondary | ICD-10-CM

## 2011-03-25 DIAGNOSIS — I10 Essential (primary) hypertension: Secondary | ICD-10-CM

## 2011-03-25 DIAGNOSIS — F411 Generalized anxiety disorder: Secondary | ICD-10-CM

## 2011-03-25 DIAGNOSIS — K2100 Gastro-esophageal reflux disease with esophagitis, without bleeding: Secondary | ICD-10-CM

## 2011-03-25 DIAGNOSIS — N400 Enlarged prostate without lower urinary tract symptoms: Secondary | ICD-10-CM

## 2011-03-25 DIAGNOSIS — J45909 Unspecified asthma, uncomplicated: Secondary | ICD-10-CM

## 2011-03-25 MED ORDER — ALBUTEROL SULFATE HFA 108 (90 BASE) MCG/ACT IN AERS: 2.0000 | INHALATION_SPRAY | Freq: Four times a day (QID) | RESPIRATORY_TRACT | Status: DC | PRN

## 2011-03-25 MED ORDER — OMEPRAZOLE 40 MG PO CPDR: 40.0000 mg | DELAYED_RELEASE_CAPSULE | Freq: Every day | ORAL | Status: DC

## 2011-03-25 MED ORDER — FINASTERIDE 5 MG PO TABS: 5.0000 mg | ORAL_TABLET | Freq: Every day | ORAL | Status: DC

## 2011-03-25 MED ORDER — VALSARTAN-HYDROCHLOROTHIAZIDE 160-12.5 MG PO TABS: 1.0000 | ORAL_TABLET | Freq: Every day | ORAL | Status: DC

## 2011-03-25 MED ORDER — ALPRAZOLAM 0.5 MG PO TABS: ORAL_TABLET | ORAL | Status: DC

## 2011-03-25 NOTE — Progress Notes (Signed)
  Subjective:    Patient ID: Jimmy Mayo, male    DOB: 05/30/1950, 61 y.o.   MRN: [id number]  HPI  Patient is here for several things he states my changing his pharmacy so we'll need to renew all his medication and give him a written hardcopy. #1 Proair for asthma #2 renew his Proscar #3 reflux found on endoscopy #4 improvement of hypertension as reported by him since we placed him on Diovan HCT instead of plain Diovan however wants a day has been elevated he does make that when he's checked his blood pressure the diastolic number often remains close to 90.  #5 needs refill for Xanax.  Review of Systems  All other systems reviewed and are negative.     BP 149/95  Pulse 97  Ht 5' 8.5" (1.74 m)  Wt 163 lb (73.936 kg)  BMI 24.42 kg/m2  SpO2 96%  Objective:   Physical Exam  Constitutional: He is oriented to person, place, and time. He appears well-developed and well-nourished.  HENT:  Head: Normocephalic.  Eyes: Pupils are equal, round, and reactive to light.  Neck: Neck supple.  Cardiovascular: Normal rate, regular rhythm and normal heart sounds.   Pulmonary/Chest: Effort normal and breath sounds normal.  Neurological: He is alert and oriented to person, place, and time.  Skin: Skin is warm and dry.          Assessment & Plan:  #1 he needs renewal of his ProAir inhaler for his asthma. #2 renal Proscar for his prostate enlargement #3 reflux Prilosec will be renewed since it was found that he had esophagitis from reflux.  #4 hypertension. Will make a slight adjustment to his Diovan increase it to 160/12.5 for control of his hypertension #5 anxiety renew Xanax  Return in about 5- 6 months for physical.

## 2011-03-25 NOTE — Patient Instructions (Signed)
Fish Oil, Omega-3 Fatty Acids capsules (Rx) What is this medicine? FISH OIL, OMEGA-3 FATTY ACIDS (Fish Oil, oh MAY ga 3 fatty AS ids) are essential fats. It is used to treat high triglyceride levels. This medicine may be used for other purposes; ask your health care provider or pharmacist if you have questions. What should I tell my health care provider before I take this medicine? They need to know if you have any of these conditions -bleeding problems -lung or breathing disease, like asthma -an unusual or allergic reaction to fish oil, omega-3 fatty acids, fish, other medicines, foods, dyes, or preservatives -pregnant or trying to get pregnant -breast-feeding How should I use this medicine? Take this medicine by mouth with a glass of water. Follow the directions on the prescription label. Take with food. Take your medicine at regular intervals. Do not take your medicine more often than directed. Talk to your pediatrician regarding the use of this medicine in children. Special care may be needed. Overdosage: If you think you have taken too much of this medicine contact a poison control center or emergency room at once. NOTE: This medicine is only for you. Do not share this medicine with others. What if I miss a dose? If you miss a dose, take it as soon as you can. If it is almost time for your next dose, take only that dose. Do not take double or extra doses. What may interact with this medicine? -aspirin and aspirin-like medicines -herbal products like danshen, dong quai, garlic pills, ginger, ginkgo biloba, horse chestnut, willow bark, and others -medicines that treat or prevent blood clots like enoxaparin, heparin, warfarin This list may not describe all possible interactions. Give your health care provider a list of all the medicines, herbs, non-prescription drugs, or dietary supplements you use. Also tell them if you smoke, drink alcohol, or use illegal drugs. Some items may interact with  your medicine. What should I watch for while using this medicine? Follow a good diet and exercise plan. Taking this medicine does not replace a healthy lifestyle. Some foods that have omega-3 fatty acids naturally are fatty fish like albacore tuna, halibut, herring, mackerel, lake trout, salmon, and sardines. If you are scheduled for any medical or dental procedure, tell your healthcare provider that you are taking this medicine. You may need to stop taking this medicine before the procedure. What side effects may I notice from receiving this medicine? Side effects that you should report to your doctor or health care professional as soon as possible: -allergic reactions like skin rash, itching or hives, swelling of the face, lips, or tongue -breathing problems -chest pain -fever, infection -unusual bleeding or bruising Side effects that usually do not require medical attention (report to your doctor or health care professional if they continue or are bothersome): -bad or fishy breath -belching -body odor -diarrhea -nausea -stomach gas, upset -weight gain This list may not describe all possible side effects. Call your doctor for medical advice about side effects. You may report side effects to FDA at 1-800-FDA-1088. Where should I keep my medicine? Keep out of the reach of children. Store at room temperature between 15 and 30 degrees C (59 and 86 degrees F). Do not freeze. Throw away any unused medicine after the expiration date. NOTE: This sheet is a summary. It may not cover all possible information. If you have questions about this medicine, talk to your doctor, pharmacist, or health care provider.  2012, [employer]. (03/16/2007 12:55:14 PM)

## 2011-04-12 ENCOUNTER — Other Ambulatory Visit: Payer: Self-pay | Admitting: *Deleted

## 2011-04-12 MED ORDER — ESZOPICLONE 3 MG PO TABS: 3.0000 mg | ORAL_TABLET | Freq: Every day | ORAL | Status: DC

## 2011-04-19 ENCOUNTER — Telehealth: Payer: Self-pay | Admitting: *Deleted

## 2011-04-19 MED ORDER — ESZOPICLONE 3 MG PO TABS: 3.0000 mg | ORAL_TABLET | Freq: Every day | ORAL | Status: DC

## 2011-04-19 NOTE — Telephone Encounter (Signed)
Pt is requesting a refill on Lunesta. Please advise if we can refill.

## 2011-07-14 ENCOUNTER — Ambulatory Visit (INDEPENDENT_AMBULATORY_CARE_PROVIDER_SITE_OTHER): Payer: Medicare Other | Admitting: Physician Assistant

## 2011-07-14 ENCOUNTER — Encounter: Payer: Self-pay | Admitting: Physician Assistant

## 2011-07-14 ENCOUNTER — Ambulatory Visit: Payer: Medicare Other | Admitting: Physician Assistant

## 2011-07-14 VITALS — BP 131/92 | HR 110 | Ht 68.5 in | Wt 163.0 lb

## 2011-07-14 DIAGNOSIS — M109 Gout, unspecified: Secondary | ICD-10-CM

## 2011-07-14 MED ORDER — INDOMETHACIN 25 MG PO CAPS: 25.0000 mg | ORAL_CAPSULE | Freq: Three times a day (TID) | ORAL | Status: AC

## 2011-07-14 NOTE — Patient Instructions (Addendum)
Avoid Alcohol and red meat. Will check uric acid today and call with results. Start indomethicin 25mg  three times a day for 10 days.   Gout Gout is an inflammatory condition (arthritis) caused by a buildup of uric acid crystals in the joints. Uric acid is a chemical that is normally present in the blood. Under some circumstances, uric acid can form into crystals in your joints. This causes joint redness, soreness, and swelling (inflammation). Repeat attacks are common. Over time, uric acid crystals can form into masses (tophi) near a joint, causing disfigurement. Gout is treatable and often preventable. CAUSES  The disease begins with elevated levels of uric acid in the blood. Uric acid is produced by your body when it breaks down a naturally found substance called purines. This also happens when you eat certain foods such as meats and fish. Causes of an elevated uric acid level include:  Being passed down from parent to child (heredity).   Diseases that cause increased uric acid production (obesity, psoriasis, some cancers).   Excessive alcohol use.   Diet, especially diets rich in meat and seafood.   Medicines, including certain cancer-fighting drugs (chemotherapy), diuretics, and aspirin.   Chronic kidney disease. The kidneys are no longer able to remove uric acid well.   Problems with metabolism.  Conditions strongly associated with gout include:  Obesity.   High blood pressure.   High cholesterol.   Diabetes.  Not everyone with elevated uric acid levels gets gout. It is not understood why some people get gout and others do not. Surgery, joint injury, and eating too much of certain foods are some of the factors that can lead to gout. SYMPTOMS   An attack of gout comes on quickly. It causes intense pain with redness, swelling, and warmth in a joint.   Fever can occur.   Often, only one joint is involved. Certain joints are more commonly involved:   Base of the big toe.    Knee.   Ankle.   Wrist.   Finger.  Without treatment, an attack usually goes away in a few days to weeks. Between attacks, you usually will not have symptoms, which is different from many other forms of arthritis. DIAGNOSIS  Your caregiver will suspect gout based on your symptoms and exam. Removal of fluid from the joint (arthrocentesis) is done to check for uric acid crystals. Your caregiver will give you a medicine that numbs the area (local anesthetic) and use a needle to remove joint fluid for exam. Gout is confirmed when uric acid crystals are seen in joint fluid, using a special microscope. Sometimes, blood, urine, and X-ray tests are also used. TREATMENT  There are 2 phases to gout treatment: treating the sudden onset (acute) attack and preventing attacks (prophylaxis). Treatment of an Acute Attack  Medicines are used. These include anti-inflammatory medicines or steroid medicines.   An injection of steroid medicine into the affected joint is sometimes necessary.   The painful joint is rested. Movement can worsen the arthritis.   You may use warm or cold treatments on painful joints, depending which works best for you.   Discuss the use of coffee, vitamin C, or cherries with your caregiver. These may be helpful treatment options.  Treatment to Prevent Attacks After the acute attack subsides, your caregiver may advise prophylactic medicine. These medicines either help your kidneys eliminate uric acid from your body or decrease your uric acid production. You may need to stay on these medicines for a very long time.  The early phase of treatment with prophylactic medicine can be associated with an increase in acute gout attacks. For this reason, during the first few months of treatment, your caregiver may also advise you to take medicines usually used for acute gout treatment. Be sure you understand your caregiver's directions. You should also discuss dietary treatment with your  caregiver. Certain foods such as meats and fish can increase uric acid levels. Other foods such as dairy can decrease levels. Your caregiver can give you a list of foods to avoid. HOME CARE INSTRUCTIONS   Do not take aspirin to relieve pain. This raises uric acid levels.   Only take over-the-counter or prescription medicines for pain, discomfort, or fever as directed by your caregiver.   Rest the joint as much as possible. When in bed, keep sheets and blankets off painful areas.   Keep the affected joint raised (elevated).   Use crutches if the painful joint is in your leg.   Drink enough water and fluids to keep your urine clear or pale yellow. This helps your body get rid of uric acid. Do not drink alcoholic beverages. They slow the passage of uric acid.   Follow your caregiver's dietary instructions. Pay careful attention to the amount of protein you eat. Your daily diet should emphasize fruits, vegetables, whole grains, and fat-free or low-fat milk products.   Maintain a healthy body weight.  SEEK MEDICAL CARE IF:   You have an oral temperature above 102 F (38.9 C).   You develop diarrhea, vomiting, or any side effects from medicines.   You do not feel better in 24 hours, or you are getting worse.  SEEK IMMEDIATE MEDICAL CARE IF:   Your joint becomes suddenly more tender and you have:   Chills.   An oral temperature above 102 F (38.9 C), not controlled by medicine.  MAKE SURE YOU:   Understand these instructions.   Will watch your condition.   Will get help right away if you are not doing well or get worse.  Document Released: 12/26/1999 Document Revised: 12/17/2010 Document Reviewed: 04/07/2009 [facility] Patient Information 2012 [place], [place].

## 2011-07-14 NOTE — Progress Notes (Signed)
  Subjective:    Patient ID: Jimmy Mayo, male    DOB: 08-28-1950, 61 y.o.   MRN: [id number]  HPI Patient has had pain in his left big toe for 6 days that is gradually getting worse. It was bearable at first but not has gotten to 9/10 pain that has the typical presentation of not even being able to let a sheet touch it. He denies any trauma. He has had to wear sandles for the last couple of days b/c he cannot wear tennis shoes. He has had gout one other time about 10 years ago. He has eaten a lot of red meat and drank a lot of alcohol lately. He has taken some aleve and there has been mild relief.   Review of Systems     Objective:   Physical Exam  Constitutional: He appears well-developed and well-nourished.  HENT:  Head: Normocephalic and atraumatic.  Cardiovascular: Normal rate, regular rhythm and normal heart sounds.   Pulmonary/Chest: Effort normal and breath sounds normal. He has no wheezes.  Skin:       Left great toe red and mildly swollen around the metatarsal joint. Slightly warmer to the touch than the right foot. Tenderness to all palpation around the Left great metatarsal joint.   Psychiatric: He has a normal mood and affect. His behavior is normal.          Assessment & Plan:  Gout- Will get uric acid level today. Gave handout on gout. Reminded pt of gout diet that reduces flares. Gave indomethacin treatment for acute flare. Follow up if not resolving.

## 2011-08-28 ENCOUNTER — Other Ambulatory Visit: Payer: Self-pay | Admitting: Family Medicine

## 2011-09-21 ENCOUNTER — Other Ambulatory Visit: Payer: Self-pay | Admitting: *Deleted

## 2011-09-21 DIAGNOSIS — F419 Anxiety disorder, unspecified: Secondary | ICD-10-CM

## 2011-09-21 MED ORDER — ALPRAZOLAM 0.5 MG PO TABS: ORAL_TABLET | ORAL | Status: DC

## 2011-10-21 ENCOUNTER — Encounter: Payer: Self-pay | Admitting: Family Medicine

## 2011-10-21 ENCOUNTER — Ambulatory Visit (INDEPENDENT_AMBULATORY_CARE_PROVIDER_SITE_OTHER): Payer: Medicare Other | Admitting: Family Medicine

## 2011-10-21 VITALS — BP 137/93 | HR 106 | Wt 164.0 lb

## 2011-10-21 DIAGNOSIS — H1045 Other chronic allergic conjunctivitis: Secondary | ICD-10-CM

## 2011-10-21 DIAGNOSIS — H1010 Acute atopic conjunctivitis, unspecified eye: Secondary | ICD-10-CM

## 2011-10-21 DIAGNOSIS — Z298 Encounter for other specified prophylactic measures: Secondary | ICD-10-CM

## 2011-10-21 DIAGNOSIS — M109 Gout, unspecified: Secondary | ICD-10-CM

## 2011-10-21 DIAGNOSIS — Z23 Encounter for immunization: Secondary | ICD-10-CM

## 2011-10-21 DIAGNOSIS — G479 Sleep disorder, unspecified: Secondary | ICD-10-CM

## 2011-10-21 MED ORDER — COLCHICINE 0.6 MG PO TABS: 0.6000 mg | ORAL_TABLET | Freq: Two times a day (BID) | ORAL | Status: DC

## 2011-10-21 MED ORDER — OLOPATADINE HCL 0.1 % OP SOLN: 1.0000 [drp] | Freq: Two times a day (BID) | OPHTHALMIC | Status: DC

## 2011-10-21 MED ORDER — GENTAMICIN SULFATE 0.3 % OP SOLN: 1.0000 [drp] | Freq: Four times a day (QID) | OPHTHALMIC | Status: DC

## 2011-10-21 MED ORDER — NAPROXEN-ESOMEPRAZOLE 500-20 MG PO TBEC: 1.0000 | DELAYED_RELEASE_TABLET | Freq: Two times a day (BID) | ORAL | Status: DC | PRN

## 2011-10-21 MED ORDER — ESZOPICLONE 3 MG PO TABS: 3.0000 mg | ORAL_TABLET | Freq: Every day | ORAL | Status: DC

## 2011-10-21 NOTE — Progress Notes (Signed)
Subjective:    Patient ID: Jimmy Mayo, male    DOB: 03/05/1950, 61 y.o.   MRN: [id number]  Eye Problem  There is pain in both eyes. This is a recurrent problem. The current episode started more than 1 month ago. The problem occurs constantly. The problem has been unchanged. There was no injury mechanism (chlorine, and construction fumes). He does not wear contacts. Associated symptoms include an eye discharge, eye redness and a foreign body sensation. Pertinent negatives include no blurred vision, double vision, fever, itching, nausea, photophobia, recent URI or vomiting. He has tried eye drops for the symptoms. The treatment provided no relief.   #2 gout. Patient was here in July because of a acute exacerbation of his gout. He was put on Indocin unfortunately did not tolerate Indocin and after a few days have to stop the Indocin for gout Better but his had multiple flareups since. Occasionally he'll bear he'll bear the GI upset disturbance 2 and try the Indocin use he has to stop that today to. He is type Epsom salts soaking the foot without much relief.  #3 patient needs immunization update. Flu shots needed  #4 sleep disturbance. He needs to have his Lunesta refilled which is working well for his sleep disturbance as well as some of his other medications.  Review of Systems  Constitutional: Negative for fever.  HENT:       Conjunctivitis  Eyes: Positive for discharge, redness, itching and visual disturbance. Negative for blurred vision, double vision, photophobia and pain.  Gastrointestinal: Negative for nausea and vomiting.  Skin: Negative for itching.      BP 137/93  Pulse 106  Wt 164 lb (74.39 kg) Objective:   Physical Exam  Constitutional: He is oriented to person, place, and time. He appears well-developed and well-nourished.  HENT:  Head: Normocephalic and atraumatic.  Eyes: Pupils are equal, round, and reactive to light. Right eye exhibits no discharge. Left eye exhibits no  discharge. Right conjunctiva is injected. Left conjunctiva is injected.  Neck: Neck supple.  Neurological: He is alert and oriented to person, place, and time.  Skin: Skin is warm.  Psychiatric: He has a normal mood and affect. Thought content normal.       Assessment & Plan:  #1 chronic irritation of both eyes. While likely this is dust and other fumes he is getting exposed to construction at his mothers house and is unlikely coming from foreign object in the eye since this going on for over a month but will try to get his eyes irritated just in case. I'm going to place him on an allergy eyedrops, antibiotic eyedrops, and antihistamine. If this persists over the next 1-2 weeks please contact me and when he came in to see an ophthalmologist.  #2 gout. When he comes back in for his yearly exam we draw uric acid to see whether we put him on ongoing medication for gout but in the meantime we plan to place on Colcrys brand name colchicine one tablet once to 3 times a day. While the medicine can cause diarrhea if he only takes it once or twice a day he should be fine. It turns out he has a strong reaction to it almost all anti-inflammatories Naprosyn Celebrex and now Indocin. He took anti-inflammatories for years and states that just getting around him upset stomach. We are going to try him on if I have samples of it Vivmo the anti-inflammatory Naprosyn combined with the PPI.  #3 flu shot  given  #4 renewal of Lunesta Return in one to 2 months for physical examination.

## 2011-10-21 NOTE — Patient Instructions (Signed)
Gout Gout is an inflammatory condition (arthritis) caused by a buildup of uric acid crystals in the joints. Uric acid is a chemical that is normally present in the blood. Under some circumstances, uric acid can form into crystals in your joints. This causes joint redness, soreness, and swelling (inflammation). Repeat attacks are common. Over time, uric acid crystals can form into masses (tophi) near a joint, causing disfigurement. Gout is treatable and often preventable. CAUSES  The disease begins with elevated levels of uric acid in the blood. Uric acid is produced by your body when it breaks down a naturally found substance called purines. This also happens when you eat certain foods such as meats and fish. Causes of an elevated uric acid level include:  Being passed down from parent to child (heredity).  Diseases that cause increased uric acid production (obesity, psoriasis, some cancers).  Excessive alcohol use.  Diet, especially diets rich in meat and seafood.  Medicines, including certain cancer-fighting drugs (chemotherapy), diuretics, and aspirin.  Chronic kidney disease. The kidneys are no longer able to remove uric acid well.  Problems with metabolism. Conditions strongly associated with gout include:  Obesity.  High blood pressure.  High cholesterol.  Diabetes. Not everyone with elevated uric acid levels gets gout. It is not understood why some people get gout and others do not. Surgery, joint injury, and eating too much of certain foods are some of the factors that can lead to gout. SYMPTOMS   An attack of gout comes on quickly. It causes intense pain with redness, swelling, and warmth in a joint.  Fever can occur.  Often, only one joint is involved. Certain joints are more commonly involved:  Base of the big toe.  Knee.  Ankle.  Wrist.  Finger. Without treatment, an attack usually goes away in a few days to weeks. Between attacks, you usually will not have  symptoms, which is different from many other forms of arthritis. DIAGNOSIS  Your caregiver will suspect gout based on your symptoms and exam. Removal of fluid from the joint (arthrocentesis) is done to check for uric acid crystals. Your caregiver will give you a medicine that numbs the area (local anesthetic) and use a needle to remove joint fluid for exam. Gout is confirmed when uric acid crystals are seen in joint fluid, using a special microscope. Sometimes, blood, urine, and X-ray tests are also used. TREATMENT  There are 2 phases to gout treatment: treating the sudden onset (acute) attack and preventing attacks (prophylaxis). Treatment of an Acute Attack  Medicines are used. These include anti-inflammatory medicines or steroid medicines.  An injection of steroid medicine into the affected joint is sometimes necessary.  The painful joint is rested. Movement can worsen the arthritis.  You may use warm or cold treatments on painful joints, depending which works best for you.  Discuss the use of coffee, vitamin C, or cherries with your caregiver. These may be helpful treatment options. Treatment to Prevent Attacks After the acute attack subsides, your caregiver may advise prophylactic medicine. These medicines either help your kidneys eliminate uric acid from your body or decrease your uric acid production. You may need to stay on these medicines for a very long time. The early phase of treatment with prophylactic medicine can be associated with an increase in acute gout attacks. For this reason, during the first few months of treatment, your caregiver may also advise you to take medicines usually used for acute gout treatment. Be sure you understand your caregiver's directions.   You should also discuss dietary treatment with your caregiver. Certain foods such as meats and fish can increase uric acid levels. Other foods such as dairy can decrease levels. Your caregiver can give you a list of foods  to avoid. HOME CARE INSTRUCTIONS   Do not take aspirin to relieve pain. This raises uric acid levels.  Only take over-the-counter or prescription medicines for pain, discomfort, or fever as directed by your caregiver.  Rest the joint as much as possible. When in bed, keep sheets and blankets off painful areas.  Keep the affected joint raised (elevated).  Use crutches if the painful joint is in your leg.  Drink enough water and fluids to keep your urine clear or pale yellow. This helps your body get rid of uric acid. Do not drink alcoholic beverages. They slow the passage of uric acid.  Follow your caregiver's dietary instructions. Pay careful attention to the amount of protein you eat. Your daily diet should emphasize fruits, vegetables, whole grains, and fat-free or low-fat milk products.  Maintain a healthy body weight. SEEK MEDICAL CARE IF:   You have an oral temperature above 102 F (38.9 C).  You develop diarrhea, vomiting, or any side effects from medicines.  You do not feel better in 24 hours, or you are getting worse. SEEK IMMEDIATE MEDICAL CARE IF:   Your joint becomes suddenly more tender and you have:  Chills.  An oral temperature above 102 F (38.9 C), not controlled by medicine. MAKE SURE YOU:   Understand these instructions.  Will watch your condition.  Will get help right away if you are not doing well or get worse. Document Released: 12/26/1999 Document Revised: 03/22/2011 Document Reviewed: 04/07/2009 [employer] Patient Information 2013 [employer].    

## 2011-11-10 ENCOUNTER — Ambulatory Visit (INDEPENDENT_AMBULATORY_CARE_PROVIDER_SITE_OTHER): Payer: Medicare Other | Admitting: Sports Medicine

## 2011-11-10 ENCOUNTER — Encounter: Payer: Self-pay | Admitting: Sports Medicine

## 2011-11-10 VITALS — BP 138/85 | HR 112 | Temp 98.2°F | Ht 69.0 in | Wt 160.0 lb

## 2011-11-10 DIAGNOSIS — H109 Unspecified conjunctivitis: Secondary | ICD-10-CM

## 2011-11-10 MED ORDER — DIPHENHYDRAMINE HCL 25 MG PO CAPS: 25.0000 mg | ORAL_CAPSULE | ORAL | Status: DC | PRN

## 2011-11-10 MED ORDER — PREDNISONE 50 MG PO TABS: ORAL_TABLET | ORAL | Status: DC

## 2011-11-10 MED ORDER — PREDNISOLONE ACETATE 0.12 % OP SUSP: 1.0000 [drp] | Freq: Four times a day (QID) | OPHTHALMIC | Status: DC

## 2011-11-10 NOTE — Assessment & Plan Note (Signed)
Timeframe is not classic for viral etiology, failure to improve with topical gentamicin drops, and subsequently ointment is not typical for bacterial etiology. There is no irritant trigger based on history. Symptoms thus likely represents persistent allergic conjunctivitis, he also has allergic rhinitis type symptoms. He notes no drowsiness with diphenhydramine, he will do 25 mg 3-4 times a day. Burst of prednisone for 5 days. Topical steroid eyedrops. Return to clinic one week.

## 2011-11-10 NOTE — Progress Notes (Signed)
Subjective:    CC: Bilateral itchy eyes  HPI: Jimmy Mayo comes back to see [place] approximately 3 weeks after his prior visit. He is overall, had about a month of redness, and itchiness of both eyes. Initially his left eye was affected without concurrent, or prior URI symptoms. He did have a runny nose. Subsequently, the right eye became infected. It has not affected his vision, but he describes the sensation as predominantly itchy, but without pain. He was seen, and started on Patanol, as well as gentamicin eyedrops. This did not improve his symptoms at all, he went to another physician who suspected acute sinusitis, and prescribed azithromycin, and changed him to gentamicin topical ointment. Overall, his symptoms are still persistent. He denies any use of grinding machines, or standing. Notes that construction on his house has been ongoing since long before his eye symptoms began. He denies any blunt trauma. Denies any sick contacts. He continues to have some rhinorrhea.  Past medical history, Surgical history, Family history, Social history, Allergies, and medications have been entered into the medical record, reviewed, and no changes needed.   Review of Systems: No fevers, chills, night sweats, weight loss, chest pain, or shortness of breath.   Objective:    General: Well Developed, well nourished, and in no acute distress.  Neuro: Alert and oriented x3, extra-ocular muscles intact.  HEENT: Normocephalic, atraumatic, pupils equal round reactive to light, neck supple, no masses, no lymphadenopathy, thyroid nonpalpable. Conjunctiva are injected bilaterally, there is no anterior chamber chemosis, or hyphema.  Vision is grossly normal. Globes are nontender to palpation. There is no drainage. He does have allergic shiners bilaterally. Skin: Warm and dry, no rashes. Cardiac: Regular rate and rhythm, no murmurs rubs or gallops.  Respiratory: Clear to auscultation bilaterally. Not using accessory muscles, speaking  in full sentences.  Impression and Recommendations:

## 2011-11-18 ENCOUNTER — Ambulatory Visit (INDEPENDENT_AMBULATORY_CARE_PROVIDER_SITE_OTHER): Payer: Medicare Other | Admitting: Sports Medicine

## 2011-11-18 ENCOUNTER — Encounter: Payer: Self-pay | Admitting: Sports Medicine

## 2011-11-18 VITALS — BP 125/73 | HR 107 | Temp 98.1°F | Wt 160.0 lb

## 2011-11-18 DIAGNOSIS — H109 Unspecified conjunctivitis: Secondary | ICD-10-CM

## 2011-11-18 NOTE — Addendum Note (Signed)
Addended by: Monica Becton on: 11/18/2011 05:42 PM   Modules accepted: Orders

## 2011-11-18 NOTE — Assessment & Plan Note (Addendum)
This point, he has failed multiple topical antibiotics, topical antihistamines, oral antihistamines, topical steroids, oral steroids, removal from the construction at his house, and symptoms are not improved. As he does have some mouth dryness, and joint and muscle pain, we'll go ahead and do a rheumatologic workup including Sjgren antibodies and HLA-B27 typing. I would also like him to see a local ophthalmologist for consideration of xerophthalmia and immunomodulator treatment.

## 2011-11-18 NOTE — Progress Notes (Signed)
Subjective:    CC: Bilateral itchy eyes  HPI: Jimmy Mayo comes back to see [place] approximately 1 week after his prior visit. He is overall, had about 2 months of redness, and itchiness of both eyes. Initially his left eye was affected without concurrent, or prior URI symptoms. He did have a runny nose. Subsequently, the right eye became affected. It has not affected his vision, but he describes the sensation as predominantly itchy, but without pain. He was seen, and started on Patanol, as well as gentamicin eyedrops. This did not improve his symptoms at all, he went to another physician who suspected acute sinusitis, and prescribed azithromycin, and changed him to gentamicin topical ointment. His symptoms remained persistent.  He denies any use of grinding machines, or sanding. Notes that construction on his house has been ongoing since long before his eye symptoms began, but even when he went on vacation for a week away from the construction his symptoms continued. He denies any blunt trauma. Denies any sick contacts. He continues to have some rhinorrhea.  I saw him about a week ago, placed him on oral prednisone, diphenhydramine, and gave him steroid eyedrops.  He returns with his symptoms slightly better but still persistent. Of note, he notes he drinks water continuously, and has continuous and frequent dry eyes. He also notes multiple joint complaints. He denies any history of sexually transmitted diseases penile discharge, dysuria, or infectious diarrhea syndromes. He has no family history of rheumatic disease.  Past medical history, Surgical history, Family history, Social history, Allergies, and medications have been entered into the medical record, reviewed, and no changes needed.   Review of Systems: No fevers, chills, night sweats, weight loss, chest pain, or shortness of breath.   Objective:    General: Well Developed, well nourished, and in no acute distress.  Neuro: Alert and oriented x3,  extra-ocular muscles intact.  HEENT: Normocephalic, atraumatic, pupils equal round reactive to light, neck supple, no masses, no lymphadenopathy, thyroid nonpalpable. Conjunctiva are injected bilaterally, there is no anterior chamber chemosis, or hyphema.  Vision is grossly normal. Globes are nontender to palpation. There is no drainage. He does have allergic shiners bilaterally. Skin: Warm and dry, no rashes. Cardiac: Regular rate and rhythm, no murmurs rubs or gallops.  Respiratory: Clear to auscultation bilaterally. Not using accessory muscles, speaking in full sentences.  Impression and Recommendations:

## 2011-11-19 LAB — COMPREHENSIVE METABOLIC PANEL WITH GFR
ALT: 28 U/L (ref 0–53)
Alkaline Phosphatase: 60 U/L (ref 39–117)
CO2: 31 meq/L (ref 19–32)
Potassium: 4.3 meq/L (ref 3.5–5.3)
Sodium: 136 meq/L (ref 135–145)
Total Bilirubin: 0.7 mg/dL (ref 0.3–1.2)
Total Protein: 7.2 g/dL (ref 6.0–8.3)

## 2011-11-19 LAB — RHEUMATOID FACTOR: Rheumatoid fact SerPl-aCnc: <10 [IU]/mL (ref <=14)

## 2011-11-19 LAB — CBC WITH DIFFERENTIAL/PLATELET
Basophils Absolute: 0 K/uL (ref 0.0–0.1)
Basophils Relative: 0 % (ref 0–1)
Eosinophils Absolute: 0.1 K/uL (ref 0.0–0.7)
Eosinophils Relative: 1 % (ref 0–5)
HCT: 47 % (ref 39.0–52.0)
Hemoglobin: 16.4 g/dL (ref 13.0–17.0)
Lymphocytes Relative: 24 % (ref 12–46)
Lymphs Abs: 2.3 10*3/uL (ref 0.7–4.0)
MCH: 32.2 pg (ref 26.0–34.0)
MCHC: 34.9 g/dL (ref 30.0–36.0)
MCV: 92.2 fL (ref 78.0–100.0)
Monocytes Absolute: 0.7 10*3/uL (ref 0.1–1.0)
Monocytes Relative: 8 % (ref 3–12)
Neutro Abs: 6.3 10*3/uL (ref 1.7–7.7)
Neutrophils Relative %: 67 % (ref 43–77)
Platelets: 268 K/uL (ref 150–400)
RBC: 5.1 MIL/uL (ref 4.22–5.81)
RDW: 13.5 % (ref 11.5–15.5)
WBC: 9.4 10*3/uL (ref 4.0–10.5)

## 2011-11-19 LAB — COMPREHENSIVE METABOLIC PANEL
AST: 22 U/L (ref 0–37)
Albumin: 4.5 g/dL (ref 3.5–5.2)
BUN: 15 mg/dL (ref 6–23)
Calcium: 10 mg/dL (ref 8.4–10.5)
Chloride: 97 mEq/L (ref 96–112)
Creat: 1.03 mg/dL (ref 0.50–1.35)
Glucose, Bld: 87 mg/dL (ref 70–99)

## 2011-11-19 LAB — CYCLIC CITRUL PEPTIDE ANTIBODY, IGG: Cyclic Citrullin Peptide Ab: <2 U/mL (ref 0.0–5.0)

## 2011-11-19 LAB — C-REACTIVE PROTEIN: CRP: <0.5 mg/dL (ref <0.60)

## 2011-11-19 LAB — SEDIMENTATION RATE: Sed Rate: 1 mm/h (ref 0–16)

## 2011-11-19 LAB — CK: Total CK: 42 U/L (ref 7–232)

## 2011-11-19 LAB — ANA: Anti Nuclear Antibody(ANA): NEGATIVE

## 2011-12-24 ENCOUNTER — Other Ambulatory Visit: Payer: Self-pay | Admitting: *Deleted

## 2011-12-24 DIAGNOSIS — F419 Anxiety disorder, unspecified: Secondary | ICD-10-CM

## 2011-12-24 MED ORDER — ALPRAZOLAM 0.5 MG PO TABS: ORAL_TABLET | ORAL | Status: DC

## 2011-12-29 ENCOUNTER — Ambulatory Visit (INDEPENDENT_AMBULATORY_CARE_PROVIDER_SITE_OTHER): Payer: Medicare Other | Admitting: Family Medicine

## 2011-12-29 ENCOUNTER — Encounter: Payer: Self-pay | Admitting: Family Medicine

## 2011-12-29 VITALS — BP 136/91 | HR 89 | Ht 68.5 in | Wt 164.0 lb

## 2011-12-29 DIAGNOSIS — Z125 Encounter for screening for malignant neoplasm of prostate: Secondary | ICD-10-CM

## 2011-12-29 DIAGNOSIS — F329 Major depressive disorder, single episode, unspecified: Secondary | ICD-10-CM | POA: Insufficient documentation

## 2011-12-29 DIAGNOSIS — Z23 Encounter for immunization: Secondary | ICD-10-CM

## 2011-12-29 DIAGNOSIS — Z0000 Encounter for general adult medical examination without abnormal findings: Secondary | ICD-10-CM

## 2011-12-29 DIAGNOSIS — Z8042 Family history of malignant neoplasm of prostate: Secondary | ICD-10-CM

## 2011-12-29 DIAGNOSIS — F32A Depression, unspecified: Secondary | ICD-10-CM

## 2011-12-29 DIAGNOSIS — Z8739 Personal history of other diseases of the musculoskeletal system and connective tissue: Secondary | ICD-10-CM | POA: Insufficient documentation

## 2011-12-29 DIAGNOSIS — B079 Viral wart, unspecified: Secondary | ICD-10-CM

## 2011-12-29 MED ORDER — ZOSTER VACCINE LIVE 19400 UNT/0.65ML ~~LOC~~ SOLR: 0.6500 mL | Freq: Once | SUBCUTANEOUS | Status: DC

## 2011-12-29 NOTE — Patient Instructions (Signed)
Advanced Directives: [email or web address]  Dr. Arthur Aydelotte's General Advice Following Your Complete Physical Exam  The Benefits of Regular Exercise: Unless you suffer from an uncontrolled cardiovascular condition, studies strongly suggest that regular exercise and physical activity will add to both the quality and length of your life.  The World Health Organization recommends 150 minutes of moderate intensity aerobic activity every week.  This is best split over 3-4 days a week, and can be as simple as a brisk walk for just over 35 minutes "most days of the week".  This type of exercise has been shown to lower LDL-Cholesterol, lower average blood sugars, lower blood pressure, lower cardiovascular disease risk, improve memory, and increase one's overall sense of wellbeing.  The addition of anaerobic (or "strength training") exercises offers additional benefits including but not limited to increased metabolism, prevention of osteoporosis, and improved overall cholesterol levels.  How Can I Strive For A Low-Fat Diet?: Current guidelines recommend that 25-35 percent of your daily energy (food) intake should come from fats.  One might ask how can this be achieved without having to dissect each meal on a daily basis?  Switch to skim or 1% milk instead of whole milk.  Focus on lean meats such as ground turkey, fresh fish, baked chicken, and lean cuts of beef as your source of dietary protein.  Consume less than 300mg/day of dietary cholesterol.  Limit trans fatty acid consumption primarily by limiting synthetic trans fats such as partially hydrogenated oils (Ex: fried fast foods).  Focus efforts on reducing your intake of "solid" fats (Ex: Butter).  Substitute olive or vegetable oil for solid fats where possible.  Moderation of Salt Intake: Provided you don't carry a diagnosis of congestive heart failure nor renal failure, I recommend a daily allowance of no more than  2300 mg of salt (sodium).  Keeping under this daily goal is associated with a decreased risk of cardiovascular events, creeping above it can lead to elevated blood pressures and increases your risk of cardiovascular events.  Milligrams (mg) of salt is listed on all nutrition labels, and your daily intake can add up faster than you think.  Most canned and frozen dinners can pack in over half your daily salt allowance in one meal.    Lifestyle Health Risks: Certain lifestyle choices carry specific health risks.  As you may already know, tobacco use has been associated with increasing one's risk of cardiovascular disease, pulmonary disease, numerous cancers, among many other issues.  What you may not know is that there are medications and nicotine replacement strategies that can more than double your chances of successfully quitting.  I would be thrilled to help manage your quitting strategy if you currently use tobacco products.  When it comes to alcohol use, I've yet to find an "ideal" daily allowance.  Provided an individual does not have a medical condition that is exacerbated by alcohol consumption, general guidelines determine "safe drinking" as no more than two standard drinks for a man or no more than one standard drink for a male per day.  However, much debate still exists on whether any amount of alcohol consumption is technically "safe".  My general advice, keep alcohol consumption to a minimum for general health promotion.  If you or others believe that alcohol, tobacco, or recreational drug use is interfering with your life, I would be happy to provide confidential counseling regarding treatment options.  General "Over The Counter" Nutrition Advice: Postmenopausal women should aim for a daily calcium intake of 1200   mg, however a significant portion of this might already be provided by diets including milk, yogurt, cheese, and other dairy products.  Vitamin D has been shown to help preserve bone  density, prevent fatigue, and has even been shown to help reduce falls in the elderly.  Ensuring a daily intake of 800 Units of Vitamin D is a good place to start to enjoy the above benefits, we can easily check your Vitamin D level to see if you'd potentially benefit from supplementation beyond 800 Units a day.  Folic Acid intake should be of particular concern to women of childbearing age.  Daily consumption of 400-800 mcg of Folic Acid is recommended to minimize the chance of spinal cord defects in a fetus should pregnancy occur.    For many adults, accidents still remain one of the most common culprits when it comes to cause of death.  Some of the simplest but most effective preventitive habits you can adopt include regular seatbelt use, proper helmet use, securing firearms, and regularly testing your smoke and carbon monoxide detectors.  Aila Terra B. Antwan Pandya DO [facility] [address], [address] [place], [place] [address] Phone: [phone]  

## 2011-12-29 NOTE — Progress Notes (Signed)
Subjective:    Jimmy Mayo is a 61 y.o. male who presents for Medicare Annual/Subsequent preventive examination.   Preventive Screening-Counseling & Management  Tobacco History  Smoking status  . Current Every Day Smoker -- 1.5 packs/day for 35 years  Smokeless tobacco  . Not on file   Colonoscopy: Will be having this first of the year Prostate: Discussed screening risks/beneifts with patient on 12/29/2011.  PSA 1.72 2012, 0.5 2011. Father Dx with Prostate Cancer at age 57s. We will continue to get annual PSAs  AAA Screen: He will need this age 37  Influenza Vaccine: 10/2011 Pneumovax: COPD and smoker he will receive today Td/Tdap: Td 2009 Zoster: He was given a prescription to have this priced and received at his pharmacy  Positive Depression Screen Positive Hearing Difficulties Positive Memory Difficulties  Problems Prior to Visit 1. See Below  Current Problems (verified) Patient Active Problem List  Diagnosis  . DYSLIPIDEMIA  . ANXIETY STATE, UNSPECIFIED  . NICOTINE ADDICTION  . DEPRESSION  . MECHANICAL PTOSIS  . COPD  . NEPHROLITHIASIS  . HEMATURIA UNSPECIFIED  . KNEE PAIN, RIGHT  . DISC DISEASE, LUMBAR  . INSOMNIA  . HYPERGLYCEMIA  . ESSENTIAL HYPERTENSION, BENIGN  . Conjunctivitis  . History of gout  . Wart Right Ear  . Depression  . Family history of prostate cancer    Medications Prior to Visit Current Outpatient Prescriptions on File Prior to Visit  Medication Sig Dispense Refill  . albuterol (PROAIR HFA) 108 (90 BASE) MCG/ACT inhaler Inhale 2 puffs into the lungs every 6 (six) hours as needed.  3 Inhaler  3  . ALPRAZolam (XANAX) 0.5 MG tablet 1/2 tablet twice a day and 1 whole at night  180 tablet  0  . colchicine (COLCRYS) 0.6 MG tablet Take 1 tablet (0.6 mg total) by mouth 2 (two) times daily. May take once a day as well as needed  60 tablet  6  . diphenhydrAMINE (BENADRYL) 25 mg capsule Take 1 capsule (25 mg total) by mouth every 4 (four)  hours as needed for itching.  30 capsule  2  . Eszopiclone 3 MG TABS Take 3 mg by mouth at bedtime. Take immediately before bedtime      . finasteride (PROSCAR) 5 MG tablet Take 1 tablet (5 mg total) by mouth daily.  90 tablet  3  . fish oil-omega-3 fatty acids 1000 MG capsule Take 2 g by mouth daily.        [name] guaiFENesin (MUCINEX) 600 MG 12 hr tablet Take 1,200 mg by mouth 2 (two) times daily.      . Multiple Vitamin (MULTIVITAMIN) capsule Take 1 capsule by mouth daily.        . Naproxen-Esomeprazole (VIMOVO) 500-20 MG TBEC Take 1 tablet by mouth 2 (two) times daily as needed.  60 tablet  6  . olopatadine (PATANOL) 0.1 % ophthalmic solution Place 1 drop into both eyes 2 (two) times daily.  5 mL  12  . omeprazole (PRILOSEC) 40 MG capsule Take 1 capsule (40 mg total) by mouth daily. Take 30 min before breakfast  90 capsule  3  . traMADol (ULTRAM) 50 MG tablet Take 50 mg by mouth. Take 1-2 tabs by mouth every 8 hours       . valsartan-hydrochlorothiazide (DIOVAN HCT) 160-12.5 MG per tablet Take 1 tablet by mouth daily.  90 tablet  3    Current Medications (verified) Current Outpatient Prescriptions  Medication Sig Dispense Refill  . albuterol (PROAIR  HFA) 108 (90 BASE) MCG/ACT inhaler Inhale 2 puffs into the lungs every 6 (six) hours as needed.  3 Inhaler  3  . ALPRAZolam (XANAX) 0.5 MG tablet 1/2 tablet twice a day and 1 whole at night  180 tablet  0  . colchicine (COLCRYS) 0.6 MG tablet Take 1 tablet (0.6 mg total) by mouth 2 (two) times daily. May take once a day as well as needed  60 tablet  6  . diphenhydrAMINE (BENADRYL) 25 mg capsule Take 1 capsule (25 mg total) by mouth every 4 (four) hours as needed for itching.  30 capsule  2  . Eszopiclone 3 MG TABS Take 3 mg by mouth at bedtime. Take immediately before bedtime      . finasteride (PROSCAR) 5 MG tablet Take 1 tablet (5 mg total) by mouth daily.  90 tablet  3  . fish oil-omega-3 fatty acids 1000 MG capsule Take 2 g by mouth daily.         [name] guaiFENesin (MUCINEX) 600 MG 12 hr tablet Take 1,200 mg by mouth 2 (two) times daily.      . Multiple Vitamin (MULTIVITAMIN) capsule Take 1 capsule by mouth daily.        . Naproxen-Esomeprazole (VIMOVO) 500-20 MG TBEC Take 1 tablet by mouth 2 (two) times daily as needed.  60 tablet  6  . olopatadine (PATANOL) 0.1 % ophthalmic solution Place 1 drop into both eyes 2 (two) times daily.  5 mL  12  . omeprazole (PRILOSEC) 40 MG capsule Take 1 capsule (40 mg total) by mouth daily. Take 30 min before breakfast  90 capsule  3  . traMADol (ULTRAM) 50 MG tablet Take 50 mg by mouth. Take 1-2 tabs by mouth every 8 hours       . valsartan-hydrochlorothiazide (DIOVAN HCT) 160-12.5 MG per tablet Take 1 tablet by mouth daily.  90 tablet  3  . zoster vaccine live, PF, (ZOSTAVAX) 21308 UNT/0.65ML injection Inject 19,400 Units into the skin once.  1 each  0     Allergies (verified) Amoxicillin; Penicillins; and Pravastatin sodium   PAST HISTORY  Family History Family History  Problem Relation Age of Onset  . Cancer Mother     colon  . Cancer Father     colon, prostate  . Diabetes Father   . Diabetes Maternal Grandmother     Social History History  Substance Use Topics  . Smoking status: Current Every Day Smoker -- 1.5 packs/day for 35 years  . Smokeless tobacco: Not on file  . Alcohol Use: Yes    Are there smokers in your home (other than you)?  No  Risk Factors Current exercise habits: Gym/ health club routine includes swimming and weights.  Dietary issues discussed: Benefits of a low-salt low-fat diet   Cardiac risk factors: advanced age (older than 45 for men, 23 for women), male gender and smoking/ tobacco exposure.  Depression Screen (Note: if answer to either of the following is "Yes", a more complete depression screening is indicated)   Q1: Over the past two weeks, have you felt down, depressed or hopeless? Yes  Q2: Over the past two weeks, have you felt little interest or  pleasure in doing things? Yes  Have you lost interest or pleasure in daily life? Yes  Do you often feel hopeless? Yes  Do you cry easily over simple problems? No  Activities of Daily Living In your present state of health, do you have any difficulty performing the  following activities?:  Driving? No Managing money?  No Feeding yourself? No Getting from bed to chair? No Climbing a flight of stairs? No Preparing food and eating?: No Bathing or showering? No Getting dressed: No Getting to the toilet? No Using the toilet:No Moving around from place to place: No In the past year have you fallen or had a near fall?:No   Are you sexually active?  No  Do you have more than one partner?  No  Hearing Difficulties: Yes Do you often ask people to speak up or repeat themselves? Yes Do you experience ringing or noises in your ears? No Do you have difficulty understanding soft or whispered voices? No   Do you feel that you have a problem with memory? Yes  Do you often misplace items? No  Do you feel safe at home?  Yes  Cognitive Testing  Alert? Yes  Normal Appearance?Yes  Oriented to person? Yes  Place? Yes   Time? Yes  Recall of three objects?  Yes  Can perform simple calculations? Yes  Displays appropriate judgment?Yes  Can read the correct time from a watch face?Yes   Advanced Directives have been discussed with the patient? Yes   List the Names of Other Physician/Practitioners you currently use: 1.  Dr. Trey Sailors  Dr. Nickel chiropractor Indicate any recent Medical Services you may have received from other than [facility] providers in the past year (date may be approximate).  Immunization History  Administered Date(s) Administered  . H1N1 12/26/2007  . Influenza Split 09/29/2010, 10/21/2011  . Influenza Whole 01/04/2007, 12/09/2008, 10/15/2009  . Pneumococcal Polysaccharide 12/29/2011  . Td 08/02/2007    Screening Tests Health Maintenance  Topic Date Due  . Colonoscopy   07/26/2001  . Zostavax  07/27/2011  . Influenza Vaccine  09/11/2012  . Tetanus/tdap  08/01/2017    All answers were reviewed with the patient and necessary referrals were made:  Laren Boom, DO   12/29/2011   History reviewed: allergies, current medications, past family history, past medical history, past social history, past surgical history and problem list  Review of Systems Review of Systems - General ROS: negative for - chills, fever, night sweats, weight gain or weight loss Ophthalmic ROS: negative for - decreased vision Psychological ROS: negative for - anxiety ENT ROS: negative for - hearing change, nasal congestion, tinnitus or allergies Hematological and Lymphatic ROS: negative for - bleeding problems, bruising or swollen lymph nodes Breast ROS: negative Respiratory ROS: no cough, shortness of breath, or wheezing Cardiovascular ROS: no chest pain or dyspnea on exertion Gastrointestinal ROS: no abdominal pain, change in bowel habits, or black or bloody stools Genito-Urinary ROS: negative for - genital discharge, genital ulcers, incontinence or abnormal bleeding from genitals Musculoskeletal ROS: negative for - joint pain or muscle pain Neurological ROS: negative for - headaches or memory loss Dermatological ROS: negative for lumps, mole changes, rash and skin lesion changes  Objective:     Vision by Snellen chart: right eye: Right 20/20, left 20/25, both 20/20  Blood pressure 136/91, pulse 89, height 5' 8.5" (1.74 m), weight 164 lb (74.39 kg). Body mass index is 24.57 kg/(m^2).  General: No Acute Distress HEENT: Atraumatic, normocephalic, conjunctivae normal without scleral icterus.  No nasal discharge, hearing grossly intact, TMs with good landmarks bilaterally with no middle ear abnormalities, posterior pharynx clear without oral lesions. Neck: Supple, trachea midline, no cervical nor supraclavicular adenopathy. Pulmonary: Clear to auscultation bilaterally without  wheezing, rhonchi, nor rales. Cardiac: Regular rate and rhythm.  No murmurs, rubs, nor gallops. No peripheral edema.  2+ peripheral pulses bilaterally. Abdomen: Bowel sounds normal.  No masses.  Non-tender without rebound.  Negative Murphy's sign. MSK: Grossly intact, no signs of weakness.  Full strength throughout upper and lower extremities.  Full ROM in upper and lower extremities.  No midline spinal tenderness. Neuro: Gait unremarkable, CN II-XII grossly intact.  C5-C6 Reflex 2/4 Bilaterally, L4 Reflex 2/4 Bilaterally.  Cerebellar function intact. Skin: There is a cauliflower-like lesion on the right superior aspect of the right ear, there is what looks like actinic keratosis on his right lower quadrant of the abdomen Psych: Alert and oriented to person/place/time.  Thought process normal. No anxiety/depression.     Assessment:     Uncontrolled depression, tobacco use, wart on the right ear, actinic keratosis abdomen, elevated risk prostate cancer     Plan:     During the course of the visit the patient was educated and counseled about appropriate screening and preventive services including:    Pneumococcal vaccine   Prostate cancer screening  Colorectal cancer screening  Smoking cessation counseling  Advanced directives: Provided patient with outside resources to help author directives.  Diet review for nutrition referral? Not indicated   Patient Instructions (the written plan) was given to the patient. Depression: Return within the next 4 weeks for discussion of medication Smoking cessation: We can consider Wellbutrin to help the above as well Subjective memory loss: Dementia screen negative, hopefully with depression treated this will improve Skin lesions: We'll do cryotherapy upcoming visit Needs updated labs at next visit  Medicare Attestation I have personally reviewed: The patient's medical and social history Their use of alcohol, tobacco or illicit drugs Their  current medications and supplements The patient's functional ability including ADLs,fall risks, home safety risks, cognitive, and hearing and visual impairment Diet and physical activities Evidence for depression or mood disorders  The patient's weight, height, BMI, and visual acuity have been recorded in the chart.  I have made referrals, counseling, and provided education to the patient based on review of the above and I have provided the patient with a written personalized care plan for preventive services.     Laren Boom, DO   12/29/2011

## 2011-12-30 LAB — PSA, MEDICARE: PSA: 0.63 ng/mL (ref <=4.00)

## 2012-01-26 ENCOUNTER — Ambulatory Visit: Payer: Medicare Other | Admitting: Family Medicine

## 2012-01-27 ENCOUNTER — Other Ambulatory Visit: Payer: Self-pay | Admitting: Family Medicine

## 2012-01-29 ENCOUNTER — Emergency Department (INDEPENDENT_AMBULATORY_CARE_PROVIDER_SITE_OTHER)
Admission: EM | Admit: 2012-01-29 | Discharge: 2012-01-29 | Disposition: A | Discharge status: Home / Self Care | Payer: Medicare Other | Source: Home / Self Care | Attending: Family Medicine | Admitting: Family Medicine

## 2012-01-29 ENCOUNTER — Encounter: Payer: Self-pay | Admitting: Emergency Medicine

## 2012-01-29 DIAGNOSIS — J069 Acute upper respiratory infection, unspecified: Secondary | ICD-10-CM

## 2012-01-29 DIAGNOSIS — J9801 Acute bronchospasm: Secondary | ICD-10-CM

## 2012-01-29 MED ORDER — PREDNISONE 20 MG PO TABS: 20.0000 mg | ORAL_TABLET | Freq: Two times a day (BID) | ORAL | Status: DC

## 2012-01-29 MED ORDER — AZITHROMYCIN 250 MG PO TABS: ORAL_TABLET | ORAL | Status: DC

## 2012-01-29 MED ORDER — BENZONATATE 200 MG PO CAPS: 200.0000 mg | ORAL_CAPSULE | Freq: Every day | ORAL | Status: DC

## 2012-01-29 NOTE — ED Provider Notes (Signed)
History     CSN: [id number]  Arrival date & time 01/29/12  1429   First MD Initiated Contact with Patient 01/29/12 1455      Chief Complaint  Patient presents with  . Nasal Congestion  . Cough     HPI Comments: Patient complains of onset of congestion and non-productive cough 3 days ago.  He had minimal sore throat and increased sinus congestion.  No fever.  He has had mild shortness of breath with activity, but has not had to use his albuterol inhaler.  He continues to smoke and has a history of frequent bronchitis.   The history is provided by the patient.    Past Medical History  Diagnosis Date  . GERD (gastroesophageal reflux disease)   . Anxiety   . Back pain   . Anemia   . Chronic kidney disease   . Kidney stone   . Asthma     Past Surgical History  Procedure Date  . Knee arthroscopy     1986  . Vasectomy   . Spine surgery 2006    l spine fusion/rods  . Nasal sinus surgery   . Spinal fusion   . Hydrocele excision / repair     x3 or 2 on 1 and 1 on the other  . Lithotripsy   . Colonoscopy w/ biopsies and polypectomy     was to have 1 this past year but planing to see new GI in [place] realize he may need onne every 5 years    Family History  Problem Relation Age of Onset  . Cancer Mother     colon  . Cancer Father     colon, prostate  . Diabetes Father   . Diabetes Maternal Grandmother     History  Substance Use Topics  . Smoking status: Current Every Day Smoker -- 1.5 packs/day for 35 years  . Smokeless tobacco: Not on file  . Alcohol Use: Yes      Review of Systems + sore throat + cough No pleuritic pain but has tightness in anterior chest + wheezing + nasal congestion + post-nasal drainage No sinus pain/pressure No itchy/red eyes No earache No hemoptysis + mild SOB No fever, + chills No nausea No vomiting + abdominal pain No diarrhea No urinary symptoms No skin rashes + fatigue No myalgias + headache Used OTC meds  without relief  Allergies  Amoxicillin; Penicillins; and Pravastatin sodium  Home Medications   Current Outpatient Rx  Name  Route  Sig  Dispense  Refill  . ALBUTEROL SULFATE HFA 108 (90 BASE) MCG/ACT IN AERS   Inhalation   Inhale 2 puffs into the lungs every 6 (six) hours as needed.   3 Inhaler   3   . ALPRAZOLAM 0.5 MG PO TABS      1/2 tablet twice a day and 1 whole at night   180 tablet   0   . AZITHROMYCIN 250 MG PO TABS      Take 2 tabs today; then begin one tab once daily for 4 more days.   6 each   0   . BENZONATATE 200 MG PO CAPS   Oral   Take 1 capsule (200 mg total) by mouth at bedtime. Take as needed for cough   12 capsule   0   . COLCHICINE 0.6 MG PO TABS   Oral   Take 1 tablet (0.6 mg total) by mouth 2 (two) times daily. May take once a day as  well as needed   60 tablet   6   . DIPHENHYDRAMINE HCL 25 MG PO CAPS   Oral   Take 1 capsule (25 mg total) by mouth every 4 (four) hours as needed for itching.   30 capsule   2   . LUNESTA 3 MG PO TABS      TAKE 1 TABLET AT BEDTIME.   30 tablet   0   . FINASTERIDE 5 MG PO TABS   Oral   Take 1 tablet (5 mg total) by mouth daily.   90 tablet   3   . OMEGA-3 FATTY ACIDS 1000 MG PO CAPS   Oral   Take 2 g by mouth daily.           . GUAIFENESIN ER 600 MG PO TB12   Oral   Take 1,200 mg by mouth 2 (two) times daily.         . MULTIVITAMINS PO CAPS   Oral   Take 1 capsule by mouth daily.           [name] NAPROXEN-ESOMEPRAZOLE 500-20 MG PO TBEC   Oral   Take 1 tablet by mouth 2 (two) times daily as needed.   60 tablet   6   . OLOPATADINE HCL 0.1 % OP SOLN   Both Eyes   Place 1 drop into both eyes 2 (two) times daily.   5 mL   12   . OMEPRAZOLE 40 MG PO CPDR   Oral   Take 1 capsule (40 mg total) by mouth daily. Take 30 min before breakfast   90 capsule   3   . PREDNISONE 20 MG PO TABS   Oral   Take 1 tablet (20 mg total) by mouth 2 (two) times daily.   10 tablet   0   . TRAMADOL HCL  50 MG PO TABS   Oral   Take 50 mg by mouth. Take 1-2 tabs by mouth every 8 hours          . VALSARTAN-HYDROCHLOROTHIAZIDE 160-12.5 MG PO TABS   Oral   Take 1 tablet by mouth daily.   90 tablet   3   . ZOSTER VACCINE LIVE 19400 UNT/0.65ML [place] SOLR   Subcutaneous   Inject 19,400 Units into the skin once.   1 each   0     Pulse 104  Temp 98.2 F (36.8 C) (Oral)  Resp 18  Ht 5\' 9"  (1.753 m)  Wt 165 lb (74.844 kg)  BMI 24.37 kg/m2  SpO2 100%  Physical Exam Nursing notes and Vital Signs reviewed. Appearance:  Patient appears healthy, stated age, and in no acute distress Eyes:  Pupils are equal, round, and reactive to light and accomodation.  Extraocular movement is intact.  Conjunctivae are not inflamed  Ears:  Canals normal.  Tympanic membranes normal.  Nose:  Mildly congested turbinates.  No sinus tenderness.   Pharynx:  Normal Neck:  Supple.  Slightly tender shotty posterior nodes are palpated bilaterally  Lungs:  Bilateral wheezes; no rales or rhonchi.  Breath sounds are equal.  No respiratory distress. Heart:  Regular rate and rhythm without murmurs, rubs, or gallops.  Abdomen:  Nontender without masses or hepatosplenomegaly.  Bowel sounds are present.  No CVA or flank tenderness.  Extremities:  No edema.  No calf tenderness Skin:  No rash present.   ED Course  Procedures none      1. Acute upper respiratory infections of unspecified site  2. Bronchospasm, acute       MDM  Begin Z-pack and prednisone burst.  Prescription written for Benzonatate (Tessalon) to take at bedtime for night-time cough.  Take plain Mucinex (guaifenesin) twice daily for cough and congestion.  Increase fluid intake, rest. May use Afrin nasal spray (or generic oxymetazoline) twice daily for about 5 days.  Also recommend using saline nasal spray several times daily and saline nasal irrigation (AYR is a common brand) Stop all antihistamines for now, and other non-prescription cough/cold  preparations. Continue albuterol inhaler. Follow-up with family doctor if not improving 7 to 10 days.         Lattie Haw, MD 02/03/12 1139

## 2012-01-29 NOTE — ED Notes (Signed)
Reports onset congestion and cough x 3 days ago; settled in chest rapidly and has hx of bronchitis and sinus infx with URIs. Did have Flu vaccination this season. No OTCs today.

## 2012-02-02 ENCOUNTER — Ambulatory Visit: Payer: Medicare Other | Admitting: Family Medicine

## 2012-02-09 ENCOUNTER — Ambulatory Visit: Payer: Medicare Other | Admitting: Family Medicine

## 2012-02-15 ENCOUNTER — Ambulatory Visit (INDEPENDENT_AMBULATORY_CARE_PROVIDER_SITE_OTHER): Payer: Medicare Other | Admitting: Family Medicine

## 2012-02-15 VITALS — BP 136/86 | HR 98 | Temp 97.8°F | Wt 163.0 lb

## 2012-02-15 DIAGNOSIS — F419 Anxiety disorder, unspecified: Secondary | ICD-10-CM | POA: Insufficient documentation

## 2012-02-15 DIAGNOSIS — E785 Hyperlipidemia, unspecified: Secondary | ICD-10-CM

## 2012-02-15 DIAGNOSIS — I10 Essential (primary) hypertension: Secondary | ICD-10-CM

## 2012-02-15 DIAGNOSIS — Z8739 Personal history of other diseases of the musculoskeletal system and connective tissue: Secondary | ICD-10-CM

## 2012-02-15 DIAGNOSIS — L821 Other seborrheic keratosis: Secondary | ICD-10-CM

## 2012-02-15 DIAGNOSIS — R7309 Other abnormal glucose: Secondary | ICD-10-CM

## 2012-02-15 DIAGNOSIS — F17200 Nicotine dependence, unspecified, uncomplicated: Secondary | ICD-10-CM

## 2012-02-15 DIAGNOSIS — F411 Generalized anxiety disorder: Secondary | ICD-10-CM

## 2012-02-15 DIAGNOSIS — L820 Inflamed seborrheic keratosis: Secondary | ICD-10-CM

## 2012-02-15 NOTE — Progress Notes (Signed)
CC: Jimmy Mayo is a 62 y.o. male is here for 6 month f/u and discuss labs   Subjective: HPI:  Patient presents for followup  History of gout: He reports to gout flares in his lifetime, most recently back in July. He istaking colchicine on a daily basis. He denies any joint pain at this time. He denies any redness, warmth, nor swelling of any of his joints.  He has tried to cut back on alcohol and high-protein meats and feels that he's been quite successful.  He is working out on a daily basis at [employer]. Uric acid during the time of his flare was 9, he has not had followup since.  Dyslipidemia: Years ago he was taking a cholesterol medication, he's unsure what was but it caused flushing and was not niacin. He has not had a lipid panel in over a year. He denies limb claudication, chest pain, shortness of breath, nor epigastric pain.  He carries a diagnosis of essential hypertension: He has been taking Diovan on a daily basis. He denies cramping, motor or sensory disturbances, chest pain, shortness of breath, irregular heartbeat, orthopnea, nor peripheral edema. No outside blood pressures to report  He is been a smoker for decades. He has tried to quit over 4 times, he is not sure how long with the longest period of cessation. Triggers include driving his car. Cravings improve and are almost absent while he is working out. He has tried the patches but had no success. He is currently using a vapor cigarette to help cut back on tobacco use. His packs per day is hard to quantify. He has the gums at home but has not tried them.  Anxiety: At his last visit he expressed subjective feelings of inadequately controlled anxiety. Since then his psychiatrist has prescribed him Xanax, he's using this one to 2 times a day and feels that it is helping tremendously. We talked about possibly starting Wellbutrin to help with smoking cessation at the last visit. He believes his anxiety is well controlled at this  time. Denies mental disturbance or depression.  He has a history of hyperglycemia which has not been followed up with an A1c. He denies polyphagia polydipsia nor polyuria.  He specifically asks why an A1c has not yet been drawn.  He is unsure about family history of type 2 diabetes. He denies poorly healing wounds nor tingling or numbness of extremities.  He has 2 skin lesions one on the right medial surface of the upper ear and another on the right abdomen. He is unsure how long they've been there. They often get red and irritated it any friction is placed on them. He denies any personal history of skin cancers. He has never had these evaluated before often by me.  Review Of Systems Outlined In HPI  Past Medical History  Diagnosis Date  . GERD (gastroesophageal reflux disease)   . Anxiety   . Back pain   . Anemia   . Chronic kidney disease   . Kidney stone   . Asthma      Family History  Problem Relation Age of Onset  . Cancer Mother     colon  . Cancer Father     colon, prostate  . Diabetes Father   . Diabetes Maternal Grandmother      History  Substance Use Topics  . Smoking status: Current Every Day Smoker -- 1.5 packs/day for 35 years  . Smokeless tobacco: Not on file  . Alcohol  Use: Yes     Objective: Filed Vitals:   02/15/12 1038  BP: 136/86  Pulse: 98  Temp: 97.8 F (36.6 C)    General: Alert and Oriented, No Acute Distress HEENT: Pupils equal, round, reactive to light. Conjunctivae clear.  Moist mucous membranes, pharynx without inflammation nor lesions.  Neck supple without palpable lymphadenopathy nor abnormal masses. Lungs: Clear to auscultation bilaterally, no wheezing/ronchi/rales.  Comfortable work of breathing. Good air movement. Cardiac: Regular rate and rhythm. Normal S1/S2.  No murmurs, rubs, nor gallops.   Abdomen: Soft nontender to palpation Extremities: No peripheral edema.  Strong peripheral pulses.  Mental Status: No depression, anxiety, nor  agitation. Skin: Warm and dry. There is a 5 mm seborrheic keratosis on the  upper medial aspect of the right ear.  There is a 1 cm circular seborrheic keratosis on the right abdomen. Both of these lesions appear irritated with mild erythema   Assessment & Plan: Jimmy Mayo was seen today for 6 month f/u and discuss labs.  Diagnoses and associated orders for this visit:  History of gout - Uric acid  Dyslipidemia - Lipid panel  Essential hypertension, benign - BASIC METABOLIC PANEL WITH GFR  Nicotine addiction  Anxiety  Hyperglycemia - HgB A1c  Seborrheic keratosis    History of gout: Due for uric acid to ensure cold of less than 6.0 Dyslipidemia: Clinically stable however overdue for a lipid panel Essential hypertension: Stable and controlled continue Diovan HCT Nicotine addiction: Uncontrolled, discussed behavioral, pharmaceutical, and nicotine gum approaches to help with smoking cessation. Discussed importance of a quit date. Patient will continue with vapor cigarettes and consider nicotine gum once he is a quit date to gout. Anxiety: Controlled and stable, understandably patient would like to avoid any more medications such as bupropion Hyperglycemia: Clinically controlled however will obtain an A1c to rule out type 2 diabetes Seborrheic keratosis: 2 lesions, patient requests cryotherapy given frequent irritation and pain  45 minutes spent face-to-face during visit today of which at least 50% was counseling or coordinating care regarding seborrheic keratosis, nicotine addiction, anxiety, hyperglycemia, essential hypertension, history of gout, dyslipidemia.   Return in about 3 months (around 05/14/2012).   Cryotherapy Procedure Note  Pre-operative Diagnosis: seborrheic keratosis inflamed x2  Post-operative Diagnosis: same  Locations: right ear superior medial aspect, right abdomen  Indications: recurrent pain and irritation  Anesthesia: none  Procedure Details  History  of allergy to iodine: no. Pacemaker? no.  Patient informed of risks (permanent scarring, infection, light or dark discoloration, bleeding, infection, weakness, numbness and recurrence of the lesion) and benefits of the procedure and verbal informed consent obtained.  The areas are treated with liquid nitrogen therapy, frozen until ice ball extended 2 mm beyond lesion, allowed to thaw, and treated again. The patient tolerated procedure well.  The patient was instructed on post-op care, warned that there may be blister formation, redness and pain. Recommend OTC analgesia as needed for pain.  Condition: Stable  Complications: none.  Plan: 1. Instructed to keep the area dry and covered for 24-48h and clean thereafter. 2. Warning signs of infection were reviewed.   3. Recommended that the patient use OTC analgesics as needed for pain.  4. Return in 2 weeks if not resolved will provide retreatment with NO CHARGE VISIT.

## 2012-02-16 ENCOUNTER — Telehealth: Payer: Self-pay | Admitting: Family Medicine

## 2012-02-16 DIAGNOSIS — E785 Hyperlipidemia, unspecified: Secondary | ICD-10-CM

## 2012-02-16 DIAGNOSIS — L821 Other seborrheic keratosis: Secondary | ICD-10-CM | POA: Insufficient documentation

## 2012-02-16 DIAGNOSIS — R7303 Prediabetes: Secondary | ICD-10-CM

## 2012-02-16 LAB — LIPID PANEL
Cholesterol: 226 mg/dL — ABNORMAL HIGH (ref 0–200)
HDL: 51 mg/dL (ref >39)
Total CHOL/HDL Ratio: 4.4 Ratio
Triglycerides: 677 mg/dL — ABNORMAL HIGH (ref <150)

## 2012-02-16 LAB — BASIC METABOLIC PANEL WITH GFR
CO2: 30 mEq/L (ref 19–32)
Calcium: 9.6 mg/dL (ref 8.4–10.5)
GFR, Est African American: >89 mL/min
GFR, Est Non African American: >89 mL/min
Sodium: 140 mEq/L (ref 135–145)

## 2012-02-16 LAB — HEMOGLOBIN A1C
Hgb A1c MFr Bld: 5.9 % — ABNORMAL HIGH (ref <5.7)
Mean Plasma Glucose: 123 mg/dL — ABNORMAL HIGH (ref <117)

## 2012-02-16 LAB — URIC ACID: Uric Acid, Serum: 9.6 mg/dL — ABNORMAL HIGH (ref 4.0–7.8)

## 2012-02-16 MED ORDER — FENOFIBRATE 48 MG PO TABS: ORAL_TABLET | ORAL | Status: DC

## 2012-02-16 NOTE — Telephone Encounter (Signed)
Left message to return call 

## 2012-02-16 NOTE — Telephone Encounter (Signed)
Sue Lush, Will you please let Mr. Avitabile know that his cholesterol levels are well above the goal to prevent liver, pancreatic, and cardiac complications.  The biggest abnormality had to do with triglycerides.  I'd encourage him to start with a low dose of Tricor, I didn't see that it had been tried yet in his medical record.  I've sent a low dose to [facility] that can be increased every 4 weeks.  His fasting blood sugar was perfect, but average 3 month sugar shows spikes around meal times, no need to start a new medication for that but he should try to curb simple sugar intake.  Uric acid was still above 6.0 showing moderate risk of gout in the future, I can either call in a new medication for that or he can review some detailed info on how to prevent gout with diet and lifestyle changes (in your box) first.  Regardless, I'd like him to f/u in 3 months, sooner if needed.

## 2012-02-16 NOTE — Telephone Encounter (Signed)
Patient advised.

## 2012-02-29 ENCOUNTER — Telehealth: Payer: Self-pay | Admitting: Family Medicine

## 2012-02-29 MED ORDER — ESZOPICLONE 3 MG PO TABS: 3.0000 mg | ORAL_TABLET | Freq: Every day | ORAL | Status: DC

## 2012-02-29 NOTE — Telephone Encounter (Signed)
Yes, ok to fill, placed in andrea's inbox.

## 2012-02-29 NOTE — Telephone Encounter (Signed)
faxed

## 2012-03-24 ENCOUNTER — Other Ambulatory Visit: Payer: Self-pay | Admitting: Physician Assistant

## 2012-03-24 NOTE — Telephone Encounter (Signed)
Jimmy Mayo, Rx placed in your inbox.

## 2012-03-24 NOTE — Telephone Encounter (Signed)
rx faxed

## 2012-03-30 ENCOUNTER — Telehealth: Payer: Self-pay | Admitting: *Deleted

## 2012-03-30 MED ORDER — ESZOPICLONE 3 MG PO TABS: 3.0000 mg | ORAL_TABLET | Freq: Every day | ORAL | Status: DC

## 2012-03-30 NOTE — Telephone Encounter (Signed)
Med refilled.

## 2012-04-19 ENCOUNTER — Other Ambulatory Visit: Payer: Self-pay | Admitting: Family Medicine

## 2012-05-01 ENCOUNTER — Encounter: Payer: Self-pay | Admitting: Family Medicine

## 2012-05-04 ENCOUNTER — Other Ambulatory Visit: Payer: Self-pay | Admitting: *Deleted

## 2012-05-04 MED ORDER — ESZOPICLONE 3 MG PO TABS: 3.0000 mg | ORAL_TABLET | Freq: Every day | ORAL | Status: DC

## 2012-06-01 ENCOUNTER — Encounter: Payer: Self-pay | Admitting: Family Medicine

## 2012-06-01 DIAGNOSIS — Z860100 Personal history of colon polyps, unspecified: Secondary | ICD-10-CM | POA: Insufficient documentation

## 2012-06-01 DIAGNOSIS — R1013 Epigastric pain: Secondary | ICD-10-CM | POA: Insufficient documentation

## 2012-06-26 ENCOUNTER — Other Ambulatory Visit: Payer: Self-pay | Admitting: *Deleted

## 2012-06-26 MED ORDER — ALPRAZOLAM 0.5 MG PO TABS: ORAL_TABLET | ORAL | Status: DC

## 2012-07-06 ENCOUNTER — Other Ambulatory Visit: Payer: Self-pay | Admitting: *Deleted

## 2012-07-06 DIAGNOSIS — J45909 Unspecified asthma, uncomplicated: Secondary | ICD-10-CM

## 2012-07-06 MED ORDER — PROAIR HFA 108 (90 BASE) MCG/ACT IN AERS: 2.0000 | INHALATION_SPRAY | Freq: Four times a day (QID) | RESPIRATORY_TRACT | Status: DC | PRN

## 2012-07-18 ENCOUNTER — Encounter: Payer: Self-pay | Admitting: Family Medicine

## 2012-07-18 ENCOUNTER — Ambulatory Visit (INDEPENDENT_AMBULATORY_CARE_PROVIDER_SITE_OTHER): Payer: Medicare Other | Admitting: Family Medicine

## 2012-07-18 VITALS — BP 135/86 | HR 104 | Wt 159.0 lb

## 2012-07-18 DIAGNOSIS — K2100 Gastro-esophageal reflux disease with esophagitis, without bleeding: Secondary | ICD-10-CM

## 2012-07-18 DIAGNOSIS — E785 Hyperlipidemia, unspecified: Secondary | ICD-10-CM

## 2012-07-18 DIAGNOSIS — Z8739 Personal history of other diseases of the musculoskeletal system and connective tissue: Secondary | ICD-10-CM

## 2012-07-18 DIAGNOSIS — Z862 Personal history of diseases of the blood and blood-forming organs and certain disorders involving the immune mechanism: Secondary | ICD-10-CM

## 2012-07-18 DIAGNOSIS — Z8639 Personal history of other endocrine, nutritional and metabolic disease: Secondary | ICD-10-CM

## 2012-07-18 DIAGNOSIS — N400 Enlarged prostate without lower urinary tract symptoms: Secondary | ICD-10-CM

## 2012-07-18 DIAGNOSIS — I10 Essential (primary) hypertension: Secondary | ICD-10-CM

## 2012-07-18 MED ORDER — FINASTERIDE 5 MG PO TABS: 5.0000 mg | ORAL_TABLET | Freq: Every day | ORAL | Status: DC

## 2012-07-18 MED ORDER — OMEPRAZOLE 40 MG PO CPDR: 40.0000 mg | DELAYED_RELEASE_CAPSULE | Freq: Every day | ORAL | Status: DC

## 2012-07-18 NOTE — Progress Notes (Signed)
CC: Jimmy Mayo is a 62 y.o. male is here for f/u SK's and Medication Management   Subjective: HPI:  Followup gastritis: Chronic abdominal pain workup with gastroenterology overall unremarkable except for gastritis thought to be secondary to alcohol intake. Patient has been using omeprazole 40 mg once a day still has occasional mild epigastric pain nothing makes better or worse. Pain is nonradiating. Has not changed in character frequency or severity over the past 3 months. He does feel that Nexium provided him with better response. Denies unintentional weight loss, back pain, constipation, diarrhea, pelvic pain, nausea, vomiting.  Followup hyperlipidemia: At his last visit triglycerides were significantly elevated, he took 3 days of TriCor but had unpredictable diarrhea soon after starting this has stopped since. He tries to watch what he eats tries to stay active. Denies right upper quadrant pain, skin or scleral discoloration.  History of gout: States that he has not been taking colchicine for over 3 months now due to intolerance to abdominal bloating. Symptoms no longer present since stopping. He is taking cherry extract. He denies gout symptoms such as swelling redness joint pain for over a year now. Last occurrence happened while eating shellfish on a daily basis increased alcohol intake.   Followup BPH: He is requesting refills on finasteride he is taking 5 mg a day and denies any lower urinary tract symptoms, waking no more than once at night to urinate, no sensation of incomplete voiding  Followup essential hypertension: Patient confirms taking valsartan and hydrochlorothiazide on a daily basis without missed doses. One blood pressure at GI visit systolic 160 all others he reports are consistently below 140/90. Denies chest pain, shortness of breath, orthopnea, peripheral edema, motor or sensory disturbances  Review Of Systems Outlined In HPI  Past Medical History  Diagnosis Date  .  GERD (gastroesophageal reflux disease)   . Anxiety   . Back pain   . Anemia   . Chronic kidney disease   . Kidney stone   . Asthma      Family History  Problem Relation Age of Onset  . Cancer Mother     colon  . Cancer Father     colon, prostate  . Diabetes Father   . Diabetes Maternal Grandmother      History  Substance Use Topics  . Smoking status: Current Every Day Smoker -- 1.50 packs/day for 35 years  . Smokeless tobacco: Not on file  . Alcohol Use: Yes     Objective: Filed Vitals:   07/18/12 1730  BP: 135/86  Pulse:     General: Alert and Oriented, No Acute Distress HEENT: Pupils equal, round, reactive to light. Conjunctivae clear.  Moist mucous membranes, pharynx unremarkable Lungs: Clear to auscultation bilaterally, no wheezing/ronchi/rales.  Comfortable work of breathing. Good air movement. Cardiac: Regular rate and rhythm. Normal S1/S2.  No murmurs, rubs, nor gallops.   Abdomen: soft no palpable masses mild epigastric pain with deep palpation Extremities: No peripheral edema.  Strong peripheral pulses.  Mental Status: No depression, anxiety, nor agitation. Skin: Warm and dry.  Assessment & Plan: Jimmy Mayo was seen today for f/u sk's and medication management.  Diagnoses and associated orders for this visit:  Essential hypertension, benign  Hyperlipidemia  History of gout  Reflux esophagitis - omeprazole (PRILOSEC) 40 MG capsule; Take 1 capsule (40 mg total) by mouth daily. Take 30 min before breakfast  Prostatic hypertrophy - finasteride (PROSCAR) 5 MG tablet; Take 1 tablet (5 mg total) by mouth daily.  Essential hypertension: Chronic controlled condition, repeat blood pressure manual in prehypertensive state, continue current valsartan and hydrochlorothiazide regimen with focus on salt minimization Hyperlipidemia: Uncontrolled I would like him to try fenofibrate one more time any diarrhea occurs stop immediately, call if this occurs and will switch  to a statin since he has not tolerated niacin History of gout: Controlled, given the infrequency continue focus on diet interventions including alcohol and shellfish avoidance Reflux esophagitis: He was given samples of Nexium for 2-1/2 weeks, if this improves lingering symptoms he will call me for a formal prescription if no improvement continue omeprazole Prostatic hypertrophy: Controlled, continue finasteride, he'll be due for PSA in 6 months   Return in about 3 months (around 10/18/2012).

## 2012-07-19 ENCOUNTER — Other Ambulatory Visit: Payer: Self-pay | Admitting: Family Medicine

## 2012-08-03 ENCOUNTER — Other Ambulatory Visit: Payer: Self-pay | Admitting: Family Medicine

## 2012-08-30 ENCOUNTER — Telehealth: Payer: Self-pay | Admitting: Family Medicine

## 2012-08-30 MED ORDER — ESZOPICLONE 3 MG PO TABS: 3.0000 mg | ORAL_TABLET | Freq: Every day | ORAL | Status: DC

## 2012-08-30 NOTE — Telephone Encounter (Signed)
Pt request

## 2012-08-31 ENCOUNTER — Other Ambulatory Visit: Payer: Self-pay | Admitting: Family Medicine

## 2012-09-25 ENCOUNTER — Telehealth: Payer: Self-pay | Admitting: Family Medicine

## 2012-09-25 MED ORDER — ALPRAZOLAM 0.5 MG PO TABS: ORAL_TABLET | ORAL | Status: DC

## 2012-09-25 NOTE — Telephone Encounter (Signed)
Pt request

## 2012-09-27 ENCOUNTER — Ambulatory Visit (INDEPENDENT_AMBULATORY_CARE_PROVIDER_SITE_OTHER): Payer: Medicare Other | Admitting: Family Medicine

## 2012-09-27 ENCOUNTER — Encounter: Payer: Self-pay | Admitting: Family Medicine

## 2012-09-27 VITALS — BP 139/85 | HR 101 | Wt 160.0 lb

## 2012-09-27 DIAGNOSIS — I10 Essential (primary) hypertension: Secondary | ICD-10-CM

## 2012-09-27 DIAGNOSIS — L819 Disorder of pigmentation, unspecified: Secondary | ICD-10-CM

## 2012-09-27 DIAGNOSIS — H04123 Dry eye syndrome of bilateral lacrimal glands: Secondary | ICD-10-CM

## 2012-09-27 DIAGNOSIS — G4700 Insomnia, unspecified: Secondary | ICD-10-CM

## 2012-09-27 DIAGNOSIS — J449 Chronic obstructive pulmonary disease, unspecified: Secondary | ICD-10-CM

## 2012-09-27 DIAGNOSIS — E785 Hyperlipidemia, unspecified: Secondary | ICD-10-CM

## 2012-09-27 DIAGNOSIS — H04129 Dry eye syndrome of unspecified lacrimal gland: Secondary | ICD-10-CM

## 2012-09-27 DIAGNOSIS — R1013 Epigastric pain: Secondary | ICD-10-CM

## 2012-09-27 DIAGNOSIS — Z23 Encounter for immunization: Secondary | ICD-10-CM

## 2012-09-27 MED ORDER — ALBUTEROL SULFATE HFA 108 (90 BASE) MCG/ACT IN AERS: INHALATION_SPRAY | RESPIRATORY_TRACT | Status: DC

## 2012-09-27 MED ORDER — ESOMEPRAZOLE MAGNESIUM 40 MG PO CPDR: 40.0000 mg | DELAYED_RELEASE_CAPSULE | Freq: Every day | ORAL | Status: DC

## 2012-09-27 MED ORDER — VALSARTAN-HYDROCHLOROTHIAZIDE 160-12.5 MG PO TABS: ORAL_TABLET | ORAL | Status: DC

## 2012-09-27 NOTE — Progress Notes (Signed)
CC: Jimmy Mayo is a 62 y.o. male is here for check spot on the leg and discuss medication   Subjective: HPI:  Followup abdominal pain: Working diagnosis is gastritis due to alcohol, he has cut back on alcohol and noticed a incredible improvement with epigastric pain switching from Prilosec to Nexium. He is taking to 20 mg 24-hour Nexium over-the-counter would like to know if prescription coverage would be cheaper denies epigastric pain or any abdominal pain whatsoever. Denies chest pain or sore throat.  Followup essential hypertension: Trying to stay active and reduce salt in his diet. Blood pressures at home consistently below 140/90. Denies chest pain, shortness of breath other than the low, orthopnea, peripheral edema, irregular heartbeat  Followup hyperlipidemia: He is taking TriCor 45 mg daily without myalgias or right quadrant pain. He is overdue for repeat triglyceride  COPD: Requesting refills on albuterol. States that he is using this one-2 times a week for shortness of breath. He denies cough, wheezing, chest pain, nor orthopnea. Symptoms seem to only occur when he is around smoke  He continues to have dry eyes bilaterally have not improved since starting Benadryl he occasionally has a dry mouth. He had blood work scheduled to rule out sojourn syndrome however he skipped his lab visit and did not follow up with ophthalmology. Symptoms have not gotten better or worse since onset denies discharge denies vision loss.  Followup insomnia: States he's taking Lunesta nightly basis doing extremely well rested in the morning he has difficulty getting to sleep and staying asleep if he does not take Lunesta. In the past Ambien would only allow him to sleep for 3-4 hours, it often caused him to wake up and parts of the house that he did not remember traveling to we'll sleeping. Benadryl and alprazolam have been no help with sleep  Patient has a brown spot on his left shin that he noticed 3 weeks  ago he's not sure if it's growing it is nonpainful does not irritate him he has no personal history of skin cancer no family history of melanoma. Denies unintentional weight loss bone or joint pain or swollen lymph nodes   Review Of Systems Outlined In HPI  Past Medical History  Diagnosis Date  . GERD (gastroesophageal reflux disease)   . Anxiety   . Back pain   . Anemia   . Chronic kidney disease   . Kidney stone   . Asthma      Family History  Problem Relation Age of Onset  . Cancer Mother     colon  . Cancer Father     colon, prostate  . Diabetes Father   . Diabetes Maternal Grandmother      History  Substance Use Topics  . Smoking status: Current Every Day Smoker -- 1.50 packs/day for 35 years  . Smokeless tobacco: Not on file  . Alcohol Use: Yes     Objective: Filed Vitals:   09/27/12 1110  BP: 139/85  Pulse: 101    General: Alert and Oriented, No Acute Distress HEENT: Pupils equal, round, reactive to light. Conjunctivae clear.  Moist mucous membranes pharynx unremarkable Lungs: Clear to auscultation bilaterally, no wheezing/ronchi/rales.  Comfortable work of breathing. Good air movement. Cardiac: Regular rate and rhythm. Normal S1/S2.  No murmurs, rubs, nor gallops.   Abdomen: Normal bowel sounds, soft and non tender without palpable masses. Extremities: No peripheral edema.  Strong peripheral pulses.  Mental Status: No depression, anxiety, nor agitation. Skin: Warm and dry. 7  mm x 5 mm waxy slightly raised pigmented lesion on the left calf anterior lateral with central hyperkeratosis coffee-colored  Assessment & Plan: [name] was seen today for check spot on the leg and discuss medication.  Diagnoses and associated orders for this visit:  Abdominal pain, epigastric - esomeprazole (NEXIUM) 40 MG capsule; Take 1 capsule (40 mg total) by mouth daily before breakfast.  Essential hypertension, benign - valsartan-hydrochlorothiazide (DIOVAN-HCT) 160-12.5 MG per  tablet; TAKE 1 TABLET ONCE DAILY  Hyperlipidemia  COPD - albuterol (PROAIR HFA) 108 (90 BASE) MCG/ACT inhaler; INHALE 2 PUFFS EVERY 6 HOURS AS NEEDED  Pigmented skin lesion  Dry eye, bilateral  Insomnia    Epigastric pain: Improved GERD symptoms continue Nexium prescription for 40 mg formulation provided Essential hypertension: Controlled continue valsartan -- hydrochlorothiazide, we'll change hydrochlorothiazide component if any gout flare occurs Hyperlipidemia: Uncontrolled patient politely declines repeat triglycerides today for we'll continue TriCor he prefer blood work in 3 months COPD: Controlled continue albuterol Pigmented skin lesion: High suspicion for seborrheic keratosis Will  reevaluate in 3 months Bilateral dry eyes: Patient declines labs today would prefer in 3 months with all other routine labs Insomnia: Controlled continue Lunesta, it looks like this will no longer be on formulary in 2015 anticipate prior authorization  Return in about 3 months (around 12/27/2012) for cpe.

## 2012-10-04 ENCOUNTER — Telehealth: Payer: Self-pay | Admitting: Family Medicine

## 2012-10-04 DIAGNOSIS — J449 Chronic obstructive pulmonary disease, unspecified: Secondary | ICD-10-CM

## 2012-10-04 MED ORDER — ALBUTEROL SULFATE HFA 108 (90 BASE) MCG/ACT IN AERS: INHALATION_SPRAY | RESPIRATORY_TRACT | Status: DC

## 2012-10-04 NOTE — Telephone Encounter (Signed)
Pt request

## 2012-10-06 ENCOUNTER — Ambulatory Visit (INDEPENDENT_AMBULATORY_CARE_PROVIDER_SITE_OTHER): Payer: Medicare Other | Admitting: Family Medicine

## 2012-10-06 ENCOUNTER — Telehealth: Payer: Self-pay | Admitting: *Deleted

## 2012-10-06 ENCOUNTER — Ambulatory Visit (INDEPENDENT_AMBULATORY_CARE_PROVIDER_SITE_OTHER): Payer: Medicare Other

## 2012-10-06 ENCOUNTER — Encounter: Payer: Self-pay | Admitting: Family Medicine

## 2012-10-06 VITALS — BP 142/91 | HR 104 | Wt 160.0 lb

## 2012-10-06 DIAGNOSIS — R109 Unspecified abdominal pain: Secondary | ICD-10-CM

## 2012-10-06 DIAGNOSIS — J449 Chronic obstructive pulmonary disease, unspecified: Secondary | ICD-10-CM

## 2012-10-06 DIAGNOSIS — R319 Hematuria, unspecified: Secondary | ICD-10-CM

## 2012-10-06 DIAGNOSIS — N4289 Other specified disorders of prostate: Secondary | ICD-10-CM

## 2012-10-06 DIAGNOSIS — N200 Calculus of kidney: Secondary | ICD-10-CM

## 2012-10-06 DIAGNOSIS — I7789 Other specified disorders of arteries and arterioles: Secondary | ICD-10-CM

## 2012-10-06 LAB — POCT URINALYSIS DIPSTICK
Glucose, UA: NEGATIVE
Nitrite, UA: NEGATIVE
Protein, UA: 30
Urobilinogen, UA: 0.2
pH, UA: 7

## 2012-10-06 MED ORDER — OXYCODONE-ACETAMINOPHEN 10-325 MG PO TABS: 1.0000 | ORAL_TABLET | Freq: Four times a day (QID) | ORAL | Status: DC | PRN

## 2012-10-06 MED ORDER — ALBUTEROL SULFATE HFA 108 (90 BASE) MCG/ACT IN AERS: INHALATION_SPRAY | RESPIRATORY_TRACT | Status: DC

## 2012-10-06 MED ORDER — TAMSULOSIN HCL 0.4 MG PO CAPS: 0.4000 mg | ORAL_CAPSULE | Freq: Every day | ORAL | Status: DC

## 2012-10-06 NOTE — Progress Notes (Signed)
CC: Jimmy Mayo is a 62 y.o. male is here for blood in urine x several days and Flank Pain   Subjective: HPI:  Patient reports that yesterday his morning urination was the color of cola throughout the day as he increased fluids and his diet the urine became much more straw colored and clear. This morning he had a small clot in his urine along with pink hue which is also now improved as the days progressed and he is increased fluid in his diet. He complains of a very mild heaviness in his right flank that radiates into the right testicle. Nothing particularly makes this better or worse there have been no interventions as of yet. He denies any dysuria penile discharge or testicular swelling he denies any recent trauma to any part of his body recently or remotely. He denies abdominal pain nor constipation or diarrhea. He has no nausea and there's been no vomiting his appetite is unchanged. He has a history of a right nephrolithiasis requiring lithotripsy. CT scan from May shows 7 mm stone in the right kidney that is nonobstructive. He denies fevers or chills   Review Of Systems Outlined In HPI  Past Medical History  Diagnosis Date  . GERD (gastroesophageal reflux disease)   . Anxiety   . Back pain   . Anemia   . Chronic kidney disease   . Kidney stone   . Asthma      Family History  Problem Relation Age of Onset  . Cancer Mother     colon  . Cancer Father     colon, prostate  . Diabetes Father   . Diabetes Maternal Grandmother      History  Substance Use Topics  . Smoking status: Current Every Day Smoker -- 1.50 packs/day for 35 years  . Smokeless tobacco: Not on file  . Alcohol Use: Yes     Objective: Filed Vitals:   10/06/12 1354  BP: 142/91  Pulse: 104    General: Alert and Oriented, No Acute Distress HEENT: Pupils equal, round, reactive to light. Conjunctivae clear.  Moist mucous membranes Lungs: Clear to auscultation bilaterally, no wheezing/ronchi/rales.   Comfortable work of breathing. Good air movement. Cardiac: Regular rate and rhythm. Normal S1/S2.  No murmurs, rubs, nor gallops.   Abdomen: Soft nontender Back: No CVA tenderness Extremities: No peripheral edema.  Strong peripheral pulses.  Mental Status: No depression, anxiety, nor agitation. Skin: Warm and dry.  Assessment & Plan: Jimmy Mayo was seen today for blood in urine x several days and flank pain.  Diagnoses and associated orders for this visit:  Hematuria - Urinalysis Dipstick - Urine Culture - CT Abdomen Pelvis Wo Contrast; Future - tamsulosin (FLOMAX) 0.4 MG CAPS capsule; Take 1 capsule (0.4 mg total) by mouth daily. - oxyCODONE-acetaminophen (PERCOCET) 10-325 MG per tablet; Take 1 tablet by mouth every 6 (six) hours as needed for pain.  Right flank pain - CT Abdomen Pelvis Wo Contrast; Future - tamsulosin (FLOMAX) 0.4 MG CAPS capsule; Take 1 capsule (0.4 mg total) by mouth daily. - oxyCODONE-acetaminophen (PERCOCET) 10-325 MG per tablet; Take 1 tablet by mouth every 6 (six) hours as needed for pain.  Nephrolithiasis - Ambulatory referral to Urology    Urinalysis confirms blood in the urine no other abnormalities. Highly suspicious that the stone seen on the May CT scan is now bstructing. CT scan was obtained today showing a 8.9 mm obstructing stone in the right renal pelvis. We have obtained urology consultation on Monday of  next week, I have discussed signs and symptoms that require emergent evaluation for the time being use as needed Percocet and daily Flomax, stone strainer provided, encouraged to push fluids.   Return if symptoms worsen or fail to improve.

## 2012-10-06 NOTE — Telephone Encounter (Signed)
PA obtained for CT ABD/Pelvis w/o contrast. Auth#  [id number].  Meyer Cory, LPN

## 2012-10-18 ENCOUNTER — Encounter: Payer: Self-pay | Admitting: Family Medicine

## 2012-10-18 ENCOUNTER — Ambulatory Visit (INDEPENDENT_AMBULATORY_CARE_PROVIDER_SITE_OTHER): Payer: Medicare Other | Admitting: Sports Medicine

## 2012-10-18 ENCOUNTER — Ambulatory Visit (INDEPENDENT_AMBULATORY_CARE_PROVIDER_SITE_OTHER): Payer: Medicare Other | Admitting: Family Medicine

## 2012-10-18 VITALS — BP 143/91 | HR 102 | Wt 160.0 lb

## 2012-10-18 DIAGNOSIS — M109 Gout, unspecified: Secondary | ICD-10-CM

## 2012-10-18 DIAGNOSIS — Z8739 Personal history of other diseases of the musculoskeletal system and connective tissue: Secondary | ICD-10-CM

## 2012-10-18 MED ORDER — INDOMETHACIN 50 MG PO CAPS: ORAL_CAPSULE | ORAL | Status: DC

## 2012-10-18 NOTE — Progress Notes (Signed)
CC: Jimmy Mayo is a 62 y.o. male is here for Gout   Subjective: HPI:  Complains of left great toe pain localized to the base of the toe described as pain moderate to severe in severity worse with weightbearing has gradually getting worse since onset on Monday. Feels like prior gout flares. Currently describes heat and swelling at the base of the toe slightly radiates medially proximally. Denies any recent injury or overuse. Reports he did drink some alcohol prior to the onset of this pain otherwise still takes cherry extract, tries to stay well-hydrated no increased meat in diet nor starting shellfish. He denies joint pain elsewhere fevers, chills, nausea, or upset stomach. No interventions as of yet   Review Of Systems Outlined In HPI  Past Medical History  Diagnosis Date  . GERD (gastroesophageal reflux disease)   . Anxiety   . Back pain   . Anemia   . Chronic kidney disease   . Kidney stone   . Asthma      Family History  Problem Relation Age of Onset  . Cancer Mother     colon  . Cancer Father     colon, prostate  . Diabetes Father   . Diabetes Maternal Grandmother      History  Substance Use Topics  . Smoking status: Current Every Day Smoker -- 1.50 packs/day for 35 years  . Smokeless tobacco: Not on file  . Alcohol Use: Yes     Objective: Filed Vitals:   10/18/12 1133  BP: 143/91  Pulse: 102    General: Alert and Oriented, No Acute Distress HEENT: Pupils equal, round, reactive to light. Conjunctivae clear.  Moist membranes Lungs: Clear comfortable work of breathing Cardiac: Regular rate and rhythm.  Extremities: Left foot is unremarkable except for mild swelling and moderate erythema at the first MCP there is tenderness at the dorsal medial aspect with palpation which is also were the above is most pronounced. Strong peripheral pulses. Mental Status: No depression, anxiety, nor agitation. Skin: Warm and dry.  Assessment & Plan: Jimmy Mayo was seen today for  gout.  Diagnoses and associated orders for this visit:  Gout attack - indomethacin (INDOCIN) 50 MG capsule; One by mouth three times a day until gout improved, then one twice a day for one day, then just one on the final day.    We discussed interventions including intra-articular steroid injection, prednisone, indomethacin, colchicine. He would prefer an injection, my colleague Dr. Karie Schwalbe. was available to provide him with this intervention today based on my consult. Patient will be provided with indomethacin should symptoms flare while he is on vacation in [place] he these coming days.  Return if symptoms worsen or fail to improve.

## 2012-10-18 NOTE — Assessment & Plan Note (Signed)
Left first MTP injection for podagra. Return in one month.

## 2012-10-18 NOTE — Progress Notes (Signed)
  Procedure: Real-time Ultrasound Guided Injection of left first metatarsophalangeal joint Device: GE Logiq E  Verbal informed consent obtained.  Time-out conducted.  Noted no overlying erythema, induration, or other signs of local infection.  Skin prepped in a sterile fashion.  Local anesthesia: Topical Ethyl chloride.  With sterile technique and under real time ultrasound guidance:  Noted significant joint effusion, 0.5 cc Kenalog 40 and 1 cc lidocaine injected easily into the joint. Completed without difficulty  Pain immediately resolved suggesting accurate placement of the medication.  Advised to call if fevers/chills, erythema, induration, drainage, or persistent bleeding.  Images permanently stored and available for review in the ultrasound unit.  Impression: Technically successful ultrasound guided injection.

## 2012-11-24 ENCOUNTER — Encounter: Payer: Self-pay | Admitting: Sports Medicine

## 2012-11-24 ENCOUNTER — Ambulatory Visit (INDEPENDENT_AMBULATORY_CARE_PROVIDER_SITE_OTHER): Payer: Medicare Other | Admitting: Sports Medicine

## 2012-11-24 VITALS — BP 147/93 | HR 118 | Wt 157.0 lb

## 2012-11-24 DIAGNOSIS — H04129 Dry eye syndrome of unspecified lacrimal gland: Secondary | ICD-10-CM

## 2012-11-24 DIAGNOSIS — H04123 Dry eye syndrome of bilateral lacrimal glands: Secondary | ICD-10-CM

## 2012-11-24 DIAGNOSIS — Z8739 Personal history of other diseases of the musculoskeletal system and connective tissue: Secondary | ICD-10-CM

## 2012-11-24 DIAGNOSIS — M109 Gout, unspecified: Secondary | ICD-10-CM

## 2012-11-24 DIAGNOSIS — I10 Essential (primary) hypertension: Secondary | ICD-10-CM

## 2012-11-24 MED ORDER — AMLODIPINE BESYLATE 10 MG PO TABS: 10.0000 mg | ORAL_TABLET | Freq: Every day | ORAL | Status: DC

## 2012-11-24 MED ORDER — OLOPATADINE HCL 0.1 % OP SOLN: 1.0000 [drp] | Freq: Two times a day (BID) | OPHTHALMIC | Status: DC

## 2012-11-24 MED ORDER — ALLOPURINOL 300 MG PO TABS: 300.0000 mg | ORAL_TABLET | Freq: Two times a day (BID) | ORAL | Status: DC

## 2012-11-24 MED ORDER — LOSARTAN POTASSIUM 50 MG PO TABS: 50.0000 mg | ORAL_TABLET | Freq: Every day | ORAL | Status: DC

## 2012-11-24 NOTE — Assessment & Plan Note (Addendum)
He was recently started on hydrochlorothiazide which can precipitate a gout flare, I'm going to switch him to valsartan and amlodipine. He will follow this up further with his PCP.

## 2012-11-24 NOTE — Progress Notes (Signed)
  Subjective:    CC: Follow up  HPI: Gout: Acute flare resolved with a single intra-articular injection to the metatarsophalangeal joint. His uric acid levels were elevated, of note he was also recently started on hydrochlorothiazide.  Hypertension: Recently started on hydrochlorothiazide which may have contributed to a gout flare. Blood pressure is somewhat elevated today and he is tachycardic, but no chest pain, shortness of breath, visual changes, headache.  Xerophthalmia: Needs refill on eyedrops, this is stable and well controlled.  Past medical history, Surgical history, Family history not pertinant except as noted below, Social history, Allergies, and medications have been entered into the medical record, reviewed, and no changes needed.   Review of Systems: No fevers, chills, night sweats, weight loss, chest pain, or shortness of breath.   Objective:    General: Well Developed, well nourished, and in no acute distress.  Neuro: Alert and oriented x3, extra-ocular muscles intact, sensation grossly intact.  HEENT: Normocephalic, atraumatic, pupils equal round reactive to light, neck supple, no masses, no lymphadenopathy, thyroid nonpalpable.  Skin: Warm and dry, no rashes. Cardiac: Regular rate and rhythm, no murmurs rubs or gallops, no lower extremity edema.  Respiratory: Clear to auscultation bilaterally. Not using accessory muscles, speaking in full sentences.  Impression and Recommendations:

## 2012-11-24 NOTE — Assessment & Plan Note (Signed)
Resolved after metatarsophalangeal joint injection and indomethacin. I am going to start allopurinol, goal uric acid level is less than 5. He was recently started on hydrochlorothiazide which can precipitate a gout flare, I'm going to switch him to valsartan and amlodipine.

## 2012-11-24 NOTE — Assessment & Plan Note (Signed)
Refilling Patanol.

## 2012-12-04 ENCOUNTER — Encounter: Payer: Self-pay | Admitting: Family Medicine

## 2012-12-13 ENCOUNTER — Telehealth: Payer: Self-pay | Admitting: Family Medicine

## 2012-12-13 NOTE — Telephone Encounter (Signed)
Patient called and request to have a lab order fax down to [facility] lab pt states he will go to lab between tomorrow and Friday. Pt has a cpe on Monday 12/18/12 Thanks

## 2012-12-14 ENCOUNTER — Telehealth: Payer: Self-pay | Admitting: *Deleted

## 2012-12-14 DIAGNOSIS — Z299 Encounter for prophylactic measures, unspecified: Secondary | ICD-10-CM

## 2012-12-14 NOTE — Telephone Encounter (Signed)
labs

## 2012-12-14 NOTE — Telephone Encounter (Signed)
Labs faxed downstairs

## 2012-12-14 NOTE — Telephone Encounter (Signed)
Sue Lush, Can you please provide Lipid Panel, Uric Acid, BMP, and A1c lab slips.

## 2012-12-15 LAB — LIPID PANEL: Total CHOL/HDL Ratio: 3.7 Ratio

## 2012-12-15 LAB — HEMOGLOBIN A1C
Hgb A1c MFr Bld: 5.3 % (ref <5.7)
Mean Plasma Glucose: 105 mg/dL (ref <117)

## 2012-12-15 LAB — BASIC METABOLIC PANEL WITH GFR
BUN: 12 mg/dL (ref 6–23)
Creat: 0.88 mg/dL (ref 0.50–1.35)
GFR, Est African American: >89 mL/min
GFR, Est Non African American: >89 mL/min
Glucose, Bld: 94 mg/dL (ref 70–99)

## 2012-12-18 ENCOUNTER — Ambulatory Visit (INDEPENDENT_AMBULATORY_CARE_PROVIDER_SITE_OTHER): Payer: Medicare Other | Admitting: Family Medicine

## 2012-12-18 ENCOUNTER — Encounter: Payer: Self-pay | Admitting: Family Medicine

## 2012-12-18 VITALS — BP 147/88 | HR 96 | Ht 68.5 in | Wt 157.0 lb

## 2012-12-18 DIAGNOSIS — E781 Pure hyperglyceridemia: Secondary | ICD-10-CM

## 2012-12-18 DIAGNOSIS — Z0000 Encounter for general adult medical examination without abnormal findings: Secondary | ICD-10-CM

## 2012-12-18 DIAGNOSIS — Z125 Encounter for screening for malignant neoplasm of prostate: Secondary | ICD-10-CM

## 2012-12-18 DIAGNOSIS — I10 Essential (primary) hypertension: Secondary | ICD-10-CM

## 2012-12-18 DIAGNOSIS — E785 Hyperlipidemia, unspecified: Secondary | ICD-10-CM

## 2012-12-18 MED ORDER — ICOSAPENT ETHYL 1 G PO CAPS: ORAL_CAPSULE | ORAL | Status: DC

## 2012-12-18 NOTE — Progress Notes (Deleted)
  Subjective:    Jimmy Mayo is a 62 y.o. male who presents for a welcome to Medicare exam.   Cardiac risk factors: {risk factors:510}.  Depression Screen (Note: if answer to either of the following is "Yes", a more complete depression screening is indicated)  Q1: Over the past two weeks, have you felt down, depressed or hopeless? {yes/no:311178} Q2: Over the past two weeks, have you felt little interest or pleasure in doing things? {yes/no:311178}  Activities of Daily Living In your present state of health, do you have any difficulty performing the following activities?:  Preparing food and eating?: {yes/no (default no):140031::"No"} Bathing yourself: {yes/no (default no):140031::"No"} Getting dressed: {yes/no (default no):140031::"No"} Using the toilet:{yes/no (default no):140031::"No"} Moving around from place to place: {yes/no (default no):140031::"No"} In the past year have you fallen or had a near fall?:{yes/no (default no):140031::"No"}  Current exercise habits: {exercise:19826}  Dietary issues discussed: ***  Hearing difficulties: {yes/no (default no):140031::"No"} Safe in current home environment: {yes no free text:314490}  {Common ambulatory SmartLinks:19316} Review of Systems {ros; complete:30496}    Objective:     Vision by Snellen chart: right eye:{vision:19455::"20/20"}, left eye:{vision:19455::"20/20"} Blood pressure 147/88, pulse 96, height 5' 8.5" (1.74 m), weight 157 lb (71.215 kg). Body mass index is 23.52 kg/(m^2). {Exam, ZOXW:96045}    Assessment:    ***     Plan:     During the course of the visit the patient was educated and counseled about appropriate screening and preventive services including:   {plan:19837}  Patient Instructions (the written plan) was given to the patient.

## 2012-12-18 NOTE — Progress Notes (Signed)
Subjective:    Jimmy Mayo is a 62 y.o. male who presents for Medicare Annual/Subsequent preventive examination.   Preventive Screening-Counseling & Management  Colonoscopy: Colonoscopy 2014 repeat 2017 Prostate: Discussed screening risks/beneifts with patient on 12/29/2011. PSA 1.72 2012, 0.5 2011. Father Dx with Prostate Cancer at age 33s. We will continue to get annual PSAs AAA Screen: (At 62 yo w/ Hx of Smoking) Influenza Vaccine: received this year Pneumovax: UTD Td/Tdap: Td 2009 UTD ` Zoster:Rx given last year  Tobacco History  Smoking status  . Current Every Day Smoker -- 1.50 packs/day for 35 years  Smokeless tobacco  . Not on file    Problems Prior to Visit 1. Gout, HTN  Current Problems (verified) Patient Active Problem List   Diagnosis Date Noted  . Pigmented skin lesion 09/27/2012  . Dry eye 09/27/2012  . Personal history of colonic polyps 06/01/2012  . Abdominal pain, epigastric 06/01/2012  . Hyperlipidemia 02/16/2012  . Prediabetes 02/16/2012  . Seborrheic keratosis 02/16/2012  . Anxiety 02/15/2012  . History of gout 12/29/2011  . Depression 12/29/2011  . Family history of prostate cancer 12/29/2011  . Essential hypertension, benign 02/23/2010  . NEPHROLITHIASIS 10/15/2009  . MECHANICAL PTOSIS 12/09/2008  . COPD 08/04/2007  . NICOTINE ADDICTION 08/02/2007  . HEMATURIA UNSPECIFIED 08/02/2007  . DISC DISEASE, LUMBAR 01/04/2007  . INSOMNIA 01/04/2007    Medications Prior to Visit Current Outpatient Prescriptions on File Prior to Visit  Medication Sig Dispense Refill  . albuterol (PROAIR HFA) 108 (90 BASE) MCG/ACT inhaler INHALE 2 PUFFS EVERY 6 HOURS AS NEEDED  3 Inhaler  11  . allopurinol (ZYLOPRIM) 300 MG tablet Take 1 tablet (300 mg total) by mouth 2 (two) times daily.  60 tablet  3  . ALPRAZolam (XANAX) 0.5 MG tablet TAKE 1/2 TABLET BY MOUTH TWICE A DAY AND 1 TABLET AT NIGHT  180 tablet  0  . esomeprazole (NEXIUM) 40 MG capsule Take 1 capsule  (40 mg total) by mouth daily before breakfast.  90 capsule  1  . Eszopiclone (ESZOPICLONE) 3 MG TABS Take 1 tablet (3 mg total) by mouth at bedtime. Take immediately before bedtime  30 tablet  2  . finasteride (PROSCAR) 5 MG tablet Take 1 tablet (5 mg total) by mouth daily.  90 tablet  3  . fish oil-omega-3 fatty acids 1000 MG capsule Take 2 g by mouth daily.        . Multiple Vitamin (MULTIVITAMIN) capsule Take 1 capsule by mouth daily.        [name] olopatadine (PATANOL) 0.1 % ophthalmic solution Place 1 drop into both eyes 2 (two) times daily.  5 mL  12  . amLODipine (NORVASC) 10 MG tablet Take 0.5 tablets (5 mg total) by mouth daily.  30 tablet  3  . fenofibrate (TRICOR) 48 MG tablet One tablet daily for four weeks, then two tablets daily for four weeks. Then call for single tablet dosing.  84 tablet  0  . indomethacin (INDOCIN) 50 MG capsule One by mouth three times a day until gout improved, then one twice a day for one day, then just one on the final day.  15 capsule  0  . losartan (COZAAR) 50 MG tablet Take 0.5 tablets (25 mg total) by mouth daily.  30 tablet  3  . tamsulosin (FLOMAX) 0.4 MG CAPS capsule Take 1 capsule (0.4 mg total) by mouth daily.  30 capsule  1  . traMADol (ULTRAM) 50 MG tablet Take 50 mg by  mouth. Take 1-2 tabs by mouth every 8 hours       No current facility-administered medications on file prior to visit.    Current Medications (verified) Current Outpatient Prescriptions  Medication Sig Dispense Refill  . albuterol (PROAIR HFA) 108 (90 BASE) MCG/ACT inhaler INHALE 2 PUFFS EVERY 6 HOURS AS NEEDED  3 Inhaler  11  . allopurinol (ZYLOPRIM) 300 MG tablet Take 1 tablet (300 mg total) by mouth 2 (two) times daily.  60 tablet  3  . ALPRAZolam (XANAX) 0.5 MG tablet TAKE 1/2 TABLET BY MOUTH TWICE A DAY AND 1 TABLET AT NIGHT  180 tablet  0  . esomeprazole (NEXIUM) 40 MG capsule Take 1 capsule (40 mg total) by mouth daily before breakfast.  90 capsule  1  . Eszopiclone  (ESZOPICLONE) 3 MG TABS Take 1 tablet (3 mg total) by mouth at bedtime. Take immediately before bedtime  30 tablet  2  . finasteride (PROSCAR) 5 MG tablet Take 1 tablet (5 mg total) by mouth daily.  90 tablet  3  . fish oil-omega-3 fatty acids 1000 MG capsule Take 2 g by mouth daily.        . Multiple Vitamin (MULTIVITAMIN) capsule Take 1 capsule by mouth daily.        [name] olopatadine (PATANOL) 0.1 % ophthalmic solution Place 1 drop into both eyes 2 (two) times daily.  5 mL  12  . amLODipine (NORVASC) 10 MG tablet Take 0.5 tablets (5 mg total) by mouth daily.  30 tablet  3  . fenofibrate (TRICOR) 48 MG tablet One tablet daily for four weeks, then two tablets daily for four weeks. Then call for single tablet dosing.  84 tablet  0  . indomethacin (INDOCIN) 50 MG capsule One by mouth three times a day until gout improved, then one twice a day for one day, then just one on the final day.  15 capsule  0  . losartan (COZAAR) 50 MG tablet Take 0.5 tablets (25 mg total) by mouth daily.  30 tablet  3  . tamsulosin (FLOMAX) 0.4 MG CAPS capsule Take 1 capsule (0.4 mg total) by mouth daily.  30 capsule  1  . traMADol (ULTRAM) 50 MG tablet Take 50 mg by mouth. Take 1-2 tabs by mouth every 8 hours       No current facility-administered medications for this visit.     Allergies (verified) Amoxicillin; Niacin and related; Penicillins; and Pravastatin sodium   PAST HISTORY  Family History Family History  Problem Relation Age of Onset  . Cancer Mother     colon  . Cancer Father     colon, prostate  . Diabetes Father   . Diabetes Maternal Grandmother     Social History History  Substance Use Topics  . Smoking status: Current Every Day Smoker -- 1.50 packs/day for 35 years  . Smokeless tobacco: Not on file  . Alcohol Use: Yes    Are there smokers in your home (other than you)?  No  Risk Factors Current exercise habits: Gym/ health club routine includes cardio.  Dietary issues discussed: DASH  diet   Cardiac risk factors: advanced age (older than 72 for men, 29 for women), dyslipidemia, hypertension and smoking/ tobacco exposure.  Depression Screen (Note: if answer to either of the following is "Yes", a more complete depression screening is indicated)   Q1: Over the past two weeks, have you felt down, depressed or hopeless? No  Q2: Over the past two weeks,  have you felt little interest or pleasure in doing things? No  Have you lost interest or pleasure in daily life? No  Do you often feel hopeless? No  Do you cry easily over simple problems? No  Activities of Daily Living In your present state of health, do you have any difficulty performing the following activities?:  Driving? No Managing money?  No Feeding yourself? No Getting from bed to chair? No Climbing a flight of stairs? No Preparing food and eating?: No Bathing or showering? No Getting dressed: No Getting to the toilet? No Using the toilet:No Moving around from place to place: No In the past year have you fallen or had a near fall?:No   Are you sexually active?  No  Do you have more than one partner?  No  Hearing Difficulties: Yes Do you often ask people to speak up or repeat themselves? No Do you experience ringing or noises in your ears? Yes Do you have difficulty understanding soft or whispered voices? No   Do you feel that you have a problem with memory? No  Do you often misplace items? No  Do you feel safe at home?  No  Cognitive Testing  Alert? Yes  Normal Appearance?Yes  Oriented to person? Yes  Place? Yes   Time? Yes  Recall of three objects?  Yes  Can perform simple calculations? Yes  Displays appropriate judgment?Yes  Can read the correct time from a watch face?Yes   Advanced Directives have been discussed with the patient? Yes   List the Names of Other Physician/Practitioners you currently use: 1.  Dr. Loraine Leriche urology, Dr. Channing Mutters neurosurgery  Indicate any recent Medical Services you may  have received from other than [facility] providers in the past year (date may be approximate).  Immunization History  Administered Date(s) Administered  . H1N1 12/26/2007  . Influenza Split 09/29/2010, 10/21/2011  . Influenza Whole 01/04/2007, 12/09/2008, 10/15/2009  . Influenza,inj,Quad PF,36+ Mos 09/27/2012  . Pneumococcal Polysaccharide-23 12/29/2011  . Td 08/02/2007  . Zoster 01/07/2012    Screening Tests Health Maintenance  Topic Date Due  . Influenza Vaccine  08/11/2013  . Tetanus/tdap  08/01/2017  . Colonoscopy  06/01/2022  . Zostavax  Completed    All answers were reviewed with the patient and necessary referrals were made:  Laren Boom, DO   12/18/2012   History reviewed: allergies, current medications, past family history, past medical history, past social history, past surgical history and problem list  Review of Systems A comprehensive review of systems was negative.    Objective:     Vision by Snellen chart: right eye:20/20, left eye:20/100 Blood pressure 147/88, pulse 96, height 5' 8.5" (1.74 m), weight 157 lb (71.215 kg). Body mass index is 23.52 kg/(m^2).  General: No Acute Distress HEENT: Atraumatic, normocephalic, conjunctivae normal without scleral icterus.  No nasal discharge, hearing grossly intact, TMs with good landmarks bilaterally with no middle ear abnormalities, posterior pharynx clear without oral lesions. Neck: Supple, trachea midline, no cervical nor supraclavicular adenopathy. Pulmonary: Clear to auscultation bilaterally without wheezing, rhonchi, nor rales. Cardiac: Regular rate and rhythm.  No murmurs, rubs, nor gallops. No peripheral edema.  2+ peripheral pulses bilaterally. Abdomen: Bowel sounds normal.  No masses.  Non-tender without rebound.  Negative Murphy's sign. GU: No inguinal hernia  MSK: Grossly intact, no signs of weakness.  Full strength throughout upper and lower extremities.  Full ROM in upper and lower extremities.  No midline  spinal tenderness. Neuro: Gait unremarkable, CN II-XII grossly  intact.  C5-C6 Reflex 2/4 Bilaterally, L4 Reflex 2/4 Bilaterally.  Cerebellar function intact. Skin: No rashes. Psych: Alert and oriented to person/place/time.  Thought process normal. No anxiety/depression.      Assessment:     Up to date on preventative measures once PSA obtained     Plan:     During the course of the visit the patient was educated and counseled about appropriate screening and preventive services including:    Influenza vaccine  Prostate cancer screening  Colorectal cancer screening  Nutrition counseling   Smoking cessation counseling  Diet review for nutrition referral?  Not Indicated ____   Patient Instructions (the written plan) was given to the patient.  Medicare Attestation I have personally reviewed: The patient's medical and social history Their use of alcohol, tobacco or illicit drugs Their current medications and supplements The patient's functional ability including ADLs,fall risks, home safety risks, cognitive, and hearing and visual impairment Diet and physical activities Evidence for depression or mood disorders  The patient's weight, height, BMI, and visual acuity have been recorded in the chart.  I have made referrals, counseling, and provided education to the patient based on review of the above and I have provided the patient with a written personalized care plan for preventive services.     Laren Boom, DO   12/18/2012

## 2012-12-25 ENCOUNTER — Other Ambulatory Visit: Payer: Self-pay | Admitting: *Deleted

## 2012-12-25 ENCOUNTER — Telehealth: Payer: Self-pay | Admitting: Family Medicine

## 2012-12-25 DIAGNOSIS — J449 Chronic obstructive pulmonary disease, unspecified: Secondary | ICD-10-CM

## 2012-12-25 MED ORDER — ALPRAZOLAM 0.5 MG PO TABS: ORAL_TABLET | ORAL | Status: DC

## 2012-12-25 MED ORDER — ESZOPICLONE 3 MG PO TABS: 3.0000 mg | ORAL_TABLET | Freq: Every day | ORAL | Status: DC

## 2012-12-25 NOTE — Telephone Encounter (Signed)
[facility] req 

## 2013-01-12 ENCOUNTER — Ambulatory Visit (INDEPENDENT_AMBULATORY_CARE_PROVIDER_SITE_OTHER): Payer: Medicare Other | Admitting: Family Medicine

## 2013-01-12 ENCOUNTER — Encounter: Payer: Self-pay | Admitting: Family Medicine

## 2013-01-12 VITALS — BP 140/86 | HR 80 | Temp 98.1°F | Wt 158.0 lb

## 2013-01-12 DIAGNOSIS — A499 Bacterial infection, unspecified: Secondary | ICD-10-CM

## 2013-01-12 DIAGNOSIS — B9689 Other specified bacterial agents as the cause of diseases classified elsewhere: Secondary | ICD-10-CM

## 2013-01-12 DIAGNOSIS — J329 Chronic sinusitis, unspecified: Secondary | ICD-10-CM

## 2013-01-12 MED ORDER — AZITHROMYCIN 250 MG PO TABS: ORAL_TABLET | ORAL | Status: AC

## 2013-01-12 NOTE — Progress Notes (Signed)
CC: Jimmy Mayo is a 63 y.o. male is here for Sinusitis   Subjective: HPI:  Complains of facial pressure below both eyes that has been present for the past week worsening on a daily basis accompanied by nasal congestion and subjective postnasal drip ALT which is moderate in severity. Symptoms have not improved with Benadryl no other interventions. Symptoms are present all hours of the day and worse in the morning. Accompanied by productive cough without blood and sputum. Denies chest pain, shortness of breath, wheezing, confusion, nor motor or sensory disturbances   Review Of Systems Outlined In HPI  Past Medical History  Diagnosis Date  . GERD (gastroesophageal reflux disease)   . Anxiety   . Back pain   . Anemia   . Chronic kidney disease   . Kidney stone   . Asthma      Family History  Problem Relation Age of Onset  . Cancer Mother     colon  . Cancer Father     colon, prostate  . Diabetes Father   . Diabetes Maternal Grandmother      History  Substance Use Topics  . Smoking status: Current Every Day Smoker -- 1.50 packs/day for 35 years  . Smokeless tobacco: Not on file  . Alcohol Use: Yes     Objective: Filed Vitals:   01/12/13 1426  BP: 140/86  Pulse: 80  Temp: 98.1 F (36.7 C)    General: Alert and Oriented, No Acute Distress HEENT: Pupils equal, round, reactive to light. Conjunctivae clear.  External ears unremarkable, canals clear with intact TMs with appropriate landmarks.  Middle ear appears open without effusion. Boggy erythematous inferior turbinates with moderate mucoid discharge.  Moist mucous membranes, pharynx without inflammation nor lesions however moderate cobblestoning  Neck supple without palpable lymphadenopathy nor abnormal masses. Lungs: Central rhonchi without signs of consolidation, rales, wheezing. He shows comfortable work of breathing  Cardiac: Regular rate and rhythm. Normal S1/S2.  No murmurs, rubs, nor gallops.   Mental Status: No  depression, anxiety, nor agitation. Skin: Warm and dry.  Assessment & Plan: Jimmy Mayo was seen today for sinusitis.  Diagnoses and associated orders for this visit:  Bacterial sinusitis - azithromycin (ZITHROMAX) 250 MG tablet; Take two tabs at once on day 1, then one tab daily on days 2-5.    Bacterial sinusitis with acute bronchitis. Start antimicrobial therapy, he would prefer azithromycin instead of doxycycline, he has a penicillin allergy. I've asked him to call me on Monday if no better and we'll switch to doxycycline. Additionally add Alka-Seltzer cold and sinus for symptoms along with nasal saline washes  Return if symptoms worsen or fail to improve.

## 2013-02-06 ENCOUNTER — Encounter: Payer: Self-pay | Admitting: Family Medicine

## 2013-02-15 ENCOUNTER — Telehealth: Payer: Self-pay | Admitting: *Deleted

## 2013-02-15 NOTE — Telephone Encounter (Signed)
He gave me the number;letter faxed to [phone] with ok confirmation. Phone # is [phone]

## 2013-02-15 NOTE — Telephone Encounter (Signed)
Jimmy Mayo, Will you please let Jimmy Mayo know that I'll write a letter but does he happen to know if there was an address or fax number that we can send this to? Often this is included on the denial of coverage letter sent to patients.  (placed in your inbox)

## 2013-02-15 NOTE — Telephone Encounter (Signed)
Pt called and wanted a letter stating the eszopiclone 3mg  is medically neccesary for him to be on. Apparently he received a letter in the mail stating this needed to be done in order for the insurance to pay for it.

## 2013-02-16 ENCOUNTER — Telehealth: Payer: Self-pay | Admitting: *Deleted

## 2013-02-16 NOTE — Telephone Encounter (Signed)
Eszopiclone approved through 01/10/14.  Pharm notified.

## 2013-03-02 ENCOUNTER — Other Ambulatory Visit: Payer: Self-pay | Admitting: *Deleted

## 2013-03-02 MED ORDER — ESZOPICLONE 3 MG PO TABS: 3.0000 mg | ORAL_TABLET | Freq: Every day | ORAL | Status: DC

## 2013-03-21 ENCOUNTER — Telehealth: Payer: Self-pay | Admitting: Family Medicine

## 2013-03-21 MED ORDER — ALPRAZOLAM 0.5 MG PO TABS: ORAL_TABLET | ORAL | Status: DC

## 2013-03-21 NOTE — Telephone Encounter (Signed)
rx req

## 2013-05-21 ENCOUNTER — Other Ambulatory Visit: Payer: Self-pay | Admitting: Family Medicine

## 2013-06-01 ENCOUNTER — Other Ambulatory Visit: Payer: Self-pay | Admitting: Family Medicine

## 2013-06-20 ENCOUNTER — Other Ambulatory Visit: Payer: Self-pay | Admitting: Family Medicine

## 2013-07-10 ENCOUNTER — Other Ambulatory Visit: Payer: Self-pay | Admitting: Sports Medicine

## 2013-08-20 ENCOUNTER — Other Ambulatory Visit: Payer: Self-pay | Admitting: Family Medicine

## 2013-09-19 ENCOUNTER — Other Ambulatory Visit: Payer: Self-pay | Admitting: Family Medicine

## 2013-10-17 ENCOUNTER — Other Ambulatory Visit: Payer: Self-pay | Admitting: Family Medicine

## 2013-10-18 ENCOUNTER — Encounter: Payer: Self-pay | Admitting: Family Medicine

## 2013-10-18 ENCOUNTER — Ambulatory Visit (INDEPENDENT_AMBULATORY_CARE_PROVIDER_SITE_OTHER): Payer: Medicare Other | Admitting: Family Medicine

## 2013-10-18 ENCOUNTER — Ambulatory Visit (INDEPENDENT_AMBULATORY_CARE_PROVIDER_SITE_OTHER): Payer: Medicare Other

## 2013-10-18 VITALS — BP 136/86 | HR 107 | Ht 68.5 in | Wt 155.0 lb

## 2013-10-18 DIAGNOSIS — M7580 Other shoulder lesions, unspecified shoulder: Secondary | ICD-10-CM

## 2013-10-18 DIAGNOSIS — E781 Pure hyperglyceridemia: Secondary | ICD-10-CM

## 2013-10-18 DIAGNOSIS — Z0000 Encounter for general adult medical examination without abnormal findings: Secondary | ICD-10-CM

## 2013-10-18 DIAGNOSIS — R0989 Other specified symptoms and signs involving the circulatory and respiratory systems: Secondary | ICD-10-CM

## 2013-10-18 DIAGNOSIS — J439 Emphysema, unspecified: Secondary | ICD-10-CM

## 2013-10-18 DIAGNOSIS — Z8042 Family history of malignant neoplasm of prostate: Secondary | ICD-10-CM

## 2013-10-18 DIAGNOSIS — Z23 Encounter for immunization: Secondary | ICD-10-CM

## 2013-10-18 MED ORDER — ALPRAZOLAM 0.5 MG PO TABS: ORAL_TABLET | ORAL | Status: DC

## 2013-10-18 MED ORDER — ICOSAPENT ETHYL 1 G PO CAPS: 2.0000 g | ORAL_CAPSULE | Freq: Two times a day (BID) | ORAL | Status: DC

## 2013-10-18 NOTE — Progress Notes (Signed)
Subjective:    Jimmy Mayo is a 62 y.o. male who presents for Medicare Annual/Subsequent preventive examination.   Preventive Screening-Counseling & Management  Tobacco History  Smoking status  . Current Every Day Smoker -- 1.50 packs/day for 35 years  Smokeless tobacco  . Not on file    Colonoscopy: Colonoscopy 2014 repeat 2017  Prostate: Discussed screening risks/beneifts with patient on 12/29/2011. PSA 1.72 2012, 0.5 2011. Father Dx with Prostate Cancer at age 61s. We will continue to get annual PSAs   Influenza Vaccine: Today Pneumovax: UTD x 2 Td/Tdap: Td 2009 UTD  Zoster:Rx given 2013   Problems Prior to Visit 1. complaint of bilateral shoulder pain that is worse with any bench pressing, present on a daily basis for the past 3-4 months. Worse with lying on the shoulder. Nothing else and make better or worse.  He's been taking fish oil and daily basis but looking back at prior lipid panels he's never really had any benefit from taking this with respect to triglycerides improving. He wants no fevers or stronger medication to help with triglycerides.  Is requesting a refill on Xanax last refill was only 30 days however usually gets in a 90 day supply for me.  Current Problems (verified) Patient Active Problem List   Diagnosis Date Noted  . Pigmented skin lesion 09/27/2012  . Dry eye 09/27/2012  . Personal history of colonic polyps 06/01/2012  . Abdominal pain, epigastric 06/01/2012  . Hyperlipidemia 02/16/2012  . Prediabetes 02/16/2012  . Seborrheic keratosis 02/16/2012  . Anxiety 02/15/2012  . History of gout 12/29/2011  . Depression 12/29/2011  . Family history of prostate cancer 12/29/2011  . Essential hypertension, benign 02/23/2010  . NEPHROLITHIASIS 10/15/2009  . MECHANICAL PTOSIS 12/09/2008  . COPD 08/04/2007  . NICOTINE ADDICTION 08/02/2007  . HEMATURIA UNSPECIFIED 08/02/2007  . [place] DISEASE, LUMBAR 01/04/2007  . INSOMNIA 01/04/2007    Medications  Prior to Visit Current Outpatient Prescriptions on File Prior to Visit  Medication Sig Dispense Refill  . allopurinol (ZYLOPRIM) 300 MG tablet TAKE 1 TABLET TWICE A DAY  60 tablet  11  . ALPRAZolam (XANAX) 0.5 MG tablet TAKE 1/2 TABLET TWICE DAILY AND 1 TABLET AT BEDTIME  60 tablet  0  . Eszopiclone 3 MG TABS TAKE 1 TABLET AT BEDTIME.  30 tablet  0  . finasteride (PROSCAR) 5 MG tablet TAKE 1 TABLET DAILY.  90 tablet  0  . fish oil-omega-3 fatty acids 1000 MG capsule Take 2 g by mouth daily.        . Multiple Vitamin (MULTIVITAMIN) capsule Take 1 capsule by mouth daily.        [name] olopatadine (PATANOL) 0.1 % ophthalmic solution Place 1 drop into both eyes 2 (two) times daily.  5 mL  12  . PROAIR HFA 108 (90 BASE) MCG/ACT inhaler INHALE 2 PUFFS EVERY 6 HOURS AS NEEDED (2 BOXES!!!)  25.5 g  2  . traMADol (ULTRAM) 50 MG tablet Take 50 mg by mouth. Take 1-2 tabs by mouth every 8 hours      . valsartan-hydrochlorothiazide (DIOVAN-HCT) 160-12.5 MG per tablet TAKE ONE TABLET DAILY  90 tablet  1   No current facility-administered medications on file prior to visit.    Current Medications (verified) Current Outpatient Prescriptions  Medication Sig Dispense Refill  . allopurinol (ZYLOPRIM) 300 MG tablet TAKE 1 TABLET TWICE A DAY  60 tablet  11  . ALPRAZolam (XANAX) 0.5 MG tablet TAKE 1/2 TABLET TWICE DAILY AND 1  TABLET AT BEDTIME  60 tablet  0  . Eszopiclone 3 MG TABS TAKE 1 TABLET AT BEDTIME.  30 tablet  0  . finasteride (PROSCAR) 5 MG tablet TAKE 1 TABLET DAILY.  90 tablet  0  . fish oil-omega-3 fatty acids 1000 MG capsule Take 2 g by mouth daily.        . Multiple Vitamin (MULTIVITAMIN) capsule Take 1 capsule by mouth daily.        [name] olopatadine (PATANOL) 0.1 % ophthalmic solution Place 1 drop into both eyes 2 (two) times daily.  5 mL  12  . PROAIR HFA 108 (90 BASE) MCG/ACT inhaler INHALE 2 PUFFS EVERY 6 HOURS AS NEEDED (2 BOXES!!!)  25.5 g  2  . traMADol (ULTRAM) 50 MG tablet Take 50 mg by mouth.  Take 1-2 tabs by mouth every 8 hours      . valsartan-hydrochlorothiazide (DIOVAN-HCT) 160-12.5 MG per tablet TAKE ONE TABLET DAILY  90 tablet  1   No current facility-administered medications for this visit.     Allergies (verified) Amoxicillin; Niacin and related; Penicillins; and Pravastatin sodium   PAST HISTORY  Family History Family History  Problem Relation Age of Onset  . Cancer Mother     colon  . Cancer Father     colon, prostate  . Diabetes Father   . Diabetes Maternal Grandmother     Social History History  Substance Use Topics  . Smoking status: Current Every Day Smoker -- 1.50 packs/day for 35 years  . Smokeless tobacco: Not on file  . Alcohol Use: Yes    Are there smokers in your home (other than you)?  No  Risk Factors Current exercise habits: Gym/ health club routine includes cardio and weights.  Dietary issues discussed:  DASH Diet   Cardiac risk factors: dyslipidemia, family history of premature cardiovascular disease, hypertension, sedentary lifestyle and smoking/ tobacco exposure.  Depression Screen (Note: if answer to either of the following is "Yes", a more complete depression screening is indicated)   Q1: Over the past two weeks, have you felt down, depressed or hopeless? Yes  Q2: Over the past two weeks, have you felt little interest or pleasure in doing things? No  Have you lost interest or pleasure in daily life? No  Do you often feel hopeless? No  Do you cry easily over simple problems? No  Activities of Daily Living In your present state of health, do you have any difficulty performing the following activities?:  Driving? No Managing money?  No Feeding yourself? No Getting from bed to chair? No Climbing a flight of stairs? No Preparing food and eating?: No Bathing or showering? No Getting dressed: No Getting to the toilet? No Using the toilet:No Moving around from place to place: No In the past year have you fallen or had a near  fall?:No   Are you sexually active?  No  Do you have more than one partner?  No  Hearing Difficulties: No Do you often ask people to speak up or repeat themselves? No Do you experience ringing or noises in your ears? No Do you have difficulty understanding soft or whispered voices? No   Do you feel that you have a problem with memory? No  Do you often misplace items? No  Do you feel safe at home?  No  Cognitive Testing  Alert? Yes  Normal Appearance?Yes  Oriented to person? Yes  Place? Yes   Time? Yes  Recall of three objects?  Yes  Can perform simple calculations? Yes  Displays appropriate judgment?Yes  Can read the correct time from a watch face?Yes   Advanced Directives have been discussed with the patient? Yes   List the Names of Other Physician/Practitioners you currently use: 1.  [facility] (GI)  Indicate any recent Medical Services you may have received from other than [facility] providers in the past year (date may be approximate).  Immunization History  Administered Date(s) Administered  . H1N1 12/26/2007  . Influenza Split 09/29/2010, 10/21/2011  . Influenza Whole 01/04/2007, 12/09/2008, 10/15/2009  . Influenza,inj,Quad PF,36+ Mos 09/27/2012, 10/18/2013  . Pneumococcal Polysaccharide-23 12/29/2011  . Td 08/02/2007  . Zoster 01/07/2012    Screening Tests Health Maintenance  Topic Date Due  . Influenza Vaccine  08/11/2013  . Tetanus/tdap  08/01/2017  . Colonoscopy  06/01/2022  . Zostavax  Completed    All answers were reviewed with the patient and necessary referrals were made:  Marcial Pacas, DO   10/18/2013   History reviewed: allergies, current medications, past family history, past medical history, past social history, past surgical history and problem list  Review of Systems Review of Systems - General ROS: negative for - chills, fever, night sweats, weight gain or weight loss Ophthalmic ROS: negative for - decreased vision Psychological ROS: negative for  - anxiety or depression ENT ROS: negative for - hearing change, nasal congestion, tinnitus or allergies Hematological and Lymphatic ROS: negative for - bleeding problems, bruising or swollen lymph nodes Breast ROS: negative Respiratory ROS: no cough, shortness of breath, or wheezing Cardiovascular ROS: no chest pain or dyspnea on exertion Gastrointestinal ROS: no abdominal pain, change in bowel habits, or black or bloody stools Genito-Urinary ROS: negative for - genital discharge, genital ulcers, incontinence or abnormal bleeding from genitals Musculoskeletal ROS: negative for - joint pain or muscle pain other than that described above Neurological ROS: negative for - headaches or memory loss Dermatological ROS: negative for lumps, mole changes, rash and skin lesion changes   Objective:     Vision by Snellen chart: right eye:20/20, left eye:20/20 Blood pressure 136/86, pulse 107, height 5' 8.5" (1.74 m), weight 155 lb (70.308 kg). Body mass index is 23.22 kg/(m^2).  General: No Acute Distress HEENT: Atraumatic, normocephalic, conjunctivae normal without scleral icterus.  No nasal discharge, hearing grossly intact, TMs with good landmarks bilaterally with no middle ear abnormalities, posterior pharynx clear without oral lesions. Neck: Supple, trachea midline, no cervical nor supraclavicular adenopathy. Pulmonary: Comfortable work of breathing without wheezing nor rales however rhonchi heard in the right lower mid lung field Cardiac: Regular rate and rhythm.  No murmurs, rubs, nor gallops. No peripheral edema.  2+ peripheral pulses bilaterally. Abdomen: Bowel sounds normal.  No masses.  Non-tender without rebound.  Negative Murphy's sign. MSK: Grossly intact, no signs of weakness.  Full strength throughout upper and lower extremities.  Full ROM in upper and lower extremities.  No midline spinal tenderness. Bilateral shoulder pain is reproduced with Luan Pulling or having him place the back of his  hand in the small of his back or reaching behind his shoulder blades. Neuro: Gait unremarkable, CN II-XII grossly intact.  C5-C6 Reflex 2/4 Bilaterally, L4 Reflex 2/4 Bilaterally.  Cerebellar function intact. Skin: No rashes, seborrheic keratosis on the left forehead and the back of the right ear Psych: Alert and oriented to person/place/time.  Thought process normal. No anxiety/depression.     Assessment:     Hypertriglyceridemia, high risk prostate cancer, abnormal lung sounds concerning for possible lung cancer in  this long-time smoker, obtain an x-ray today.  Bilateral rotator cuff tendinitis with suspicion of impingement syndrome he was given a handout of rehabilitative exercises to be performed on a daily basis the next 2-3 weeks.      Plan:     During the course of the visit the patient was educated and counseled about appropriate screening and preventive services including:    Influenza vaccine  Prostate cancer screening  Diabetes screening  Diet review for nutrition referral? Not indicated   Patient Instructions (the written plan) was given to the patient.  Medicare Attestation I have personally reviewed: The patient's medical and social history Their use of alcohol, tobacco or illicit drugs Their current medications and supplements The patient's functional ability including ADLs,fall risks, home safety risks, cognitive, and hearing and visual impairment Diet and physical activities Evidence for depression or mood disorders  The patient's weight, height, BMI, and visual acuity have been recorded in the chart.  I have made referrals, counseling, and provided education to the patient based on review of the above and I have provided the patient with a written personalized care plan for preventive services.     Marcial Pacas, DO   10/18/2013

## 2013-10-19 LAB — LIPID PANEL
CHOL/HDL RATIO: 3.2 ratio
CHOLESTEROL: 176 mg/dL (ref 0–200)
HDL: 55 mg/dL (ref >39)
LDL Cholesterol: 71 mg/dL (ref 0–99)
TRIGLYCERIDES: 248 mg/dL — AB (ref <150)
VLDL: 50 mg/dL — AB (ref 0–40)

## 2013-10-19 LAB — COMPLETE METABOLIC PANEL WITH GFR
ALK PHOS: 65 U/L (ref 39–117)
ALT: 38 U/L (ref 0–53)
AST: 36 U/L (ref 0–37)
Albumin: 4.6 g/dL (ref 3.5–5.2)
BUN: 12 mg/dL (ref 6–23)
CALCIUM: 10 mg/dL (ref 8.4–10.5)
CO2: 27 mEq/L (ref 19–32)
Chloride: 99 mEq/L (ref 96–112)
Creat: 0.88 mg/dL (ref 0.50–1.35)
GFR, Est African American: >89 mL/min
GLUCOSE: 98 mg/dL (ref 70–99)
Potassium: 4 mEq/L (ref 3.5–5.3)
Sodium: 137 mEq/L (ref 135–145)
Total Bilirubin: 1.2 mg/dL (ref 0.2–1.2)
Total Protein: 7 g/dL (ref 6.0–8.3)

## 2013-10-19 LAB — PSA: PSA: 0.82 ng/mL (ref <=4.00)

## 2013-11-08 ENCOUNTER — Encounter: Payer: Self-pay | Admitting: Family Medicine

## 2013-11-08 ENCOUNTER — Ambulatory Visit (INDEPENDENT_AMBULATORY_CARE_PROVIDER_SITE_OTHER): Payer: Medicare Other | Admitting: Family Medicine

## 2013-11-08 VITALS — BP 127/80 | HR 97 | Temp 98.0°F | Wt 158.0 lb

## 2013-11-08 DIAGNOSIS — R319 Hematuria, unspecified: Secondary | ICD-10-CM

## 2013-11-08 DIAGNOSIS — R310 Gross hematuria: Secondary | ICD-10-CM

## 2013-11-08 LAB — POCT URINALYSIS DIPSTICK
BILIRUBIN UA: NEGATIVE
Blood, UA: NEGATIVE
Glucose, UA: NEGATIVE
Ketones, UA: NEGATIVE
LEUKOCYTES UA: NEGATIVE
NITRITE UA: NEGATIVE
PH UA: 7
PROTEIN UA: NEGATIVE
Spec Grav, UA: 1.015
Urobilinogen, UA: 0.2

## 2013-11-08 NOTE — Progress Notes (Signed)
   Subjective:    Patient ID: Jimmy Mayo, male    DOB: 1950/01/24, 63 y.o.   MRN: [id number]  HPI  Woke up at 4AM and urinated. Says had gross blood in the urine.  Gradually got better.  No change in back pain.  No fever or chills.  No trauama or injury. He's never had this happen before.[name] He feels he well hydrated.   No bleeding from nose ow bowels.  No change in bruising. History of stones but says this was not painful like that . Still feels a little penile irritation today.     Review of Systems     Objective:   Physical Exam  Constitutional: He is oriented to person, place, and time. He appears well-developed and well-nourished.  HENT:  Head: Normocephalic and atraumatic.  Cardiovascular: Normal rate, regular rhythm and normal heart sounds.   Pulmonary/Chest: Effort normal and breath sounds normal.  Musculoskeletal:  No CVA tenderness.  Neurological: He is alert and oriented to person, place, and time.  Skin: Skin is warm and dry.  Psychiatric: He has a normal mood and affect. His behavior is normal.          Assessment & Plan:  Gross hematuria-unclear etiology. Urinalysis is completely clear today. We will send for a culture for confirmation as well as a microscopic review. If negative then I just encouraged him to monitor for recurrence. If it happens again and please give the os office a call and will refer him back to his urologist for possible cystoscopy. I discussed with him the differential. He's not had any other easy bruising or bleeding recently to indicate abnormality in the blood. He certainly not having pain consistent with kidney stones.

## 2013-11-08 NOTE — Addendum Note (Signed)
Addended by: Teddy Spike on: 11/08/2013 05:24 PM   Modules accepted: Orders

## 2013-11-09 LAB — URINALYSIS, ROUTINE W REFLEX MICROSCOPIC
Bilirubin Urine: NEGATIVE
Glucose, UA: NEGATIVE mg/dL
HGB URINE DIPSTICK: NEGATIVE
KETONES UR: NEGATIVE mg/dL
Leukocytes, UA: NEGATIVE
Nitrite: NEGATIVE
PH: 7 (ref 5.0–8.0)
PROTEIN: NEGATIVE mg/dL
Specific Gravity, Urine: 1.013 (ref 1.005–1.030)
UROBILINOGEN UA: 0.2 mg/dL (ref 0.0–1.0)

## 2013-11-10 LAB — URINE CULTURE
Colony Count: NO GROWTH
Organism ID, Bacteria: NO GROWTH

## 2013-11-19 ENCOUNTER — Other Ambulatory Visit: Payer: Self-pay | Admitting: Family Medicine

## 2013-12-07 ENCOUNTER — Other Ambulatory Visit: Payer: Self-pay | Admitting: Family Medicine

## 2013-12-19 ENCOUNTER — Other Ambulatory Visit: Payer: Self-pay | Admitting: Family Medicine

## 2014-01-17 ENCOUNTER — Other Ambulatory Visit: Payer: Self-pay | Admitting: Family Medicine

## 2014-01-17 ENCOUNTER — Telehealth: Payer: Self-pay | Admitting: *Deleted

## 2014-01-17 DIAGNOSIS — E785 Hyperlipidemia, unspecified: Secondary | ICD-10-CM

## 2014-01-17 NOTE — Telephone Encounter (Signed)
labs

## 2014-01-18 ENCOUNTER — Ambulatory Visit: Payer: Medicare Other | Admitting: Family Medicine

## 2014-01-18 NOTE — Telephone Encounter (Signed)
closed

## 2014-01-23 DIAGNOSIS — E785 Hyperlipidemia, unspecified: Secondary | ICD-10-CM | POA: Diagnosis not present

## 2014-01-24 ENCOUNTER — Ambulatory Visit (INDEPENDENT_AMBULATORY_CARE_PROVIDER_SITE_OTHER): Payer: Medicare Other | Admitting: Family Medicine

## 2014-01-24 ENCOUNTER — Encounter: Payer: Self-pay | Admitting: Family Medicine

## 2014-01-24 VITALS — BP 132/86 | HR 98 | Wt 157.0 lb

## 2014-01-24 DIAGNOSIS — K2100 Gastro-esophageal reflux disease with esophagitis, without bleeding: Secondary | ICD-10-CM

## 2014-01-24 DIAGNOSIS — F419 Anxiety disorder, unspecified: Secondary | ICD-10-CM

## 2014-01-24 DIAGNOSIS — E785 Hyperlipidemia, unspecified: Secondary | ICD-10-CM

## 2014-01-24 DIAGNOSIS — G4700 Insomnia, unspecified: Secondary | ICD-10-CM

## 2014-01-24 LAB — LIPID PANEL
CHOLESTEROL: 176 mg/dL (ref 0–200)
HDL: 49 mg/dL (ref >39)
Total CHOL/HDL Ratio: 3.6 Ratio
Triglycerides: 457 mg/dL — ABNORMAL HIGH (ref <150)

## 2014-01-24 MED ORDER — EZETIMIBE 10 MG PO TABS: 10.0000 mg | ORAL_TABLET | Freq: Every day | ORAL | Status: DC

## 2014-01-24 MED ORDER — ZOLPIDEM TARTRATE 10 MG PO TABS: 10.0000 mg | ORAL_TABLET | Freq: Every evening | ORAL | Status: DC | PRN

## 2014-01-24 MED ORDER — PANTOPRAZOLE SODIUM 40 MG PO TBEC: 40.0000 mg | DELAYED_RELEASE_TABLET | Freq: Every day | ORAL | Status: DC

## 2014-01-24 NOTE — Progress Notes (Signed)
CC: Jimmy Mayo is a 64 y.o. male is here for f/u cholesterol and f/u anxiety   Subjective: HPI:  Follow-up insomnia: He continues to take half the dose to a full dose of eszopiclone on a nightly basis however despite this he will only get 1-2 hours of sleep due to difficulty staying asleep and falling asleep. This occurs only if he has set his alarm clock for some obligation the next morning. If he has no obligations the following day and does not set his alarm clock he has no difficulty falling asleep or staying asleep with the above medication. Symptoms have been worsening over the past month. They're moderate to severe in severity. He denies anything else that's keeping him awake at night such as pain or discomfort. Denies any other mental disturbance currently  Follow-up hyperlipidemia: Continues on vascepa twice a day. He denies any right upper quadrant pain, limb claudication, chest pain, nor myalgias. Denies any chronic joint pain. It sounds like he's been eating quite a healthy diet lately and staying active going to the [facility] most days out of the week.  Follow GERD: He's been taking Nexium buying this over-the-counter once No for something that he can take his prescription benefits will cover. He tells me he stops taking this medication he gets an uneasiness in his epigastric region that resolves as soon as he takes Nexium on a daily basis. Denies any other gastrointestinal complaints or unintentional weight loss   Review Of Systems Outlined In HPI  Past Medical History  Diagnosis Date  . GERD (gastroesophageal reflux disease)   . Anxiety   . Back pain   . Anemia   . Chronic kidney disease   . Kidney stone   . Asthma     Past Surgical History  Procedure Laterality Date  . Knee arthroscopy      1986  . Vasectomy    . Spine surgery  2006    l spine fusion/rods  . Nasal sinus surgery    . Spinal fusion    . Hydrocele excision / repair      x3 or 2 on 1 and 1 on the other   . Lithotripsy    . Colonoscopy w/ biopsies and polypectomy      was to have 1 this past year but planing to see new GI in [place] realize he may need onne every 5 years   Family History  Problem Relation Age of Onset  . Cancer Mother     colon  . Cancer Father     colon, prostate  . Diabetes Father   . Diabetes Maternal Grandmother     History   Social History  . Marital Status: Divorced    Spouse Name: N/A    Number of Children: N/A  . Years of Education: N/A   Occupational History  . Not on file.   Social History Main Topics  . Smoking status: Current Every Day Smoker -- 1.50 packs/day for 35 years  . Smokeless tobacco: Not on file  . Alcohol Use: Yes  . Drug Use: No  . Sexual Activity: Not on file   Other Topics Concern  . Not on file   Social History Narrative     Objective: BP 132/86 mmHg  Pulse 98  Wt 157 lb (71.215 kg)  General: Alert and Oriented, No Acute Distress HEENT: Pupils equal, round, reactive to light. Conjunctivae clear.  Moist mucous membranes Lungs: Clear to auscultation bilaterally, no wheezing/ronchi/rales.  Comfortable work  of breathing. Good air movement. Cardiac: Regular rate and rhythm. Normal S1/S2.  No murmurs, rubs, nor gallops.   Abdomen: Flat and soft Extremities: No peripheral edema.  Strong peripheral pulses.  Mental Status: No depression or agitation. Mild anxiety Skin: Warm and dry.  Assessment & Plan: Eiden was seen today for f/u cholesterol and f/u anxiety.  Diagnoses and associated orders for this visit:  Hyperlipidemia - ezetimibe (ZETIA) 10 MG tablet; Take 1 tablet (10 mg total) by mouth daily.  Anxiety - zolpidem (AMBIEN) 10 MG tablet; Take 1 tablet (10 mg total) by mouth at bedtime as needed for sleep.  Gastroesophageal reflux disease with esophagitis - pantoprazole (PROTONIX) 40 MG tablet; Take 1 tablet (40 mg total) by mouth daily.  Insomnia    Insomnia: Uncontrolled with an element of anxiety,  stopping eszopiclone and switching to Ambien which she has tolerated in the past. Hyperlipidemia: Uncontrolled triglycerides, stopping Vascepa since it doesn't seem to be probably much benefit and is $90 a month. Begin generic Zetia with repeat lipid panel in 3 months GERD: Controlled but for financial reasons switching to Protonix which hopefully is on his formulary   Return in about 3 months (around 04/25/2014).

## 2014-02-26 ENCOUNTER — Other Ambulatory Visit: Payer: Self-pay | Admitting: Family Medicine

## 2014-03-07 ENCOUNTER — Other Ambulatory Visit: Payer: Self-pay | Admitting: Family Medicine

## 2014-04-01 ENCOUNTER — Other Ambulatory Visit: Payer: Self-pay | Admitting: Family Medicine

## 2014-04-26 ENCOUNTER — Ambulatory Visit (INDEPENDENT_AMBULATORY_CARE_PROVIDER_SITE_OTHER): Payer: Medicare Other | Admitting: Family Medicine

## 2014-04-26 ENCOUNTER — Encounter: Payer: Self-pay | Admitting: Family Medicine

## 2014-04-26 VITALS — BP 146/90 | HR 83 | Wt 157.0 lb

## 2014-04-26 DIAGNOSIS — G4700 Insomnia, unspecified: Secondary | ICD-10-CM | POA: Diagnosis not present

## 2014-04-26 DIAGNOSIS — I10 Essential (primary) hypertension: Secondary | ICD-10-CM

## 2014-04-26 DIAGNOSIS — E785 Hyperlipidemia, unspecified: Secondary | ICD-10-CM

## 2014-04-26 MED ORDER — ATORVASTATIN CALCIUM 10 MG PO TABS: 10.0000 mg | ORAL_TABLET | Freq: Every day | ORAL | Status: DC

## 2014-04-26 MED ORDER — DOXAZOSIN MESYLATE 2 MG PO TABS: 2.0000 mg | ORAL_TABLET | Freq: Every day | ORAL | Status: DC

## 2014-04-26 NOTE — Progress Notes (Signed)
CC: Jimmy Mayo is a 64 y.o. male is here for f/u insomnia and f/u cholesterol   Subjective: HPI:  Follow-up essential hypertension: Continues to take valsartan-hydrochlorothiazide on a daily basis. He's been checking his blood pressure at home and it's been about 518A systolic however never in the stage II hypertension range. He's working out on a daily basis. Trying to watch sodium intake. Denies chest pain shortness of breath orthopnea nor peripheral edema  Follow-up insomnia: Since starting Ambien he tells me he is getting more hours staying asleep and finding it easier to fall asleep in the evening. Denies any side effects.  Follow-up hyperlipidemia: He began taking Zetia after his last visit however he was worried that it was causing constipation so he stopped taking it, constipation resolved within a few days. He then restarted taking Zetia and within a day had a return of constipation. Again he stopped it constipation improved, again he started constipation returned and he has stopped it completely with no new constipation. He denies any other known side effects. He tells me that he tolerated Lipitor in the past but niacin caused itching.  He tells me he feels like one of his medications is causing him to be confused. The sensation has been here to a mild degree for the past 6-12 months. The sensation is absent after he exercises or spends time in a sauna. Nothing else seems to make it better or worse.  He denies disorientation getting lost or misplacing objects.    Review Of Systems Outlined In HPI  Past Medical History  Diagnosis Date  . GERD (gastroesophageal reflux disease)   . Anxiety   . Back pain   . Anemia   . Chronic kidney disease   . Kidney stone   . Asthma     Past Surgical History  Procedure Laterality Date  . Knee arthroscopy      1986  . Vasectomy    . Spine surgery  2006    l spine fusion/rods  . Nasal sinus surgery    . Spinal fusion    . Hydrocele  excision / repair      x3 or 2 on 1 and 1 on the other  . Lithotripsy    . Colonoscopy w/ biopsies and polypectomy      was to have 1 this past year but planing to see new GI in [place] realize he may need onne every 5 years   Family History  Problem Relation Age of Onset  . Cancer Mother     colon  . Cancer Father     colon, prostate  . Diabetes Father   . Diabetes Maternal Grandmother     History   Social History  . Marital Status: Divorced    Spouse Name: N/A  . Number of Children: N/A  . Years of Education: N/A   Occupational History  . Not on file.   Social History Main Topics  . Smoking status: Current Every Day Smoker -- 1.50 packs/day for 35 years  . Smokeless tobacco: Not on file  . Alcohol Use: Yes  . Drug Use: No  . Sexual Activity: Not on file   Other Topics Concern  . Not on file   Social History Narrative     Objective: BP 146/90 mmHg  Pulse 83  Wt 157 lb (71.215 kg)  General: Alert and Oriented, No Acute Distress HEENT: Pupils equal, round, reactive to light. Conjunctivae clear. Moist mucous membranes  Lungs: Clear to auscultation  bilaterally, no wheezing/ronchi/rales.  Comfortable work of breathing. Good air movement. Cardiac: Regular rate and rhythm. Normal S1/S2.  No murmurs, rubs, nor gallops.   Extremities: No peripheral edema.  Strong peripheral pulses.  Mental Status: No depression, anxiety, nor agitation. Skin: Warm and dry.  Assessment & Plan: Jimmy Mayo was seen today for f/u insomnia and f/u cholesterol.  Diagnoses and all orders for this visit:  Essential hypertension, benign Orders: -     doxazosin (CARDURA) 2 MG tablet; Take 1 tablet (2 mg total) by mouth daily.  Insomnia  Hyperlipidemia Orders: -     atorvastatin (LIPITOR) 10 MG tablet; Take 1 tablet (10 mg total) by mouth daily.   Essential hypertension: Uncontrolled continue valsartan-hydrochlorothiazide adding doxazosin. Insomnia: Controlled continue as needed  Ambien Hyperlipidemia: Uncontrolled, no need for lipid panel today since he's understandably not been taking Zetia regularly. Stopping Zetia will start Lipitor For his confusion whenever his medication list with him and can't find anything that would be causing this, and the rare case that his finasteride is causing this he can stop it for a week to see if it helps at all since he will be taking doxazosin to help with blood pressure and BPH     Return in about 3 months (around 07/26/2014) for cholesterol.

## 2014-04-29 ENCOUNTER — Other Ambulatory Visit: Payer: Self-pay | Admitting: Family Medicine

## 2014-04-30 ENCOUNTER — Other Ambulatory Visit: Payer: Self-pay | Admitting: Family Medicine

## 2014-05-16 ENCOUNTER — Other Ambulatory Visit: Payer: Self-pay | Admitting: Family Medicine

## 2014-06-03 ENCOUNTER — Other Ambulatory Visit: Payer: Self-pay | Admitting: Family Medicine

## 2014-06-10 ENCOUNTER — Other Ambulatory Visit: Payer: Self-pay | Admitting: Family Medicine

## 2014-06-11 DIAGNOSIS — H25811 Combined forms of age-related cataract, right eye: Secondary | ICD-10-CM | POA: Diagnosis not present

## 2014-06-11 DIAGNOSIS — H40013 Open angle with borderline findings, low risk, bilateral: Secondary | ICD-10-CM | POA: Diagnosis not present

## 2014-06-11 DIAGNOSIS — H527 Unspecified disorder of refraction: Secondary | ICD-10-CM | POA: Diagnosis not present

## 2014-06-12 DIAGNOSIS — M47816 Spondylosis without myelopathy or radiculopathy, lumbar region: Secondary | ICD-10-CM | POA: Diagnosis not present

## 2014-06-27 ENCOUNTER — Other Ambulatory Visit: Payer: Self-pay | Admitting: Family Medicine

## 2014-07-04 ENCOUNTER — Other Ambulatory Visit: Payer: Self-pay | Admitting: Family Medicine

## 2014-07-16 ENCOUNTER — Other Ambulatory Visit: Payer: Self-pay | Admitting: Sports Medicine

## 2014-07-16 ENCOUNTER — Other Ambulatory Visit: Payer: Self-pay | Admitting: Family Medicine

## 2014-07-26 ENCOUNTER — Ambulatory Visit: Payer: Medicare Other | Admitting: Family Medicine

## 2014-08-05 ENCOUNTER — Other Ambulatory Visit: Payer: Self-pay | Admitting: Family Medicine

## 2014-08-07 ENCOUNTER — Other Ambulatory Visit: Payer: Self-pay | Admitting: Family Medicine

## 2014-08-12 ENCOUNTER — Ambulatory Visit (INDEPENDENT_AMBULATORY_CARE_PROVIDER_SITE_OTHER): Payer: Medicare Other | Admitting: Family Medicine

## 2014-08-12 ENCOUNTER — Encounter: Payer: Self-pay | Admitting: Family Medicine

## 2014-08-12 VITALS — BP 139/87 | HR 88 | Wt 157.0 lb

## 2014-08-12 DIAGNOSIS — I10 Essential (primary) hypertension: Secondary | ICD-10-CM | POA: Diagnosis not present

## 2014-08-12 DIAGNOSIS — M2550 Pain in unspecified joint: Secondary | ICD-10-CM | POA: Diagnosis not present

## 2014-08-12 DIAGNOSIS — E785 Hyperlipidemia, unspecified: Secondary | ICD-10-CM | POA: Diagnosis not present

## 2014-08-13 LAB — ANA: Anti Nuclear Antibody(ANA): NEGATIVE

## 2014-08-13 LAB — LIPID PANEL
Cholesterol: 149 mg/dL (ref 125–200)
HDL: 48 mg/dL (ref >=40)
LDL Cholesterol: 69 mg/dL (ref <130)
Total CHOL/HDL Ratio: 3.1 Ratio (ref <=5.0)
Triglycerides: 160 mg/dL — ABNORMAL HIGH (ref <150)
VLDL: 32 mg/dL — ABNORMAL HIGH (ref <30)

## 2014-08-13 LAB — C-REACTIVE PROTEIN: CRP: <0.5 mg/dL (ref <0.60)

## 2014-08-13 LAB — SEDIMENTATION RATE: Sed Rate: 4 mm/hr (ref 0–20)

## 2014-08-14 NOTE — Progress Notes (Signed)
CC: Jimmy Mayo is a 64 y.o. male is here for Hyperlipidemia   Subjective: HPI:   Follow-up hyperlipidemia: Has been on Lipitor now for approximately 4 months. He had some joint pain prior to starting the second it has not gotten better or worse since starting Lipitor. He denies any other known side effects. He denies myalgias or right upper quadrant pain.  Follow-up essential hypertension: Continues to take Diovan HCT on a daily basis. No outside blood pressures report. No chest pain shortness of breath orthopnea nor peripheral edema.  His major complaint today is chronic joint pain localized in the shoulders and in the knees. Symptoms are mild to moderate in severity and worse first thing in the morning. Symptoms improve the more active he is on a daily basis. Symptoms have slightly improved ever since reducing gluten in his diet, simple sugars and focusing more on fish and poultry for calories. He is worried that there may be some chronic inflammatory disease that's causing his joint pain. Denies swelling redness or warmth of any of his joints. Denies pain in the hands or mechanical symptoms with the knees.   Review Of Systems Outlined In HPI  Past Medical History  Diagnosis Date  . GERD (gastroesophageal reflux disease)   . Anxiety   . Back pain   . Anemia   . Chronic kidney disease   . Kidney stone   . Asthma     Past Surgical History  Procedure Laterality Date  . Knee arthroscopy      1986  . Vasectomy    . Spine surgery  2006    l spine fusion/rods  . Nasal sinus surgery    . Spinal fusion    . Hydrocele excision / repair      x3 or 2 on 1 and 1 on the other  . Lithotripsy    . Colonoscopy w/ biopsies and polypectomy      was to have 1 this past year but planing to see new GI in [place] realize he may need onne every 5 years   Family History  Problem Relation Age of Onset  . Cancer Mother     colon  . Cancer Father     colon, prostate  . Diabetes Father   .  Diabetes Maternal Grandmother     History   Social History  . Marital Status: Divorced    Spouse Name: N/A  . Number of Children: N/A  . Years of Education: N/A   Occupational History  . Not on file.   Social History Main Topics  . Smoking status: Current Every Day Smoker -- 1.50 packs/day for 35 years  . Smokeless tobacco: Not on file  . Alcohol Use: Yes  . Drug Use: No  . Sexual Activity: Not on file   Other Topics Concern  . Not on file   Social History Narrative     Objective: BP 139/87 mmHg  Pulse 88  Wt 157 lb (71.215 kg)  General: Alert and Oriented, No Acute Distress HEENT: Pupils equal, round, reactive to light. Conjunctivae clear.  Moist mucous membranes Lungs: Clear to auscultation bilaterally, no wheezing/ronchi/rales.  Comfortable work of breathing. Good air movement. Cardiac: Regular rate and rhythm. Normal S1/S2.  No murmurs, rubs, nor gallops.   Extremities: No peripheral edema.  Strong peripheral pulses. Full range of motion and strength in all 4 extremities without any swelling redness or warmth to the knees or shoulders. Mental Status: No depression, anxiety, nor agitation. Skin:  Warm and dry.  Assessment & Plan: Lawson was seen today for hyperlipidemia.  Diagnoses and all orders for this visit:  Hyperlipidemia Orders: -     Lipid panel  Multiple joint pain Orders: -     Antinuclear Antib (ANA) -     C-reactive protein -     Sed Rate (ESR)  Essential hypertension, benign   Hyperlipidemia: Due for lipid panel, continue Lipitor pending results Multiple joint pain: Ruling out autoimmune process with ANA, checking CRP and ESR Essential hypertension: Controlled continue Diovan/HCT  Return in about 3 months (around 11/12/2014).

## 2014-08-15 ENCOUNTER — Other Ambulatory Visit: Payer: Self-pay | Admitting: Family Medicine

## 2014-09-11 ENCOUNTER — Other Ambulatory Visit: Payer: Self-pay | Admitting: Sports Medicine

## 2014-09-11 ENCOUNTER — Other Ambulatory Visit: Payer: Self-pay | Admitting: Family Medicine

## 2014-10-16 ENCOUNTER — Other Ambulatory Visit: Payer: Self-pay

## 2014-10-16 ENCOUNTER — Other Ambulatory Visit: Payer: Self-pay | Admitting: Sports Medicine

## 2014-10-16 MED ORDER — ALPRAZOLAM 0.5 MG PO TABS: ORAL_TABLET | ORAL | Status: DC

## 2014-10-16 NOTE — Telephone Encounter (Signed)
PATIENT REQUEST REFILL FOR XANAX . PATIENT WAS ADVISED THAT APPOINTMENT IS NEEDED FOR FURTHER REFILL.[name] Jimmy Mayo,CMA

## 2014-11-12 ENCOUNTER — Other Ambulatory Visit: Payer: Self-pay | Admitting: Family Medicine

## 2014-11-12 NOTE — Telephone Encounter (Signed)
Pt would like a refill on Ambien

## 2014-11-25 ENCOUNTER — Other Ambulatory Visit: Payer: Self-pay | Admitting: Family Medicine

## 2014-12-09 ENCOUNTER — Telehealth: Payer: Self-pay

## 2014-12-09 DIAGNOSIS — R1013 Epigastric pain: Secondary | ICD-10-CM | POA: Diagnosis not present

## 2014-12-09 DIAGNOSIS — Z0000 Encounter for general adult medical examination without abnormal findings: Secondary | ICD-10-CM | POA: Diagnosis not present

## 2014-12-09 DIAGNOSIS — E785 Hyperlipidemia, unspecified: Secondary | ICD-10-CM | POA: Diagnosis not present

## 2014-12-09 NOTE — Telephone Encounter (Signed)
Pt has appointment on Wed and would like to get labs drawn on Tue. afternoon.

## 2014-12-09 NOTE — Telephone Encounter (Signed)
Pt.notified

## 2014-12-09 NOTE — Telephone Encounter (Signed)
Lab orders in in box

## 2014-12-10 LAB — CBC
HCT: 46 % (ref 39.0–52.0)
HEMOGLOBIN: 15.6 g/dL (ref 13.0–17.0)
MCH: 31.9 pg (ref 26.0–34.0)
MCHC: 33.9 g/dL (ref 30.0–36.0)
MCV: 94.1 fL (ref 78.0–100.0)
MPV: 12.7 fL — ABNORMAL HIGH (ref 8.6–12.4)
Platelets: 234 10*3/uL (ref 150–400)
RBC: 4.89 MIL/uL (ref 4.22–5.81)
RDW: 14.1 % (ref 11.5–15.5)
WBC: 6.2 10*3/uL (ref 4.0–10.5)

## 2014-12-10 LAB — LIPID PANEL
Cholesterol: 159 mg/dL (ref 125–200)
HDL: 58 mg/dL (ref >=40)
LDL CALC: 65 mg/dL (ref <130)
Total CHOL/HDL Ratio: 2.7 Ratio (ref <=5.0)
Triglycerides: 182 mg/dL — ABNORMAL HIGH (ref <150)
VLDL: 36 mg/dL — ABNORMAL HIGH (ref <30)

## 2014-12-10 LAB — COMPLETE METABOLIC PANEL WITH GFR
ALBUMIN: 4.3 g/dL (ref 3.6–5.1)
ALK PHOS: 69 U/L (ref 40–115)
ALT: 22 U/L (ref 9–46)
AST: 22 U/L (ref 10–35)
BILIRUBIN TOTAL: 0.7 mg/dL (ref 0.2–1.2)
BUN: 18 mg/dL (ref 7–25)
CO2: 28 mmol/L (ref 20–31)
CREATININE: 0.89 mg/dL (ref 0.70–1.25)
Calcium: 9.9 mg/dL (ref 8.6–10.3)
Chloride: 98 mmol/L (ref 98–110)
GFR, Est African American: >89 mL/min (ref >=60)
GFR, Est Non African American: >89 mL/min (ref >=60)
GLUCOSE: 95 mg/dL (ref 65–99)
Potassium: 4.2 mmol/L (ref 3.5–5.3)
SODIUM: 138 mmol/L (ref 135–146)
TOTAL PROTEIN: 6.7 g/dL (ref 6.1–8.1)

## 2014-12-11 ENCOUNTER — Ambulatory Visit (INDEPENDENT_AMBULATORY_CARE_PROVIDER_SITE_OTHER): Payer: Medicare Other | Admitting: Family Medicine

## 2014-12-11 ENCOUNTER — Encounter: Payer: Self-pay | Admitting: Family Medicine

## 2014-12-11 VITALS — BP 142/86 | HR 95 | Temp 97.9°F | Wt 151.0 lb

## 2014-12-11 DIAGNOSIS — R29898 Other symptoms and signs involving the musculoskeletal system: Secondary | ICD-10-CM | POA: Diagnosis not present

## 2014-12-11 DIAGNOSIS — B070 Plantar wart: Secondary | ICD-10-CM | POA: Diagnosis not present

## 2014-12-11 DIAGNOSIS — Z23 Encounter for immunization: Secondary | ICD-10-CM | POA: Diagnosis not present

## 2014-12-11 DIAGNOSIS — Z0000 Encounter for general adult medical examination without abnormal findings: Secondary | ICD-10-CM

## 2014-12-11 LAB — PSA: PSA: 0.76 ng/mL (ref <=4.00)

## 2014-12-11 MED ORDER — ZOLPIDEM TARTRATE 10 MG PO TABS
10.0000 mg | ORAL_TABLET | Freq: Every evening | ORAL | Status: DC | PRN
Start: 2014-12-11 — End: 2015-02-07

## 2014-12-11 MED ORDER — ALPRAZOLAM 0.5 MG PO TABS: 0.5000 mg | ORAL_TABLET | Freq: Three times a day (TID) | ORAL | Status: DC | PRN

## 2014-12-11 MED ORDER — MONTELUKAST SODIUM 10 MG PO TABS: 10.0000 mg | ORAL_TABLET | Freq: Every day | ORAL | Status: DC

## 2014-12-11 MED ORDER — FINASTERIDE 5 MG PO TABS: 5.0000 mg | ORAL_TABLET | Freq: Every day | ORAL | Status: DC

## 2014-12-11 MED ORDER — PANTOPRAZOLE SODIUM 40 MG PO TBEC: DELAYED_RELEASE_TABLET | ORAL | Status: DC

## 2014-12-11 NOTE — Progress Notes (Signed)
CC: Jimmy Mayo is a 64 y.o. male is here for Annual Exam   Subjective: HPI:  Colonoscopy:Repeat needed 05/2015 Prostate: Discussed screening risks/beneifts with patient today, normal PSA yesterday  Influenza Vaccine: will receive today Pneumovax: UTD Td/Tdap: UTD until 2019 Zoster: UTD  Requesting complete physical exam with complaints of eye redness worse when exposed to pollen or dust. No benefit from Claritin or Benadryl.  Complains of subjective upper and lower extremity muscle mass. Denies any new weakness.   Review of Systems - General ROS: negative for - chills, fever, night sweats, weight gain or weight loss Ophthalmic ROS: negative for - decreased vision Psychological ROS: negative for - anxiety or depression ENT ROS: negative for - hearing change, nasal congestion, tinnitus Hematological and Lymphatic ROS: negative for - bleeding problems, bruising or swollen lymph nodes Breast ROS: negative Respiratory ROS: no cough, shortness of breath, or wheezing Cardiovascular ROS: no chest pain or dyspnea on exertion Gastrointestinal ROS: no abdominal pain, change in bowel habits, or black or bloody stools Genito-Urinary ROS: negative for - genital discharge, genital ulcers, incontinence or abnormal bleeding from genitals Musculoskeletal ROS: negative for - joint pain or muscle pain Neurological ROS: negative for - headaches or memory loss Dermatological ROS: negative for lumps, mole changes, rash and skin lesion changes  Past Medical History  Diagnosis Date  . GERD (gastroesophageal reflux disease)   . Anxiety   . Back pain   . Anemia   . Chronic kidney disease   . Kidney stone   . Asthma     Past Surgical History  Procedure Laterality Date  . Knee arthroscopy      1986  . Vasectomy    . Spine surgery  2006    l spine fusion/rods  . Nasal sinus surgery    . Spinal fusion    . Hydrocele excision / repair      x3 or 2 on 1 and 1 on the other  . Lithotripsy    .  Colonoscopy w/ biopsies and polypectomy      was to have 1 this past year but planing to see new GI in [place] realize he may need onne every 5 years   Family History  Problem Relation Age of Onset  . Cancer Mother     colon  . Cancer Father     colon, prostate  . Diabetes Father   . Diabetes Maternal Grandmother     Social History   Social History  . Marital Status: Divorced    Spouse Name: N/A  . Number of Children: N/A  . Years of Education: N/A   Occupational History  . Not on file.   Social History Main Topics  . Smoking status: Current Every Day Smoker -- 1.50 packs/day for 35 years  . Smokeless tobacco: Not on file  . Alcohol Use: Yes  . Drug Use: No  . Sexual Activity: Not on file   Other Topics Concern  . Not on file   Social History Narrative     Objective: BP 142/86 mmHg  Pulse 95  Temp(Src) 97.9 F (36.6 C) (Oral)  Wt 151 lb (68.493 kg)  General: No Acute Distress HEENT: Atraumatic, normocephalic, conjunctivae normal without scleral icterus.  No nasal discharge, hearing grossly intact, TMs with good landmarks bilaterally with no middle ear abnormalities, posterior pharynx clear without oral lesions. Neck: Supple, trachea midline, no cervical nor supraclavicular adenopathy. Pulmonary: Clear to auscultation bilaterally without wheezing, rhonchi, nor rales. Cardiac: Regular rate and  rhythm.  No murmurs, rubs, nor gallops. No peripheral edema.  2+ peripheral pulses bilaterally. Abdomen: Bowel sounds normal.  No masses.  Non-tender without rebound.  Negative Murphy's sign. MSK: Grossly intact, no signs of weakness.  Full strength throughout upper and lower extremities.  Full ROM in upper and lower extremities.  No midline spinal tenderness. Neuro: Gait unremarkable, CN II-XII grossly intact.  C5-C6 Reflex 2/4 Bilaterally, L4 Reflex 2/4 Bilaterally.  Cerebellar function intact. Skin: No rashes. Psych: Alert and oriented to person/place/time.  Thought  process normal. No anxiety/depression.  Assessment & Plan: Jimmy Mayo was seen today for annual exam.  Diagnoses and all orders for this visit:  Annual physical exam  Muscle function loss ([name]) -     Testosterone  Plantar wart -     Ambulatory referral to Podiatry  Encounter for immunization  Other orders -     pantoprazole (PROTONIX) 40 MG tablet; Take 1 tablet (40 mg total) by mouth daily. -     finasteride (PROSCAR) 5 MG tablet; Take 1 tablet (5 mg total) by mouth daily. -     zolpidem (AMBIEN) 10 MG tablet; Take 1 tablet (10 mg total) by mouth at bedtime as needed. for sleep -     ALPRAZolam (XANAX) 0.5 MG tablet; Take 1 tablet (0.5 mg total) by mouth 3 (three) times daily as needed for anxiety. -     montelukast (SINGULAIR) 10 MG tablet; Take 1 tablet (10 mg total) by mouth at bedtime. -     Flu Vaccine QUAD 36+ mos IM   Healthy lifestyle interventions including but not limited to regular exercise, a healthy low fat diet, moderation of salt intake, the dangers of tobacco/alcohol/recreational drug use, nutrition supplementation, and accident avoidance were discussed with the patient and a handout was provided for future reference.  Rule out hypogonadism causing Subjective muscle mass loss   Return in about 3 months (around 03/11/2015).

## 2014-12-18 DIAGNOSIS — R29898 Other symptoms and signs involving the musculoskeletal system: Secondary | ICD-10-CM | POA: Diagnosis not present

## 2014-12-19 LAB — TESTOSTERONE: TESTOSTERONE: 336 ng/dL (ref 300–890)

## 2014-12-23 DIAGNOSIS — M47816 Spondylosis without myelopathy or radiculopathy, lumbar region: Secondary | ICD-10-CM | POA: Diagnosis not present

## 2014-12-23 DIAGNOSIS — S161XXA Strain of muscle, fascia and tendon at neck level, initial encounter: Secondary | ICD-10-CM | POA: Diagnosis not present

## 2015-01-07 ENCOUNTER — Other Ambulatory Visit: Payer: Self-pay | Admitting: Family Medicine

## 2015-01-08 ENCOUNTER — Other Ambulatory Visit: Payer: Self-pay | Admitting: Family Medicine

## 2015-01-08 ENCOUNTER — Other Ambulatory Visit: Payer: Self-pay

## 2015-01-08 MED ORDER — ALPRAZOLAM 0.5 MG PO TABS: 0.5000 mg | ORAL_TABLET | Freq: Three times a day (TID) | ORAL | Status: DC | PRN

## 2015-01-16 ENCOUNTER — Other Ambulatory Visit: Payer: Self-pay | Admitting: Family Medicine

## 2015-02-07 ENCOUNTER — Other Ambulatory Visit: Payer: Self-pay | Admitting: Family Medicine

## 2015-03-07 ENCOUNTER — Other Ambulatory Visit: Payer: Self-pay | Admitting: Sports Medicine

## 2015-03-07 ENCOUNTER — Other Ambulatory Visit: Payer: Self-pay | Admitting: Family Medicine

## 2015-04-07 ENCOUNTER — Other Ambulatory Visit: Payer: Self-pay | Admitting: Sports Medicine

## 2015-04-07 ENCOUNTER — Other Ambulatory Visit: Payer: Self-pay | Admitting: Family Medicine

## 2015-04-10 ENCOUNTER — Other Ambulatory Visit: Payer: Self-pay | Admitting: Family Medicine

## 2015-04-16 ENCOUNTER — Other Ambulatory Visit: Payer: Self-pay | Admitting: Family Medicine

## 2015-05-03 ENCOUNTER — Other Ambulatory Visit: Payer: Self-pay | Admitting: Family Medicine

## 2015-05-09 ENCOUNTER — Ambulatory Visit (INDEPENDENT_AMBULATORY_CARE_PROVIDER_SITE_OTHER): Payer: Medicare Other | Admitting: Family Medicine

## 2015-05-09 ENCOUNTER — Encounter: Payer: Self-pay | Admitting: Family Medicine

## 2015-05-09 VITALS — BP 138/88 | HR 92 | Wt 159.0 lb

## 2015-05-09 DIAGNOSIS — M542 Cervicalgia: Secondary | ICD-10-CM | POA: Diagnosis not present

## 2015-05-09 DIAGNOSIS — I10 Essential (primary) hypertension: Secondary | ICD-10-CM

## 2015-05-09 DIAGNOSIS — F419 Anxiety disorder, unspecified: Secondary | ICD-10-CM

## 2015-05-09 MED ORDER — MONTELUKAST SODIUM 10 MG PO TABS
ORAL_TABLET | ORAL | Status: DC
Start: 2015-05-09 — End: 2015-09-01

## 2015-05-09 MED ORDER — MAGNESIUM GLUCONATE 500 MG PO TABS: 500.0000 mg | ORAL_TABLET | Freq: Two times a day (BID) | ORAL | Status: DC

## 2015-05-09 MED ORDER — VALSARTAN-HYDROCHLOROTHIAZIDE 160-12.5 MG PO TABS: 1.0000 | ORAL_TABLET | Freq: Every day | ORAL | Status: DC

## 2015-05-09 NOTE — Progress Notes (Signed)
CC: Jimmy Mayo is a 65 y.o. male is here for Fatigue and tightness in shoulder and neck   Subjective: HPI:  Follow-up essential hypertension: He is taking Diovan HCT on a daily basis with no known side effects. He checks his blood pressure almost every day with glucose for [facility] it's always in the normotensive range. Denies chest pain shortness of breath orthopnea nor peripheral edema.  Continues to have anxiety has been worsened by his mother's recent diagnosis of dementia. Symptoms are worsening his around his mother however he still loves her and has to be the primary caregiver for her. Symptoms are worse at night when trying to go to sleep however he takes Ambien he's having no difficulty with falling asleep or staying asleep.  Complains of neck pain described as a tightness in the back of the neck. Slowly getting better with visits to his chiropractor. Symptoms are only mild in severity and worse at the end of the day. Improved with ice packs. Worsened with heating pads. It's localized in the back of the neck and nonradiating. Denies any upper extremity pain or weakness. Pain is further described as stiffness.   Review Of Systems Outlined In HPI  Past Medical History  Diagnosis Date  . GERD (gastroesophageal reflux disease)   . Anxiety   . Back pain   . Anemia   . Chronic kidney disease   . Kidney stone   . Asthma     Past Surgical History  Procedure Laterality Date  . Knee arthroscopy      1986  . Vasectomy    . Spine surgery  2006    l spine fusion/rods  . Nasal sinus surgery    . Spinal fusion    . Hydrocele excision / repair      x3 or 2 on 1 and 1 on the other  . Lithotripsy    . Colonoscopy w/ biopsies and polypectomy      was to have 1 this past year but planing to see new GI in [place] realize he may need onne every 5 years   Family History  Problem Relation Age of Onset  . Cancer Mother     colon  . Cancer Father     colon, prostate  . Diabetes Father    . Diabetes Maternal Grandmother     Social History   Social History  . Marital Status: Divorced    Spouse Name: N/A  . Number of Children: N/A  . Years of Education: N/A   Occupational History  . Not on file.   Social History Main Topics  . Smoking status: Current Every Day Smoker -- 1.50 packs/day for 35 years  . Smokeless tobacco: Not on file  . Alcohol Use: Yes  . Drug Use: No  . Sexual Activity: Not on file   Other Topics Concern  . Not on file   Social History Narrative     Objective: BP 138/88 mmHg  Pulse 92  Wt 159 lb (72.122 kg)  General: Alert and Oriented, No Acute Distress HEENT: Pupils equal, round, reactive to light. Conjunctivae clear.  Moist mucous membranes. Lungs: Clear to auscultation bilaterally, no wheezing/ronchi/rales.  Comfortable work of breathing. Good air movement. Cardiac: Regular rate and rhythm. Normal S1/S2.  No murmurs, rubs, nor gallops.   Neck: Full range of motion and strength in the neck. No midline spine is process tenderness. Extremities: No peripheral edema.  Strong peripheral pulses.  Mental Status: No depression, anxiety, nor agitation.  Skin: Warm and dry.  Assessment & Plan: Jimmy Mayo was seen today for fatigue and tightness in shoulder and neck.  Diagnoses and all orders for this visit:  Essential hypertension, benign  Anxiety  Neck pain  Other orders -     valsartan-hydrochlorothiazide (DIOVAN-HCT) 160-12.5 MG tablet; Take 1 tablet by mouth daily. -     montelukast (SINGULAIR) 10 MG tablet; Take 1 tablet (10 mg total) by mouth at bedtime. -     magnesium gluconate (MAGONATE) 500 MG tablet; Take 1 tablet (500 mg total) by mouth 2 (two) times daily.   Essential hypertension: Controlled continue daily Diovan HCT Anxiety: Controlled with as needed use of Xanax and nightly Ambien Neck pain: Discussed range of motion exercises to help with mobility and to relieve the tightness discomfort that he is experiencing.  Return in  about 3 months (around 08/08/2015) for BP.

## 2015-05-23 ENCOUNTER — Encounter: Payer: Self-pay | Admitting: Family Medicine

## 2015-05-23 ENCOUNTER — Ambulatory Visit (INDEPENDENT_AMBULATORY_CARE_PROVIDER_SITE_OTHER): Payer: Medicare Other | Admitting: Family Medicine

## 2015-05-23 VITALS — BP 125/87 | HR 92 | Wt 160.0 lb

## 2015-05-23 DIAGNOSIS — J410 Simple chronic bronchitis: Secondary | ICD-10-CM

## 2015-05-23 DIAGNOSIS — J42 Unspecified chronic bronchitis: Secondary | ICD-10-CM | POA: Insufficient documentation

## 2015-05-23 MED ORDER — AZITHROMYCIN 250 MG PO TABS: ORAL_TABLET | ORAL | Status: AC

## 2015-05-23 NOTE — Progress Notes (Signed)
CC: Jimmy Mayo is a 65 y.o. male is here for Sinusitis   Subjective: HPI:  Facial pressure in the cheeks, postnasal drip, fatigue, productive cough and shortness of breath has been present for the past week worsening on a daily basis. Currently moderate in severity. No benefit from Benadryl no other interventions as of yet. He denies fevers, chills or difficulty sleeping. Symptoms are worse while awake and standing.   Review Of Systems Outlined In HPI  Past Medical History  Diagnosis Date  . GERD (gastroesophageal reflux disease)   . Anxiety   . Back pain   . Anemia   . Chronic kidney disease   . Kidney stone   . Asthma     Past Surgical History  Procedure Laterality Date  . Knee arthroscopy      1986  . Vasectomy    . Spine surgery  2006    l spine fusion/rods  . Nasal sinus surgery    . Spinal fusion    . Hydrocele excision / repair      x3 or 2 on 1 and 1 on the other  . Lithotripsy    . Colonoscopy w/ biopsies and polypectomy      was to have 1 this past year but planing to see new GI in [place] realize he may need onne every 5 years   Family History  Problem Relation Age of Onset  . Cancer Mother     colon  . Cancer Father     colon, prostate  . Diabetes Father   . Diabetes Maternal Grandmother     Social History   Social History  . Marital Status: Divorced    Spouse Name: N/A  . Number of Children: N/A  . Years of Education: N/A   Occupational History  . Not on file.   Social History Main Topics  . Smoking status: Current Every Day Smoker -- 1.50 packs/day for 35 years  . Smokeless tobacco: Not on file  . Alcohol Use: Yes  . Drug Use: No  . Sexual Activity: Not on file   Other Topics Concern  . Not on file   Social History Narrative     Objective: BP 125/87 mmHg  Pulse 92  Wt 160 lb (72.576 kg)  SpO2 95%  General: Alert and Oriented, No Acute Distress HEENT: Pupils equal, round, reactive to light. Conjunctivae clear.  External  ears unremarkable, canals clear with intact TMs with appropriate landmarks.  Middle ear appears open without effusion. Pink inferior turbinates.  Moist mucous membranes, pharynx without inflammation nor lesions Other than postnasal drip.  Neck supple without palpable lymphadenopathy nor abnormal masses. Lungs: Clear to auscultation bilaterally, no wheezing/ronchi/rales.  Comfortable work of breathing. Good air movement. Extremities: No peripheral edema.  Strong peripheral pulses.  Mental Status: No depression, anxiety, nor agitation. Skin: Warm and dry.  Assessment & Plan: Jimmy Mayo was seen today for sinusitis.  Diagnoses and all orders for this visit:  Simple chronic bronchitis ([place]) -     azithromycin (ZITHROMAX) 250 MG tablet; Take two tabs at once on day 1, then one tab daily on days 2-5.   Bacterial sinusitis along with mild exacerbation of chronic bronchitis, start azithromycin call if no better by Monday.  No Follow-up on file.

## 2015-05-29 ENCOUNTER — Other Ambulatory Visit: Payer: Self-pay | Admitting: Family Medicine

## 2015-05-29 ENCOUNTER — Other Ambulatory Visit: Payer: Self-pay | Admitting: Sports Medicine

## 2015-06-24 DIAGNOSIS — M47812 Spondylosis without myelopathy or radiculopathy, cervical region: Secondary | ICD-10-CM | POA: Diagnosis not present

## 2015-06-24 DIAGNOSIS — M47816 Spondylosis without myelopathy or radiculopathy, lumbar region: Secondary | ICD-10-CM | POA: Diagnosis not present

## 2015-07-01 DIAGNOSIS — H02831 Dermatochalasis of right upper eyelid: Secondary | ICD-10-CM | POA: Diagnosis not present

## 2015-07-01 DIAGNOSIS — H25811 Combined forms of age-related cataract, right eye: Secondary | ICD-10-CM | POA: Diagnosis not present

## 2015-07-01 DIAGNOSIS — H35373 Puckering of macula, bilateral: Secondary | ICD-10-CM | POA: Diagnosis not present

## 2015-07-01 DIAGNOSIS — H40003 Preglaucoma, unspecified, bilateral: Secondary | ICD-10-CM | POA: Diagnosis not present

## 2015-07-02 ENCOUNTER — Encounter: Payer: Self-pay | Admitting: Family Medicine

## 2015-07-02 ENCOUNTER — Ambulatory Visit (INDEPENDENT_AMBULATORY_CARE_PROVIDER_SITE_OTHER): Payer: Medicare Other | Admitting: Family Medicine

## 2015-07-02 VITALS — BP 147/88 | HR 93 | Wt 158.0 lb

## 2015-07-02 DIAGNOSIS — B070 Plantar wart: Secondary | ICD-10-CM | POA: Diagnosis not present

## 2015-07-02 DIAGNOSIS — N400 Enlarged prostate without lower urinary tract symptoms: Secondary | ICD-10-CM

## 2015-07-02 DIAGNOSIS — Z8739 Personal history of other diseases of the musculoskeletal system and connective tissue: Secondary | ICD-10-CM

## 2015-07-02 DIAGNOSIS — Z8639 Personal history of other endocrine, nutritional and metabolic disease: Secondary | ICD-10-CM | POA: Diagnosis not present

## 2015-07-02 DIAGNOSIS — I10 Essential (primary) hypertension: Secondary | ICD-10-CM

## 2015-07-02 MED ORDER — FINASTERIDE 5 MG PO TABS: 5.0000 mg | ORAL_TABLET | Freq: Every day | ORAL | Status: DC

## 2015-07-02 MED ORDER — PANTOPRAZOLE SODIUM 40 MG PO TBEC: DELAYED_RELEASE_TABLET | ORAL | Status: DC

## 2015-07-02 MED ORDER — ALLOPURINOL 300 MG PO TABS: 300.0000 mg | ORAL_TABLET | Freq: Two times a day (BID) | ORAL | Status: DC

## 2015-07-02 MED ORDER — ZOLPIDEM TARTRATE 10 MG PO TABS: ORAL_TABLET | ORAL | Status: DC

## 2015-07-02 NOTE — Progress Notes (Signed)
CC: Jimmy Mayo is a 65 y.o. male is here for Medication Refill   Subjective: HPI:  Follow-up essential hypertension: He is taking Diovan HCT on a daily basis with outside blood pressures being below 140/90 at the [facility]. No chest pain shortness of breath orthopnea nor peripheral edema.  Follow-up: Requesting a refill on allopurinol. He is taking this twice a day. He tells me it's been over a year since his last gout flare. He denies any joint pain or swelling or recent redness today.  Follow-up BPH: Since started on finasteride years ago he's not had any nocturnal awakening to urinate. He denies incomplete voiding, weak stream or any other urinary tract complaints today  He is yet to make an appointment with podiatry. He is interested in possibly seeing the podiatrist that comes downstairs once a week. He still has plantar warts that are painful   Review Of Systems Outlined In HPI  Past Medical History  Diagnosis Date  . GERD (gastroesophageal reflux disease)   . Anxiety   . Back pain   . Anemia   . Chronic kidney disease   . Kidney stone   . Asthma     Past Surgical History  Procedure Laterality Date  . Knee arthroscopy      1986  . Vasectomy    . Spine surgery  2006    l spine fusion/rods  . Nasal sinus surgery    . Spinal fusion    . Hydrocele excision / repair      x3 or 2 on 1 and 1 on the other  . Lithotripsy    . Colonoscopy w/ biopsies and polypectomy      was to have 1 this past year but planing to see new GI in [place] realize he may need onne every 5 years   Family History  Problem Relation Age of Onset  . Cancer Mother     colon  . Cancer Father     colon, prostate  . Diabetes Father   . Diabetes Maternal Grandmother     Social History   Social History  . Marital Status: Divorced    Spouse Name: N/A  . Number of Children: N/A  . Years of Education: N/A   Occupational History  . Not on file.   Social History Main Topics  . Smoking status:  Current Every Day Smoker -- 1.50 packs/day for 35 years  . Smokeless tobacco: Not on file  . Alcohol Use: Yes  . Drug Use: No  . Sexual Activity: Not on file   Other Topics Concern  . Not on file   Social History Narrative     Objective: BP 147/88 mmHg  Pulse 93  Wt 158 lb (71.668 kg)  General: Alert and Oriented, No Acute Distress HEENT: Pupils equal, round, reactive to light. Conjunctivae clear. Moist mucous membranes Lungs: Clear to auscultation bilaterally, no wheezing/ronchi/rales.  Comfortable work of breathing. Good air movement. Cardiac: Regular rate and rhythm. Normal S1/S2.  No murmurs, rubs, nor gallops.   Extremities: No peripheral edema.  Strong peripheral pulses.  Mental Status: No depression, anxiety, nor agitation. Skin: Warm and dry.  Assessment & Plan: Jimmy Mayo was seen today for medication refill.  Diagnoses and all orders for this visit:  Essential hypertension, benign  History of gout  BPH (benign prostatic hyperplasia)  Plantar wart -     Ambulatory referral to Podiatry  Other orders -     finasteride (PROSCAR) 5 MG tablet; Take 1 tablet (  5 mg total) by mouth daily. -     allopurinol (ZYLOPRIM) 300 MG tablet; Take 1 tablet (300 mg total) by mouth 2 (two) times daily. -     Discontinue: zolpidem (AMBIEN) 10 MG tablet; TAKE 1 TABLET DAILY AT BEDTIME AS NEEDED FOR SLEEP -     pantoprazole (PROTONIX) 40 MG tablet; Take 1 tablet (40 mg total) by mouth daily. -     zolpidem (AMBIEN) 10 MG tablet; TAKE 1 TABLET DAILY AT BEDTIME AS NEEDED FOR SLEEP   Essential hypertension: Controlled outside of her office will consider this whitecoat hypertension as long as he can continue to check blood pressures regularly at the [facility]. Continue Diovan HCT Plantar wart: Referral to podiatry downstairs BPH: Controlled with finasteride Gout: Controlled with allopurinol.   Return in about 3 months (around 10/02/2015) for BP.

## 2015-07-09 ENCOUNTER — Other Ambulatory Visit: Payer: Self-pay | Admitting: Family Medicine

## 2015-07-14 ENCOUNTER — Other Ambulatory Visit: Payer: Self-pay | Admitting: Sports Medicine

## 2015-07-14 ENCOUNTER — Ambulatory Visit (INDEPENDENT_AMBULATORY_CARE_PROVIDER_SITE_OTHER): Payer: Medicare Other | Admitting: Podiatry

## 2015-07-14 ENCOUNTER — Encounter: Payer: Self-pay | Admitting: Podiatry

## 2015-07-14 VITALS — Ht 69.0 in | Wt 158.0 lb

## 2015-07-14 DIAGNOSIS — L570 Actinic keratosis: Secondary | ICD-10-CM

## 2015-07-14 DIAGNOSIS — M216X9 Other acquired deformities of unspecified foot: Secondary | ICD-10-CM

## 2015-07-14 DIAGNOSIS — R262 Difficulty in walking, not elsewhere classified: Secondary | ICD-10-CM | POA: Diagnosis not present

## 2015-07-14 DIAGNOSIS — L84 Corns and callosities: Secondary | ICD-10-CM

## 2015-07-14 NOTE — Progress Notes (Signed)
SUBJECTIVE: 65 y.o. year old male presents stating that he was told by his PCP that he had wart like lesions under the toes last November 2016.  He was caring for his elderly mother and was not able to see podiatrist till now.  He feels he does not see the lesions under the toes any more.  He is not sure if this was due to his poor vision problem.  He does feel calluses while on barefoot that hurts to walk.  He was referred by Dr. Ileene Rubens.   OBJECTIVE: DERMATOLOGIC EXAMINATION: All nails are thin and normal appearance. Positive for plantar calluses, 1st and 5th MPJ right and 5th MPJ left.  VASCULAR EXAMINATION OF LOWER LIMBS: Pedal pulses: All pedal pulses are palpable with normal pulsation.  Temperature gradient from tibial crest to dorsum of foot is within normal bilateral. NEUROLOGIC EXAMINATION OF THE LOWER LIMBS: All epicritic and tactile sensations grossly intact.  MUSCULOSKELETAL EXAMINATION: Cavus type foot without gross deformities.  ASSESSMENT: Plantar callus under 1st and 5th MPJ right, 5th MPJ left.  PLAN: Reviewed clinical findings and available treatment options. All plantar lesions debrided.  Return as needed.

## 2015-07-14 NOTE — Patient Instructions (Signed)
Seen for possible plantar wart. No wart like tissue noted. Plantar callus under the first and 5th MPJ area debrided. Return as needed.

## 2015-07-16 DIAGNOSIS — H40003 Preglaucoma, unspecified, bilateral: Secondary | ICD-10-CM | POA: Diagnosis not present

## 2015-07-16 DIAGNOSIS — H25811 Combined forms of age-related cataract, right eye: Secondary | ICD-10-CM | POA: Diagnosis not present

## 2015-07-16 DIAGNOSIS — H35373 Puckering of macula, bilateral: Secondary | ICD-10-CM | POA: Diagnosis not present

## 2015-07-16 DIAGNOSIS — H02831 Dermatochalasis of right upper eyelid: Secondary | ICD-10-CM | POA: Diagnosis not present

## 2015-07-22 DIAGNOSIS — H25811 Combined forms of age-related cataract, right eye: Secondary | ICD-10-CM | POA: Diagnosis not present

## 2015-07-22 DIAGNOSIS — J449 Chronic obstructive pulmonary disease, unspecified: Secondary | ICD-10-CM | POA: Diagnosis not present

## 2015-07-22 DIAGNOSIS — H2181 Floppy iris syndrome: Secondary | ICD-10-CM | POA: Diagnosis not present

## 2015-07-22 DIAGNOSIS — I10 Essential (primary) hypertension: Secondary | ICD-10-CM | POA: Diagnosis not present

## 2015-08-06 ENCOUNTER — Other Ambulatory Visit: Payer: Self-pay | Admitting: Family Medicine

## 2015-08-19 ENCOUNTER — Other Ambulatory Visit: Payer: Self-pay | Admitting: Sports Medicine

## 2015-09-01 ENCOUNTER — Other Ambulatory Visit: Payer: Self-pay | Admitting: Family Medicine

## 2015-09-23 ENCOUNTER — Ambulatory Visit: Payer: Medicare Other | Admitting: Family Medicine

## 2015-09-23 ENCOUNTER — Encounter: Payer: Self-pay | Admitting: Family Medicine

## 2015-09-23 ENCOUNTER — Ambulatory Visit (INDEPENDENT_AMBULATORY_CARE_PROVIDER_SITE_OTHER): Payer: Medicare Other | Admitting: Family Medicine

## 2015-09-23 VITALS — BP 140/80 | HR 98 | Wt 159.0 lb

## 2015-09-23 DIAGNOSIS — J410 Simple chronic bronchitis: Secondary | ICD-10-CM

## 2015-09-23 DIAGNOSIS — F32A Depression, unspecified: Secondary | ICD-10-CM

## 2015-09-23 DIAGNOSIS — Z23 Encounter for immunization: Secondary | ICD-10-CM

## 2015-09-23 DIAGNOSIS — Z114 Encounter for screening for human immunodeficiency virus [HIV]: Secondary | ICD-10-CM

## 2015-09-23 DIAGNOSIS — N400 Enlarged prostate without lower urinary tract symptoms: Secondary | ICD-10-CM

## 2015-09-23 DIAGNOSIS — Z8639 Personal history of other endocrine, nutritional and metabolic disease: Secondary | ICD-10-CM

## 2015-09-23 DIAGNOSIS — F1010 Alcohol abuse, uncomplicated: Secondary | ICD-10-CM

## 2015-09-23 DIAGNOSIS — E785 Hyperlipidemia, unspecified: Secondary | ICD-10-CM

## 2015-09-23 DIAGNOSIS — F419 Anxiety disorder, unspecified: Secondary | ICD-10-CM

## 2015-09-23 DIAGNOSIS — F17200 Nicotine dependence, unspecified, uncomplicated: Secondary | ICD-10-CM

## 2015-09-23 DIAGNOSIS — Z8042 Family history of malignant neoplasm of prostate: Secondary | ICD-10-CM

## 2015-09-23 DIAGNOSIS — Z8739 Personal history of other diseases of the musculoskeletal system and connective tissue: Secondary | ICD-10-CM

## 2015-09-23 DIAGNOSIS — I10 Essential (primary) hypertension: Secondary | ICD-10-CM

## 2015-09-23 DIAGNOSIS — Z1159 Encounter for screening for other viral diseases: Secondary | ICD-10-CM

## 2015-09-23 DIAGNOSIS — F329 Major depressive disorder, single episode, unspecified: Secondary | ICD-10-CM

## 2015-09-23 DIAGNOSIS — R7303 Prediabetes: Secondary | ICD-10-CM

## 2015-09-23 MED ORDER — DULOXETINE HCL 20 MG PO CPEP: 20.0000 mg | ORAL_CAPSULE | Freq: Every day | ORAL | 3 refills | Status: DC

## 2015-09-23 NOTE — Patient Instructions (Signed)
Thank you for coming in today. START cymbalta daily.  Get fasting labs.  Work on cutting back on drinking and smoking.  Return for a well visit in 2-4 weeks.   Duloxetine delayed-release capsules What is this medicine? DULOXETINE (doo LOX e teen) is used to treat depression, anxiety, and different types of chronic pain. This medicine may be used for other purposes; ask your health care provider or pharmacist if you have questions. What should I tell my health care provider before I take this medicine? They need to know if you have any of these conditions: -bipolar disorder or a family history of bipolar disorder -glaucoma -kidney disease -liver disease -suicidal thoughts or a previous suicide attempt -taken medicines called MAOIs like Carbex, Eldepryl, Marplan, Nardil, and Parnate within 14 days -an unusual reaction to duloxetine, other medicines, foods, dyes, or preservatives -pregnant or trying to get pregnant -breast-feeding How should I use this medicine? Take this medicine by mouth with a glass of water. Follow the directions on the prescription label. Do not cut, crush or chew this medicine. You can take this medicine with or without food. Take your medicine at regular intervals. Do not take your medicine more often than directed. Do not stop taking this medicine suddenly except upon the advice of your doctor. Stopping this medicine too quickly may cause serious side effects or your condition may worsen. A special MedGuide will be given to you by the pharmacist with each prescription and refill. Be sure to read this information carefully each time. Talk to your pediatrician regarding the use of this medicine in children. While this drug may be prescribed for children as young as 36 years of age for selected conditions, precautions do apply. Overdosage: If you think you have taken too much of this medicine contact a poison control center or emergency room at once. NOTE: This medicine is  only for you. Do not share this medicine with others. What if I miss a dose? If you miss a dose, take it as soon as you can. If it is almost time for your next dose, take only that dose. Do not take double or extra doses. What may interact with this medicine? Do not take this medicine with any of the following medications: -certain diet drugs like dexfenfluramine, fenfluramine -desvenlafaxine -linezolid -MAOIs like Azilect, Carbex, Eldepryl, Marplan, Nardil, and Parnate -methylene blue (intravenous) -milnacipran -thioridazine -venlafaxine This medicine may also interact with the following medications: -alcohol -aspirin and aspirin-like medicines -certain antibiotics like ciprofloxacin and enoxacin -certain medicines for blood pressure, heart disease, irregular heart beat -certain medicines for depression, anxiety, or psychotic disturbances -certain medicines for migraine headache like almotriptan, eletriptan, frovatriptan, naratriptan, rizatriptan, sumatriptan, zolmitriptan -certain medicines that treat or prevent blood clots like warfarin, enoxaparin, and dalteparin -cimetidine -fentanyl -lithium -NSAIDS, medicines for pain and inflammation, like ibuprofen or naproxen -phentermine -procarbazine -sibutramine -St. John's wort -theophylline -tramadol -tryptophan This list may not describe all possible interactions. Give your health care provider a list of all the medicines, herbs, non-prescription drugs, or dietary supplements you use. Also tell them if you smoke, drink alcohol, or use illegal drugs. Some items may interact with your medicine. What should I watch for while using this medicine? Tell your doctor if your symptoms do not get better or if they get worse. Visit your doctor or health care professional for regular checks on your progress. Because it may take several weeks to see the full effects of this medicine, it is important to continue your treatment  as prescribed by  your doctor. Patients and their families should watch out for new or worsening thoughts of suicide or depression. Also watch out for sudden changes in feelings such as feeling anxious, agitated, panicky, irritable, hostile, aggressive, impulsive, severely restless, overly excited and hyperactive, or not being able to sleep. If this happens, especially at the beginning of treatment or after a change in dose, call your health care professional. Dennis Bast may get drowsy or dizzy. Do not drive, use machinery, or do anything that needs mental alertness until you know how this medicine affects you. Do not stand or sit up quickly, especially if you are an older patient. This reduces the risk of dizzy or fainting spells. Alcohol may interfere with the effect of this medicine. Avoid alcoholic drinks. This medicine can cause an increase in blood pressure. This medicine can also cause a sudden drop in your blood pressure, which may make you feel faint and increase the chance of a fall. These effects are most common when you first start the medicine or when the dose is increased, or during use of other medicines that can cause a sudden drop in blood pressure. Check with your doctor for instructions on monitoring your blood pressure while taking this medicine. Your mouth may get dry. Chewing sugarless gum or sucking hard candy, and drinking plenty of water may help. Contact your doctor if the problem does not go away or is severe. What side effects may I notice from receiving this medicine? Side effects that you should report to your doctor or health care professional as soon as possible: -allergic reactions like skin rash, itching or hives, swelling of the face, lips, or tongue -changes in blood pressure -confusion -dark urine -dizziness -fast talking and excited feelings or actions that are out of control -fast, irregular heartbeat -fever -general ill feeling or flu-like symptoms -hallucination, loss of contact with  reality -light-colored stools -loss of balance or coordination -redness, blistering, peeling or loosening of the skin, including inside the mouth -right upper belly pain -seizures -suicidal thoughts or other mood changes -trouble concentrating -trouble passing urine or change in the amount of urine -unusual bleeding or bruising -unusually weak or tired -yellowing of the eyes or skin Side effects that usually do not require medical attention (report to your doctor or health care professional if they continue or are bothersome): -blurred vision -change in appetite -change in sex drive or performance -headache -increased sweating -nausea This list may not describe all possible side effects. Call your doctor for medical advice about side effects. You may report side effects to FDA at 1-800-FDA-1088. Where should I keep my medicine? Keep out of the reach of children. Store at room temperature between 20 and 25 degrees C (68 to 77 degrees F). Throw away any unused medicine after the expiration date. NOTE: This sheet is a summary. It may not cover all possible information. If you have questions about this medicine, talk to your doctor, pharmacist, or health care provider.    2016, Elsevier/Gold Standard. (2012-12-19 [id number])

## 2015-09-24 DIAGNOSIS — F1010 Alcohol abuse, uncomplicated: Secondary | ICD-10-CM | POA: Insufficient documentation

## 2015-09-24 NOTE — Progress Notes (Signed)
Jimmy Mayo is a 65 y.o. male who presents to [facility]: [place] today for depression and anxiety as well as chronic pain, alcohol and smoking.  Depression and anxiety: Patient notes a great deal of stress at home. He is caring for his elderly demented mother which requires a great deal of effort. He does have help from other family members and feels as though he is doing a good thing for his mom. However he does note worsening depression and anxiety symptoms. He takes Xanax currently. However in the past he's tried other medications including Paxil Prozac and Zoloft which have not worked very well or been difficult to tolerate.  Additionally he has chronic back and neck pain ongoing for years. This has been managed via neurosurgery and chiropractor care. He notes the pain is moderate and interferes with normal activities. He denies significant radiating pain weakness or numbness. No fevers or chills.  Alcohol abuse: Patient notes that he's been drinking more than usual recently. He attributes this to stress. He drinks 3-4 double units of liquor most nights. He recognizes this is too much. He denies any history of alcohol withdrawal.  Tobacco use: Patient continues to smoke. In the past used tried quitting had significant difficulty. He would like to quit smoking at some point.   Past Medical History:  Diagnosis Date  . Anemia   . Anxiety   . Asthma   . Back pain   . Chronic kidney disease   . GERD (gastroesophageal reflux disease)   . Kidney stone    Past Surgical History:  Procedure Laterality Date  . COLONOSCOPY W/ BIOPSIES AND POLYPECTOMY     was to have 1 this past year but planing to see new GI in [place] realize he may need onne every 5 years  . HYDROCELE EXCISION / REPAIR     x3 or 2 on 1 and 1 on the other  . KNEE ARTHROSCOPY     1986  . LITHOTRIPSY    .  NASAL SINUS SURGERY    . SPINAL FUSION    . SPINE SURGERY  2006   l spine fusion/rods  . VASECTOMY     Social History  Substance Use Topics  . Smoking status: Current Every Day Smoker    Packs/day: 1.50    Years: 35.00  . Smokeless tobacco: Not on file  . Alcohol use Yes   family history includes Cancer in his father and mother; Diabetes in his father and maternal grandmother.  ROS as above:  Medications: Current Outpatient Prescriptions  Medication Sig Dispense Refill  . allopurinol (ZYLOPRIM) 300 MG tablet Take 1 tablet (300 mg total) by mouth 2 (two) times daily. 180 tablet 1  . ALPRAZolam (XANAX) 0.5 MG tablet TAKE 1 TABLET BY MOUTH THREE TIMES DAILY AS NEEDED FOR ANXIETY 90 tablet 0  . Ascorbic Acid (VITAMIN C PO) Take by mouth.    [name] CINNAMON PO Take by mouth.    . Cyanocobalamin (B-12 PO) Take by mouth.    . ECHINACEA PO Take by mouth.    . finasteride (PROSCAR) 5 MG tablet Take 1 tablet (5 mg total) by mouth daily. 90 tablet 1  . montelukast (SINGULAIR) 10 MG tablet Take 1 tablet (10 mg total) by mouth at bedtime. 90 tablet 0  . Multiple Vitamin (MULTIVITAMIN) capsule Take 1 capsule by mouth daily.      . pantoprazole (PROTONIX) 40 MG tablet Take 1  tablet (40 mg total) by mouth daily. 30 tablet 6  . PROAIR HFA 108 (90 Base) MCG/ACT inhaler INHALE 2 PUFFS EVERY 6 HOURS AS NEEDED (2 BOXES!!!) 17 g 0  . traMADol (ULTRAM) 50 MG tablet Take 50 mg by mouth. Take 1-2 tabs by mouth every 8 hours    . valsartan-hydrochlorothiazide (DIOVAN-HCT) 160-12.5 MG tablet Take 1 tablet by mouth daily. 90 tablet 1  . zolpidem (AMBIEN) 10 MG tablet TAKE 1 TABLET DAILY AT BEDTIME AS NEEDED FOR SLEEP 30 tablet 3  . DULoxetine (CYMBALTA) 20 MG capsule Take 1 capsule (20 mg total) by mouth daily. 30 capsule 3   No current facility-administered medications for this visit.    Allergies  Allergen Reactions  . Amoxicillin   . Niacin And Related     Flushing   . Penicillins   . Pravastatin  Sodium     REACTION: diarrhea  . Zetia [Ezetimibe]     constipation     Exam:  BP 140/80   Pulse 98   Wt 159 lb (72.1 kg)   BMI 23.48 kg/m  Gen: Well NAD HEENT: EOMI,  MMM Lungs: Normal work of breathing. CTABL Heart: RRR no MRG Abd: NABS, Soft. Nondistended, Nontender Exts: Brisk capillary refill, warm and well perfused.  Back: Nontender to spinal midline from cervical to lumbar spine. Psych: Alert and oriented normal speech thought process and affect  Depression screen PHQ 2/9 09/24/2015  Decreased Interest 1  Down, Depressed, Hopeless 1  PHQ - 2 Score 2  Altered sleeping 2  Tired, decreased energy 2  Change in appetite 2  Feeling bad or failure about yourself  2  Trouble concentrating 2  Moving slowly or fidgety/restless 2  Suicidal thoughts 0  PHQ-9 Score 14  Difficult doing work/chores Somewhat difficult   GAD 7 : Generalized Anxiety Score 09/24/2015  Nervous, Anxious, on Edge 2  Control/stop worrying 2  Worry too much - different things 2  Trouble relaxing 2  Restless 2  Easily annoyed or irritable 2  Afraid - awful might happen 1  Total GAD 7 Score 13  Anxiety Difficulty Somewhat difficult      No results found for this or any previous visit (from the past 24 hour(s)). No results found.    Assessment and Plan: 65 y.o. male with  Anxiety and depression. Plan to start Cymbalta and recheck in 2 weeks. Continue Xanax intermittently as needed consider counseling.  Chronic pain: Discussed options. We'll start Cymbalta as well.  Tobacco abuse: Recommend cutting back. Dedicated quit attempt when mental health is a bit more stable.  Alcohol abuse: Additionally recommend cutting back. We'll focus more on this issue when mental health is a bit better controlled.  We'll obtain basic fasting labs as well for other healthcare concerns.  Orders Placed This Encounter  Procedures  . Flu Vaccine QUAD 36+ mos IM  . CBC  . Comprehensive metabolic panel     Order Specific Question:   Has the patient fasted?    Answer:   No  . Lipid panel    Order Specific Question:   Has the patient fasted?    Answer:   No  . Hemoglobin A1c  . TSH  . VITAMIN D 25 Hydroxy (Vit-D Deficiency, Fractures)  . Uric acid  . HIV antibody  . Hepatitis C antibody    Discussed warning signs or symptoms. Please see discharge instructions. Patient expresses understanding.

## 2015-10-01 ENCOUNTER — Other Ambulatory Visit: Payer: Self-pay | Admitting: Family Medicine

## 2015-10-01 ENCOUNTER — Other Ambulatory Visit: Payer: Self-pay | Admitting: Sports Medicine

## 2015-10-03 ENCOUNTER — Other Ambulatory Visit: Payer: Self-pay

## 2015-10-03 MED ORDER — ALPRAZOLAM 0.5 MG PO TABS: 0.5000 mg | ORAL_TABLET | Freq: Three times a day (TID) | ORAL | 0 refills | Status: DC | PRN

## 2015-10-14 DIAGNOSIS — E785 Hyperlipidemia, unspecified: Secondary | ICD-10-CM | POA: Diagnosis not present

## 2015-10-14 DIAGNOSIS — I10 Essential (primary) hypertension: Secondary | ICD-10-CM | POA: Diagnosis not present

## 2015-10-14 DIAGNOSIS — R7303 Prediabetes: Secondary | ICD-10-CM | POA: Diagnosis not present

## 2015-10-15 LAB — CBC
HCT: 47.2 % (ref 38.5–50.0)
Hemoglobin: 15.9 g/dL (ref 13.2–17.1)
MCH: 32 pg (ref 27.0–33.0)
MCHC: 33.7 g/dL (ref 32.0–36.0)
MCV: 95 fL (ref 80.0–100.0)
MPV: 13.1 fL — ABNORMAL HIGH (ref 7.5–12.5)
Platelets: 241 10*3/uL (ref 140–400)
RBC: 4.97 MIL/uL (ref 4.20–5.80)
RDW: 14.6 % (ref 11.0–15.0)
WBC: 8.7 10*3/uL (ref 3.8–10.8)

## 2015-10-15 LAB — HEMOGLOBIN A1C
Hgb A1c MFr Bld: 5.5 % (ref <5.7)
Mean Plasma Glucose: 111 mg/dL

## 2015-10-15 LAB — COMPREHENSIVE METABOLIC PANEL
ALBUMIN: 4.3 g/dL (ref 3.6–5.1)
ALK PHOS: 66 U/L (ref 40–115)
ALT: 26 U/L (ref 9–46)
AST: 28 U/L (ref 10–35)
BILIRUBIN TOTAL: 0.5 mg/dL (ref 0.2–1.2)
BUN: 20 mg/dL (ref 7–25)
CALCIUM: 10 mg/dL (ref 8.6–10.3)
CO2: 31 mmol/L (ref 20–31)
CREATININE: 0.97 mg/dL (ref 0.70–1.25)
Chloride: 99 mmol/L (ref 98–110)
Glucose, Bld: 95 mg/dL (ref 65–99)
Potassium: 4.4 mmol/L (ref 3.5–5.3)
SODIUM: 139 mmol/L (ref 135–146)
TOTAL PROTEIN: 7 g/dL (ref 6.1–8.1)

## 2015-10-15 LAB — HEPATITIS C ANTIBODY: HCV AB: NEGATIVE

## 2015-10-15 LAB — LIPID PANEL
CHOLESTEROL: 200 mg/dL (ref 125–200)
HDL: 44 mg/dL (ref >=40)
TRIGLYCERIDES: 439 mg/dL — AB (ref <150)
Total CHOL/HDL Ratio: 4.5 Ratio (ref <=5.0)

## 2015-10-15 LAB — VITAMIN D 25 HYDROXY (VIT D DEFICIENCY, FRACTURES): VIT D 25 HYDROXY: 39 ng/mL (ref 30–100)

## 2015-10-15 LAB — HIV ANTIBODY (ROUTINE TESTING W REFLEX): HIV 1&2 Ab, 4th Generation: NONREACTIVE

## 2015-10-15 LAB — TSH: TSH: 1.75 m[IU]/L (ref 0.40–4.50)

## 2015-10-15 LAB — URIC ACID: Uric Acid, Serum: 4.6 mg/dL (ref 4.0–8.0)

## 2015-10-21 ENCOUNTER — Ambulatory Visit (INDEPENDENT_AMBULATORY_CARE_PROVIDER_SITE_OTHER): Payer: Medicare Other | Admitting: Family Medicine

## 2015-10-21 ENCOUNTER — Encounter: Payer: Self-pay | Admitting: Family Medicine

## 2015-10-21 VITALS — BP 147/82 | HR 104 | Ht 69.0 in | Wt 160.0 lb

## 2015-10-21 DIAGNOSIS — E782 Mixed hyperlipidemia: Secondary | ICD-10-CM | POA: Diagnosis not present

## 2015-10-21 DIAGNOSIS — Z0000 Encounter for general adult medical examination without abnormal findings: Secondary | ICD-10-CM

## 2015-10-21 NOTE — Progress Notes (Signed)
Subjective:    Jimmy Mayo is a 65 y.o. male who presents for Medicare Annual/Subsequent preventive examination.   Preventive Screening-Counseling & Management  Tobacco History  Smoking Status  . Current Every Day Smoker  . Packs/day: 1.50  . Years: 35.00  Smokeless Tobacco  . Not on file    Problems Prior to Visit 1. Anxiety and alcohol abuse  Current Problems (verified) Patient Active Problem List   Diagnosis Date Noted  . Alcohol abuse 09/24/2015  . BPH (benign prostatic hyperplasia) 07/02/2015  . Chronic bronchitis ([place]) 05/23/2015  . Pigmented skin lesion 09/27/2012  . Dry eye 09/27/2012  . Personal history of colonic polyps 06/01/2012  . Abdominal pain, epigastric 06/01/2012  . Hyperlipidemia 02/16/2012  . Prediabetes 02/16/2012  . Seborrheic keratosis 02/16/2012  . Anxiety 02/15/2012  . History of gout 12/29/2011  . Depression 12/29/2011  . Family history of prostate cancer 12/29/2011  . Essential hypertension, benign 02/23/2010  . NEPHROLITHIASIS 10/15/2009  . MECHANICAL PTOSIS 12/09/2008  . NICOTINE ADDICTION 08/02/2007  . HEMATURIA UNSPECIFIED 08/02/2007  . [place] DISEASE, LUMBAR 01/04/2007  . Insomnia 01/04/2007    Medications Prior to Visit Current Outpatient Prescriptions on File Prior to Visit  Medication Sig Dispense Refill  . allopurinol (ZYLOPRIM) 300 MG tablet Take 1 tablet (300 mg total) by mouth 2 (two) times daily. 180 tablet 0  . ALPRAZolam (XANAX) 0.5 MG tablet Take 1 tablet (0.5 mg total) by mouth 3 (three) times daily as needed. for anxiety 90 tablet 0  . Ascorbic Acid (VITAMIN C PO) Take by mouth.    [name] CINNAMON PO Take by mouth.    . Cyanocobalamin (B-12 PO) Take by mouth.    . DULoxetine (CYMBALTA) 20 MG capsule Take 1 capsule (20 mg total) by mouth daily. 30 capsule 3  . ECHINACEA PO Take by mouth.    . finasteride (PROSCAR) 5 MG tablet Take 1 tablet (5 mg total) by mouth daily. 90 tablet 0  . montelukast (SINGULAIR) 10 MG tablet  Take 1 tablet (10 mg total) by mouth at bedtime. 90 tablet 0  . Multiple Vitamin (MULTIVITAMIN) capsule Take 1 capsule by mouth daily.      . pantoprazole (PROTONIX) 40 MG tablet Take 1 tablet (40 mg total) by mouth daily. 30 tablet 6  . PROAIR HFA 108 (90 Base) MCG/ACT inhaler INHALE 2 PUFFS EVERY 6 HOURS AS NEEDED (2 BOXES!!!) 17 g 0  . traMADol (ULTRAM) 50 MG tablet Take 50 mg by mouth. Take 1-2 tabs by mouth every 8 hours    . valsartan-hydrochlorothiazide (DIOVAN-HCT) 160-12.5 MG tablet Take 1 tablet by mouth daily. 90 tablet 1  . zolpidem (AMBIEN) 10 MG tablet TAKE 1 TABLET DAILY AT BEDTIME AS NEEDED FOR SLEEP 30 tablet 3   No current facility-administered medications on file prior to visit.     Current Medications (verified) Current Outpatient Prescriptions  Medication Sig Dispense Refill  . allopurinol (ZYLOPRIM) 300 MG tablet Take 1 tablet (300 mg total) by mouth 2 (two) times daily. 180 tablet 0  . ALPRAZolam (XANAX) 0.5 MG tablet Take 1 tablet (0.5 mg total) by mouth 3 (three) times daily as needed. for anxiety 90 tablet 0  . Ascorbic Acid (VITAMIN C PO) Take by mouth.    [name] CINNAMON PO Take by mouth.    . Cyanocobalamin (B-12 PO) Take by mouth.    . DULoxetine (CYMBALTA) 20 MG capsule Take 1 capsule (20 mg total) by mouth daily. 30 capsule 3  . ECHINACEA  PO Take by mouth.    . finasteride (PROSCAR) 5 MG tablet Take 1 tablet (5 mg total) by mouth daily. 90 tablet 0  . montelukast (SINGULAIR) 10 MG tablet Take 1 tablet (10 mg total) by mouth at bedtime. 90 tablet 0  . Multiple Vitamin (MULTIVITAMIN) capsule Take 1 capsule by mouth daily.      . pantoprazole (PROTONIX) 40 MG tablet Take 1 tablet (40 mg total) by mouth daily. 30 tablet 6  . PROAIR HFA 108 (90 Base) MCG/ACT inhaler INHALE 2 PUFFS EVERY 6 HOURS AS NEEDED (2 BOXES!!!) 17 g 0  . traMADol (ULTRAM) 50 MG tablet Take 50 mg by mouth. Take 1-2 tabs by mouth every 8 hours    . valsartan-hydrochlorothiazide (DIOVAN-HCT)  160-12.5 MG tablet Take 1 tablet by mouth daily. 90 tablet 1  . zolpidem (AMBIEN) 10 MG tablet TAKE 1 TABLET DAILY AT BEDTIME AS NEEDED FOR SLEEP 30 tablet 3   No current facility-administered medications for this visit.      Allergies (verified) Amoxicillin; Niacin and related; Penicillins; Pravastatin sodium; and Zetia [ezetimibe]   PAST HISTORY  Family History Family History  Problem Relation Age of Onset  . Cancer Mother     colon  . Cancer Father     colon, prostate  . Diabetes Father   . Diabetes Maternal Grandmother     Social History Social History  Substance Use Topics  . Smoking status: Current Every Day Smoker    Packs/day: 1.50    Years: 35.00  . Smokeless tobacco: Not on file  . Alcohol use Yes    Are there smokers in your home (other than you)?  No  Risk Factors Current exercise habits: The patient does not participate in regular exercise at present.  Dietary issues discussed: Non   Cardiac risk factors: advanced age (older than 57 for men, 22 for women), dyslipidemia, hypertension, male gender and smoking/ tobacco exposure.  Depression Screen Depression screen [facility] 2/9 10/21/2015 09/24/2015  Decreased Interest 1 1  Down, Depressed, Hopeless 1 1  PHQ - 2 Score 2 2  Altered sleeping 3 2  Tired, decreased energy 3 2  Change in appetite 3 2  Feeling bad or failure about yourself  2 2  Trouble concentrating 1 2  Moving slowly or fidgety/restless 1 2  Suicidal thoughts 0 0  PHQ-9 Score 15 14  Difficult doing work/chores Somewhat difficult Somewhat difficult     Activities of Daily Living In your present state of health, do you have any difficulty performing the following activities?:  Driving? No Managing money?  No Feeding yourself? No Getting from bed to chair? No Climbing a flight of stairs? No Preparing food and eating?: No Bathing or showering? No Getting dressed: No Getting to the toilet? No Using the toilet:No Moving around from place  to place: No In the past year have you fallen or had a near fall?:Yes   Are you sexually active?  No  Do you have more than one partner?  No  Hearing Difficulties: No Do you often ask people to speak up or repeat themselves? No Do you experience ringing or noises in your ears? No Do you have difficulty understanding soft or whispered voices? No   Do you feel that you have a problem with memory? No  Do you often misplace items? No  Do you feel safe at home?  Yes  Cognitive Testing  Alert? Yes  Normal Appearance?Yes  Oriented to person? Yes  Place? Yes  Time? Yes  Recall of three objects?  Yes  Can perform simple calculations? Yes  Displays appropriate judgment?Yes  Can read the correct time from a watch face?Yes   Advanced Directives have been discussed with the patient? No   List the Names of Other Physician/Practitioners you currently use: 1.    Indicate any recent Medical Services you may have received from other than [facility] providers in the past year (date may be approximate).  Immunization History  Administered Date(s) Administered  . H1N1 12/26/2007  . Influenza Split 09/29/2010, 10/21/2011  . Influenza Whole 01/04/2007, 12/09/2008, 10/15/2009  . Influenza,inj,Quad PF,36+ Mos 09/27/2012, 10/18/2013, 12/11/2014, 09/23/2015  . Pneumococcal Polysaccharide-23 12/29/2011  . Td 08/02/2007  . Zoster 01/07/2012    Screening Tests Health Maintenance  Topic Date Due  . TETANUS/TDAP  08/01/2017  . COLONOSCOPY  06/01/2022  . INFLUENZA VACCINE  Completed  . ZOSTAVAX  Completed  . Hepatitis C Screening  Completed  . HIV Screening  Completed    All answers were reviewed with the patient and necessary referrals were made:  Lynne Leader, MD   10/21/2015   History reviewed: allergies, current medications, past family history, past medical history, past social history, past surgical history and problem list  Review of Systems Pertinent items are noted in HPI.     Objective:     Vision by Snellen chart: right eye:20/20, left eye:20/30 Blood pressure (!) 147/82, pulse (!) 104, height 5\' 9"  (1.753 m), weight 160 lb (72.6 kg). Body mass index is 23.63 kg/m.  BP (!) 147/82   Pulse (!) 104   Ht 5\' 9"  (1.753 m)   Wt 160 lb (72.6 kg)   BMI 23.63 kg/m   General Appearance:    Alert, cooperative, no distress, appears stated age  Head:    Normocephalic, without obvious abnormality, atraumatic  Eyes:    PERRL, conjunctiva/corneas clear, EOM's intact,        Ears:    Normal TM's and external ear canals, both ears  Nose:   Nares normal, septum midline, mucosa normal, no drainage    or sinus tenderness  Throat:   Lips, mucosa, and tongue normal; teeth and gums normal  Neck:   Supple, symmetrical, trachea midline, no adenopathy;       thyroid:  No enlargement/tenderness/nodules; no carotid   bruit or JVD  Back:     Symmetric, no curvature, ROM normal, no CVA tenderness  Lungs:     Clear to auscultation bilaterally, respirations unlabored  Chest wall:    No tenderness or deformity  Heart:    Regular rate and rhythm, S1 and S2 normal, no murmur, rub   or gallop  Abdomen:     Soft, non-tender, bowel sounds active all four quadrants,    no masses, no organomegaly        Extremities:   Extremities normal, atraumatic, no cyanosis or edema  Pulses:   2+ and symmetric all extremities  Skin:   Skin color, texture, turgor normal, no rashes or lesions  Lymph nodes:   Cervical, supraclavicular, and axillary nodes normal  Neurologic:   CNII-XII intact. Normal strength, sensation and reflexes      throughout        Assessment:     Well Medicare dullness. Additionally discussed anxiety and alcohol abuse. Been discussed in previous visits as well. Plan to try Cymbalta and consider 12-step program. Labs reviewed Plan to recheck lipid labs which had elevated triglycerides     Plan:  During the course of the visit the patient was educated and  counseled about appropriate screening and preventive services including:     Diet review for nutrition referral? Yes ____  Not Indicated _xx___   Patient Instructions (the written plan) was given to the patient.  Medicare Attestation I have personally reviewed: The patient's medical and social history Their use of alcohol, tobacco or illicit drugs Their current medications and supplements The patient's functional ability including ADLs,fall risks, home safety risks, cognitive, and hearing and visual impairment Diet and physical activities Evidence for depression or mood disorders  The patient's weight, height, BMI, and visual acuity have been recorded in the chart.  I have made referrals, counseling, and provided education to the patient based on review of the above and I have provided the patient with a written personalized care plan for preventive services.    Lynne Leader, MD   10/21/2015

## 2015-10-21 NOTE — Patient Instructions (Signed)
Thank you for coming in today. Please obtain repeat fasting cholesterol lab.  Return in 3 months or sooner if needed. I recommend using a 12-step program like Alcoholics Anonymous for alcohol cessation.  Abdominal aortic aneurysm screening starts 75.  Alcohol Use Disorder Alcohol use disorder is a mental disorder. It is not a one-time incident of heavy drinking. Alcohol use disorder is the excessive and uncontrollable use of alcohol over time that leads to problems with functioning in one or more areas of daily living. People with this disorder risk harming themselves and others when they drink to excess. Alcohol use disorder also can cause other mental disorders, such as mood and anxiety disorders, and serious physical problems. People with alcohol use disorder often misuse other drugs.  Alcohol use disorder is common and widespread. Some people with this disorder drink alcohol to cope with or escape from negative life events. Others drink to relieve chronic pain or symptoms of mental illness. People with a family history of alcohol use disorder are at higher risk of losing control and using alcohol to excess.  Drinking too much alcohol can cause injury, accidents, and health problems. One drink can be too much when you are:  Working.  Pregnant or breastfeeding.  Taking medicines. Ask your doctor.  Driving or planning to drive. SYMPTOMS  Signs and symptoms of alcohol use disorder may include the following:   Consumption ofalcohol inlarger amounts or over a longer period of time than intended.  Multiple unsuccessful attempts to cutdown or control alcohol use.   A great deal of time spent obtaining alcohol, using alcohol, or recovering from the effects of alcohol (hangover).  A strong desire or urge to use alcohol (cravings).   Continued use of alcohol despite problems at work, school, or home because of alcohol use.   Continued use of alcohol despite problems in relationships  because of alcohol use.  Continued use of alcohol in situations when it is physically hazardous, such as driving a car.  Continued use of alcohol despite awareness of a physical or psychological problem that is likely related to alcohol use. Physical problems related to alcohol use can involve the brain, heart, liver, stomach, and intestines. Psychological problems related to alcohol use include intoxication, depression, anxiety, psychosis, delirium, and dementia.   The need for increased amounts of alcohol to achieve the same desired effect, or a decreased effect from the consumption of the same amount of alcohol (tolerance).  Withdrawal symptoms upon reducing or stopping alcohol use, or alcohol use to reduce or avoid withdrawal symptoms. Withdrawal symptoms include:  Racing heart.  Hand tremor.  Difficulty sleeping.  Nausea.  Vomiting.  Hallucinations.  Restlessness.  Seizures. DIAGNOSIS Alcohol use disorder is diagnosed through an assessment by your health care provider. Your health care provider may start by asking three or four questions to screen for excessive or problematic alcohol use. To confirm a diagnosis of alcohol use disorder, at least two symptoms must be present within a 27-month period. The severity of alcohol use disorder depends on the number of symptoms:  Mild--two or three.  Moderate--four or five.  Severe--six or more. Your health care provider may perform a physical exam or use results from lab tests to see if you have physical problems resulting from alcohol use. Your health care provider may refer you to a mental health professional for evaluation. TREATMENT  Some people with alcohol use disorder are able to reduce their alcohol use to low-risk levels. Some people with alcohol use disorder  need to quit drinking alcohol. When necessary, mental health professionals with specialized training in substance use treatment can help. Your health care provider can  help you decide how severe your alcohol use disorder is and what type of treatment you need. The following forms of treatment are available:   Detoxification. Detoxification involves the use of prescription medicines to prevent alcohol withdrawal symptoms in the first week after quitting. This is important for people with a history of symptoms of withdrawal and for heavy drinkers who are likely to have withdrawal symptoms. Alcohol withdrawal can be dangerous and, in severe cases, cause death. Detoxification is usually provided in a hospital or in-patient substance use treatment facility.  Counseling or talk therapy. Talk therapy is provided by substance use treatment counselors. It addresses the reasons people use alcohol and ways to keep them from drinking again. The goals of talk therapy are to help people with alcohol use disorder find healthy activities and ways to cope with life stress, to identify and avoid triggers for alcohol use, and to handle cravings, which can cause relapse.  Medicines.Different medicines can help treat alcohol use disorder through the following actions:  Decrease alcohol cravings.  Decrease the positive reward response felt from alcohol use.  Produce an uncomfortable physical reaction when alcohol is used (aversion therapy).  Support groups. Support groups are run by people who have quit drinking. They provide emotional support, advice, and guidance. These forms of treatment are often combined. Some people with alcohol use disorder benefit from intensive combination treatment provided by specialized substance use treatment centers. Both inpatient and outpatient treatment programs are available.   This information is not intended to replace advice given to you by your health care provider. Make sure you discuss any questions you have with your health care provider.   Document Released: 02/05/2004 Document Revised: 01/18/2014 Document Reviewed: 04/06/2012 Elsevier  Interactive Patient Education [employer].

## 2015-11-17 ENCOUNTER — Other Ambulatory Visit: Payer: Self-pay | Admitting: Family Medicine

## 2015-11-19 DIAGNOSIS — E782 Mixed hyperlipidemia: Secondary | ICD-10-CM | POA: Diagnosis not present

## 2015-11-20 ENCOUNTER — Other Ambulatory Visit: Payer: Self-pay | Admitting: Family Medicine

## 2015-11-20 LAB — LIPID PANEL
CHOL/HDL RATIO: 3.4 ratio (ref <5.0)
CHOLESTEROL: 182 mg/dL (ref <200)
HDL: 53 mg/dL (ref >40)
LDL CALC: 96 mg/dL
TRIGLYCERIDES: 167 mg/dL — AB (ref <150)
VLDL: 33 mg/dL — AB (ref <30)

## 2015-11-20 LAB — LDL CHOLESTEROL, DIRECT: Direct LDL: 102 mg/dL (ref <130)

## 2015-11-21 ENCOUNTER — Telehealth: Payer: Self-pay | Admitting: Family Medicine

## 2015-11-21 MED ORDER — PITAVASTATIN CALCIUM 2 MG PO TABS: 2.0000 mg | ORAL_TABLET | Freq: Every day | ORAL | 0 refills | Status: DC

## 2015-11-21 NOTE — Telephone Encounter (Signed)
-----   Message from Georgiann Mccoy, [place] sent at 11/20/2015 11:12 AM EST ----- Pt would like to start a statin.

## 2015-11-21 NOTE — Telephone Encounter (Signed)
Livalo sent in.  We can give the patient a coupon that will make it cheaper.

## 2015-12-16 ENCOUNTER — Encounter: Payer: Self-pay | Admitting: Family Medicine

## 2015-12-16 ENCOUNTER — Ambulatory Visit (INDEPENDENT_AMBULATORY_CARE_PROVIDER_SITE_OTHER): Payer: Medicare Other | Admitting: Family Medicine

## 2015-12-16 VITALS — BP 142/80 | HR 106 | Temp 98.4°F | Wt 156.0 lb

## 2015-12-16 DIAGNOSIS — J209 Acute bronchitis, unspecified: Secondary | ICD-10-CM

## 2015-12-16 MED ORDER — PREDNISONE 10 MG PO TABS: 30.0000 mg | ORAL_TABLET | Freq: Every day | ORAL | 0 refills | Status: DC

## 2015-12-16 MED ORDER — AZITHROMYCIN 250 MG PO TABS: 250.0000 mg | ORAL_TABLET | Freq: Every day | ORAL | 0 refills | Status: DC

## 2015-12-16 NOTE — Progress Notes (Signed)
Jimmy Mayo is a 65 y.o. male who presents to [facility]: [facility] today for sinus pain and pressure cough congestion and runny nose wheezing and shortness of breath. Symptoms present initially for about a week worsening recently. No vomiting or diarrhea. Symptoms are consistent with previous episodes of bacterial sinus infection and bronchitis. He's not had much treatment yet. He has uses albuterol inhaler which does help temporarily. He feels well otherwise. He notes typically azithromycin helps a lot for the symptoms.   Past Medical History:  Diagnosis Date  . Anemia   . Anxiety   . Asthma   . Back pain   . Chronic kidney disease   . GERD (gastroesophageal reflux disease)   . Kidney stone    Past Surgical History:  Procedure Laterality Date  . COLONOSCOPY W/ BIOPSIES AND POLYPECTOMY     was to have 1 this past year but planing to see new GI in [place] realize he may need onne every 5 years  . HYDROCELE EXCISION / REPAIR     x3 or 2 on 1 and 1 on the other  . KNEE ARTHROSCOPY     1986  . LITHOTRIPSY    . NASAL SINUS SURGERY    . SPINAL FUSION    . SPINE SURGERY  2006   l spine fusion/rods  . VASECTOMY     Social History  Substance Use Topics  . Smoking status: Current Every Day Smoker    Packs/day: 1.50    Years: 35.00  . Smokeless tobacco: Not on file  . Alcohol use Yes   family history includes Cancer in his father and mother; Diabetes in his father and maternal grandmother.  ROS as above:  Medications: Current Outpatient Prescriptions  Medication Sig Dispense Refill  . allopurinol (ZYLOPRIM) 300 MG tablet Take 1 tablet (300 mg total) by mouth 2 (two) times daily. 180 tablet 0  . ALPRAZolam (XANAX) 0.5 MG tablet Take 1 tablet (0.5 mg total) by mouth 3 (three) times daily as needed. 90 tablet 0  . Ascorbic Acid (VITAMIN C PO) Take by mouth.    [name]  CINNAMON PO Take by mouth.    . Cyanocobalamin (B-12 PO) Take by mouth.    . DULoxetine (CYMBALTA) 20 MG capsule Take 1 capsule (20 mg total) by mouth daily. 30 capsule 3  . ECHINACEA PO Take by mouth.    . finasteride (PROSCAR) 5 MG tablet Take 1 tablet (5 mg total) by mouth daily. 90 tablet 0  . montelukast (SINGULAIR) 10 MG tablet Take 1 tablet (10 mg total) by mouth at bedtime. 90 tablet 0  . Multiple Vitamin (MULTIVITAMIN) capsule Take 1 capsule by mouth daily.      . pantoprazole (PROTONIX) 40 MG tablet Take 1 tablet (40 mg total) by mouth daily. 30 tablet 6  . Pitavastatin Calcium 2 MG TABS Take 1 tablet (2 mg total) by mouth daily. 90 tablet 0  . PROAIR HFA 108 (90 Base) MCG/ACT inhaler INHALE 2 PUFFS EVERY 6 HOURS AS NEEDED (2 BOXES!!!) 17 g 5  . traMADol (ULTRAM) 50 MG tablet Take 50 mg by mouth. Take 1-2 tabs by mouth every 8 hours    . valsartan-hydrochlorothiazide (DIOVAN-HCT) 160-12.5 MG tablet Take 1 tablet by mouth daily. 90 tablet 1  . zolpidem (AMBIEN) 10 MG tablet TAKE 1 TABLET DAILY AT BEDTIME AS NEEDED FOR SLEEP 30 tablet 3  . azithromycin (ZITHROMAX) 250 MG tablet Take  1 tablet (250 mg total) by mouth daily. Take first 2 tablets together, then 1 every day until finished. 6 tablet 0  . predniSONE (DELTASONE) 10 MG tablet Take 3 tablets (30 mg total) by mouth daily with breakfast. 15 tablet 0   No current facility-administered medications for this visit.    Allergies  Allergen Reactions  . Amoxicillin   . Niacin And Related     Flushing   . Penicillins   . Pravastatin Sodium     REACTION: diarrhea  . Zetia [Ezetimibe]     constipation    Health Maintenance Health Maintenance  Topic Date Due  . TETANUS/TDAP  08/01/2017  . COLONOSCOPY  06/01/2022  . INFLUENZA VACCINE  Completed  . ZOSTAVAX  Completed  . Hepatitis C Screening  Completed  . HIV Screening  Completed     Exam:  BP (!) 142/80   Pulse (!) 106   Temp 98.4 F (36.9 C) (Oral)   Wt 156 lb (70.8  kg)   SpO2 97%   BMI 23.04 kg/m  Gen: Well NAD HEENT: EOMI,  MMM Clear nasal discharge. Posterior pharynx with cobblestoning. Tender to palpation bilateral maxillary sinuses. Left tympanic membrane is retracted right is normal. Lungs: Normal work of breathing. CTABL Heart: Mild tachycardia present regular rhythm no MRG Abd: NABS, Soft. Nondistended, Nontender Exts: Brisk capillary refill, warm and well perfused.    No results found for this or any previous visit (from the past 72 hour(s)). No results found.    Assessment and Plan: 65 y.o. male with viral sinusitis and bronchitis possible with early development of bacterial process. Treat empirically with prednisone and azithromycin. Return sooner if worsening.   No orders of the defined types were placed in this encounter.   Discussed warning signs or symptoms. Please see discharge instructions. Patient expresses understanding.

## 2015-12-16 NOTE — Patient Instructions (Signed)
Thank you for coming in today. Call or go to the emergency room if you get worse, have trouble breathing, have chest pains, or palpitations.  Take prednisone and azithromycin.    Acute Bronchitis, Adult Acute bronchitis is sudden (acute) swelling of the air tubes (bronchi) in the lungs. Acute bronchitis causes these tubes to fill with mucus, which can make it hard to breathe. It can also cause coughing or wheezing. In adults, acute bronchitis usually goes away within 2 weeks. A cough caused by bronchitis may last up to 3 weeks. Smoking, allergies, and asthma can make the condition worse. Repeated episodes of bronchitis may cause further lung problems, such as chronic obstructive pulmonary disease (COPD). What are the causes? This condition can be caused by germs and by substances that irritate the lungs, including:  Cold and flu viruses. This condition is most often caused by the same virus that causes a cold.  Bacteria.  Exposure to tobacco smoke, dust, fumes, and air pollution. What increases the risk? This condition is more likely to develop in people who:  Have close contact with someone with acute bronchitis.  Are exposed to lung irritants, such as tobacco smoke, dust, fumes, and vapors.  Have a weak immune system.  Have a respiratory condition such as asthma. What are the signs or symptoms? Symptoms of this condition include:  A cough.  Coughing up clear, yellow, or green mucus.  Wheezing.  Chest congestion.  Shortness of breath.  A fever.  Body aches.  Chills.  A sore throat. How is this diagnosed? This condition is usually diagnosed with a physical exam. During the exam, your health care provider may order tests, such as chest X-rays, to rule out other conditions. He or she may also:  Test a sample of your mucus for bacterial infection.  Check the level of oxygen in your blood. This is done to check for pneumonia.  Do a chest X-ray or lung function  testing to rule out pneumonia and other conditions.  Perform blood tests. Your health care provider will also ask about your symptoms and medical history. How is this treated? Most cases of acute bronchitis clear up over time without treatment. Your health care provider may recommend:  Drinking more fluids. Drinking more makes your mucus thinner, which may make it easier to breathe.  Taking a medicine for a fever or cough.  Taking an antibiotic medicine.  Using an inhaler to help improve shortness of breath and to control a cough.  Using a cool mist vaporizer or humidifier to make it easier to breathe. Follow these instructions at home: Medicines  Take over-the-counter and prescription medicines only as told by your health care provider.  If you were prescribed an antibiotic, take it as told by your health care provider. Do not stop taking the antibiotic even if you start to feel better. General instructions  Get plenty of rest.  Drink enough fluids to keep your urine clear or pale yellow.  Avoid smoking and secondhand smoke. Exposure to cigarette smoke or irritating chemicals will make bronchitis worse. If you smoke and you need help quitting, ask your health care provider. Quitting smoking will help your lungs heal faster.  Use an inhaler, cool mist vaporizer, or humidifier as told by your health care provider.  Keep all follow-up visits as told by your health care provider. This is important. How is this prevented? To lower your risk of getting this condition again:  Wash your hands often with soap and water.  If soap and water are not available, use hand sanitizer.  Avoid contact with people who have cold symptoms.  Try not to touch your hands to your mouth, nose, or eyes.  Make sure to get the flu shot every year. Contact a health care provider if:  Your symptoms do not improve in 2 weeks of treatment. Get help right away if:  You cough up blood.  You have chest  pain.  You have severe shortness of breath.  You become dehydrated.  You faint or keep feeling like you are going to faint.  You keep vomiting.  You have a severe headache.  Your fever or chills gets worse. This information is not intended to replace advice given to you by your health care provider. Make sure you discuss any questions you have with your health care provider. Document Released: 02/05/2004 Document Revised: 07/23/2015 Document Reviewed: 06/18/2015 Elsevier Interactive Patient Education  2017 [employer].

## 2015-12-23 DIAGNOSIS — M47812 Spondylosis without myelopathy or radiculopathy, cervical region: Secondary | ICD-10-CM | POA: Diagnosis not present

## 2015-12-23 DIAGNOSIS — M47816 Spondylosis without myelopathy or radiculopathy, lumbar region: Secondary | ICD-10-CM | POA: Diagnosis not present

## 2015-12-30 ENCOUNTER — Other Ambulatory Visit: Payer: Self-pay | Admitting: Family Medicine

## 2016-01-29 ENCOUNTER — Other Ambulatory Visit: Payer: Self-pay | Admitting: Physician Assistant

## 2016-02-27 ENCOUNTER — Other Ambulatory Visit: Payer: Self-pay | Admitting: Family Medicine

## 2016-02-27 NOTE — Telephone Encounter (Signed)
This prescription is for next month. Do you want him to have more than 1 refill? Please advise.

## 2016-03-01 NOTE — Telephone Encounter (Signed)
He needs an appointment to discuss anxiety control.

## 2016-03-01 NOTE — Telephone Encounter (Signed)
Pt returned call and was advised to schedule an appt. States that he will call back to schedule.

## 2016-03-29 ENCOUNTER — Other Ambulatory Visit: Payer: Self-pay | Admitting: Family Medicine

## 2016-04-01 ENCOUNTER — Ambulatory Visit: Payer: Medicare Other | Admitting: Family Medicine

## 2016-04-01 ENCOUNTER — Encounter: Payer: Self-pay | Admitting: Family Medicine

## 2016-04-01 ENCOUNTER — Ambulatory Visit (INDEPENDENT_AMBULATORY_CARE_PROVIDER_SITE_OTHER): Payer: Medicare Other | Admitting: Family Medicine

## 2016-04-01 VITALS — BP 144/77 | HR 106 | Wt 155.0 lb

## 2016-04-01 DIAGNOSIS — E782 Mixed hyperlipidemia: Secondary | ICD-10-CM

## 2016-04-01 DIAGNOSIS — F419 Anxiety disorder, unspecified: Secondary | ICD-10-CM

## 2016-04-01 MED ORDER — ALPRAZOLAM 0.5 MG PO TABS: 0.5000 mg | ORAL_TABLET | Freq: Three times a day (TID) | ORAL | 5 refills | Status: DC | PRN

## 2016-04-01 NOTE — Patient Instructions (Addendum)
Thank you for coming in today. Lets retry the Cymbalta (duloxetine) . Take 1 pill daily. We will readdress in 1 month and plan on increasing the dose as tolerated.  Continue xanax but work on weaning to 58 pills in 1 month. Return sooner if needed.   Recheck in 1 month.   Duloxetine delayed-release capsules What is this medicine? DULOXETINE (doo LOX e teen) is used to treat depression, anxiety, and different types of chronic pain. This medicine may be used for other purposes; ask your health care provider or pharmacist if you have questions. COMMON BRAND NAME(S): Cymbalta, [place] What should I tell my health care provider before I take this medicine? They need to know if you have any of these conditions: -bipolar disorder or a family history of bipolar disorder -glaucoma -kidney disease -liver disease -suicidal thoughts or a previous suicide attempt -taken medicines called MAOIs like Carbex, Eldepryl, Marplan, Nardil, and Parnate within 14 days -an unusual reaction to duloxetine, other medicines, foods, dyes, or preservatives -pregnant or trying to get pregnant -breast-feeding How should I use this medicine? Take this medicine by mouth with a glass of water. Follow the directions on the prescription label. Do not cut, crush or chew this medicine. You can take this medicine with or without food. Take your medicine at regular intervals. Do not take your medicine more often than directed. Do not stop taking this medicine suddenly except upon the advice of your doctor. Stopping this medicine too quickly may cause serious side effects or your condition may worsen. A special MedGuide will be given to you by the pharmacist with each prescription and refill. Be sure to read this information carefully each time. Talk to your pediatrician regarding the use of this medicine in children. While this drug may be prescribed for children as young as 79 years of age for selected conditions, precautions do  apply. Overdosage: If you think you have taken too much of this medicine contact a poison control center or emergency room at once. NOTE: This medicine is only for you. Do not share this medicine with others. What if I miss a dose? If you miss a dose, take it as soon as you can. If it is almost time for your next dose, take only that dose. Do not take double or extra doses. What may interact with this medicine? Do not take this medicine with any of the following medications: -desvenlafaxine -levomilnacipran -linezolid -MAOIs like Carbex, Eldepryl, Marplan, Nardil, and Parnate -methylene blue (injected into a vein) -milnacipran -thioridazine -venlafaxine This medicine may also interact with the following medications: -alcohol -amphetamines -aspirin and aspirin-like medicines -certain antibiotics like ciprofloxacin and enoxacin -certain medicines for blood pressure, heart disease, irregular heart beat -certain medicines for depression, anxiety, or psychotic disturbances -certain medicines for migraine headache like almotriptan, eletriptan, frovatriptan, naratriptan, rizatriptan, sumatriptan, zolmitriptan -certain medicines that treat or prevent blood clots like warfarin, enoxaparin, and dalteparin -cimetidine -fentanyl -lithium -NSAIDS, medicines for pain and inflammation, like ibuprofen or naproxen -phentermine -procarbazine -rasagiline -sibutramine -St. John's wort -theophylline -tramadol -tryptophan This list may not describe all possible interactions. Give your health care provider a list of all the medicines, herbs, non-prescription drugs, or dietary supplements you use. Also tell them if you smoke, drink alcohol, or use illegal drugs. Some items may interact with your medicine. What should I watch for while using this medicine? Tell your doctor if your symptoms do not get better or if they get worse. Visit your doctor or health care professional for  regular checks on your  progress. Because it may take several weeks to see the full effects of this medicine, it is important to continue your treatment as prescribed by your doctor. Patients and their families should watch out for new or worsening thoughts of suicide or depression. Also watch out for sudden changes in feelings such as feeling anxious, agitated, panicky, irritable, hostile, aggressive, impulsive, severely restless, overly excited and hyperactive, or not being able to sleep. If this happens, especially at the beginning of treatment or after a change in dose, call your health care professional. Dennis Bast may get drowsy or dizzy. Do not drive, use machinery, or do anything that needs mental alertness until you know how this medicine affects you. Do not stand or sit up quickly, especially if you are an older patient. This reduces the risk of dizzy or fainting spells. Alcohol may interfere with the effect of this medicine. Avoid alcoholic drinks. This medicine can cause an increase in blood pressure. This medicine can also cause a sudden drop in your blood pressure, which may make you feel faint and increase the chance of a fall. These effects are most common when you first start the medicine or when the dose is increased, or during use of other medicines that can cause a sudden drop in blood pressure. Check with your doctor for instructions on monitoring your blood pressure while taking this medicine. Your mouth may get dry. Chewing sugarless gum or sucking hard candy, and drinking plenty of water may help. Contact your doctor if the problem does not go away or is severe. What side effects may I notice from receiving this medicine? Side effects that you should report to your doctor or health care professional as soon as possible: -allergic reactions like skin rash, itching or hives, swelling of the face, lips, or tongue -anxious -breathing problems -confusion -changes in vision -chest pain -confusion -elevated mood,  decreased need for sleep, racing thoughts, impulsive behavior -eye pain -fast, irregular heartbeat -feeling faint or lightheaded, falls -feeling agitated, angry, or irritable -hallucination, loss of contact with reality -high blood pressure -loss of balance or coordination -palpitations -redness, blistering, peeling or loosening of the skin, including inside the mouth -restlessness, pacing, inability to keep still -seizures -stiff muscles -suicidal thoughts or other mood changes -trouble passing urine or change in the amount of urine -trouble sleeping -unusual bleeding or bruising -unusually weak or tired -vomiting -yellowing of the eyes or skin Side effects that usually do not require medical attention (report to your doctor or health care professional if they continue or are bothersome): -change in sex drive or performance -change in appetite or weight -constipation -dizziness -dry mouth -headache -increased sweating -nausea -tired This list may not describe all possible side effects. Call your doctor for medical advice about side effects. You may report side effects to FDA at 1-800-FDA-1088. Where should I keep my medicine? Keep out of the reach of children. Store at room temperature between 20 and 25 degrees C (68 to 77 degrees F). Throw away any unused medicine after the expiration date. NOTE: This sheet is a summary. It may not cover all possible information. If you have questions about this medicine, talk to your doctor, pharmacist, or health care provider.  2018 Elsevier/Gold Standard (2015-05-29 18:16:03)

## 2016-04-01 NOTE — Progress Notes (Signed)
Jimmy Mayo is a 66 y.o. male who presents to [facility] today for anxiety and hyperlipidemia.  Anxiety. Patient takes of to 3 tabs of Xanax per day. He notes this typically controls his anxiety very well. He does not take any other medications. In the past he was prescribed Cymbalta but did not take it longer than one day. He notes that his chronic pain as well as caring for his elderly mother, contributing to his anxiety.  Hyperlipidemia: Patient was diagnosed with hyperlipidemia with an elevated CV risk factor over 20%. As he was unable to tolerate pravastatin in the past he was prescribed level which is too expensive.   Past Medical History:  Diagnosis Date  . Anemia   . Anxiety   . Asthma   . Back pain   . Chronic kidney disease   . GERD (gastroesophageal reflux disease)   . Kidney stone    Past Surgical History:  Procedure Laterality Date  . COLONOSCOPY W/ BIOPSIES AND POLYPECTOMY     was to have 1 this past year but planing to see new GI in [place] realize he may need onne every 5 years  . HYDROCELE EXCISION / REPAIR     x3 or 2 on 1 and 1 on the other  . KNEE ARTHROSCOPY     1986  . LITHOTRIPSY    . NASAL SINUS SURGERY    . SPINAL FUSION    . SPINE SURGERY  2006   l spine fusion/rods  . VASECTOMY     Social History  Substance Use Topics  . Smoking status: Current Every Day Smoker    Packs/day: 1.50    Years: 35.00  . Smokeless tobacco: Never Used  . Alcohol use Yes   family history includes Cancer in his father and mother; Diabetes in his father and maternal grandmother.  ROS as above:  Medications: Current Outpatient Prescriptions  Medication Sig Dispense Refill  . allopurinol (ZYLOPRIM) 300 MG tablet Take 1 tablet (300 mg total) by mouth 2 (two) times daily. 180 tablet 0  . ALPRAZolam (XANAX) 0.5 MG tablet Take 1 tablet (0.5 mg  total) by mouth 3 (three) times daily as needed. 90 tablet 5  . Ascorbic Acid (VITAMIN C PO) Take by mouth.    [name] CINNAMON PO Take by mouth.    . DULoxetine (CYMBALTA) 20 MG capsule Take 1 capsule (20 mg total) by mouth daily. 30 capsule 3  . ECHINACEA PO Take by mouth.    . finasteride (PROSCAR) 5 MG tablet Take 1 tablet (5 mg total) by mouth daily. 90 tablet 1  . Multiple Vitamin (MULTIVITAMIN) capsule Take 1 capsule by mouth daily.      . pantoprazole (PROTONIX) 40 MG tablet Take 1 tablet (40 mg total) by mouth daily. 30 tablet 6  . predniSONE (DELTASONE) 10 MG tablet Take 3 tablets (30 mg total) by mouth daily with breakfast. 15 tablet 0  . PROAIR HFA 108 (90 Base) MCG/ACT inhaler INHALE 2 PUFFS EVERY 6 HOURS AS NEEDED (2 BOXES!!!) 17 g 5  . traMADol (ULTRAM) 50 MG tablet Take 50 mg by mouth. Take 1-2 tabs by mouth every 8 hours    . valsartan-hydrochlorothiazide (DIOVAN-HCT) 160-12.5 MG tablet Take 1 tablet by mouth daily. 90 tablet 1  . zolpidem (AMBIEN) 10 MG tablet TAKE 1 TABLET DAILY AT BEDTIME AS NEEDED FOR SLEEP 30 tablet 5   No current facility-administered medications for  this visit.    Allergies  Allergen Reactions  . Amoxicillin   . Niacin And Related     Flushing   . Penicillins   . Pravastatin Sodium     REACTION: diarrhea  . Zetia [Ezetimibe]     constipation    Health Maintenance Health Maintenance  Topic Date Due  . TETANUS/TDAP  08/01/2017  . COLONOSCOPY  06/01/2022  . INFLUENZA VACCINE  Completed  . Hepatitis C Screening  Completed  . HIV Screening  Completed     Exam:  BP (!) 144/77   Pulse (!) 106   Wt 155 lb (70.3 kg)   BMI 22.89 kg/m  Gen: Well NAD HEENT: EOMI,  MMM Lungs: Normal work of breathing. CTABL Heart: RRR no MRG Abd: NABS, Soft. Nondistended, Nontender Exts: Brisk capillary refill, warm and well perfused.  Psych: Alert and oriented normal speech thought process and affect.  GAD 7 : Generalized Anxiety Score 04/01/2016 09/24/2015    Nervous, Anxious, on Edge 1 2  Control/stop worrying 2 2  Worry too much - different things 1 2  Trouble relaxing 1 2  Restless 1 2  Easily annoyed or irritable 1 2  Afraid - awful might happen 0 1  Total GAD 7 Score 7 13  Anxiety Difficulty - Somewhat difficult      No results found for this or any previous visit (from the past 72 hour(s)). No results found.    Assessment and Plan: 66 y.o. male with  Anxiety: Ultimately would like to wean off of Xanax if possible. We'll start low-dose Cymbalta and recheck in 1 month. Continue Xanax as needed.  Hyperlipidemia: We'll consider a different statin in the future. Work on smoking cessation if possible.   No orders of the defined types were placed in this encounter.  Meds ordered this encounter  Medications  . ALPRAZolam (XANAX) 0.5 MG tablet    Sig: Take 1 tablet (0.5 mg total) by mouth 3 (three) times daily as needed.    Dispense:  90 tablet    Refill:  5     Discussed warning signs or symptoms. Please see discharge instructions. Patient expresses understanding.  Patient reviewed and the [place] controlled substance reporting system

## 2016-04-28 ENCOUNTER — Other Ambulatory Visit: Payer: Self-pay | Admitting: Family Medicine

## 2016-05-11 ENCOUNTER — Ambulatory Visit: Payer: Medicare Other | Admitting: Family Medicine

## 2016-05-25 ENCOUNTER — Ambulatory Visit: Payer: Medicare Other | Admitting: Family Medicine

## 2016-06-22 DIAGNOSIS — M47812 Spondylosis without myelopathy or radiculopathy, cervical region: Secondary | ICD-10-CM | POA: Diagnosis not present

## 2016-06-22 DIAGNOSIS — M47816 Spondylosis without myelopathy or radiculopathy, lumbar region: Secondary | ICD-10-CM | POA: Diagnosis not present

## 2016-06-30 ENCOUNTER — Other Ambulatory Visit: Payer: Self-pay | Admitting: Family Medicine

## 2016-07-22 ENCOUNTER — Other Ambulatory Visit: Payer: Self-pay | Admitting: Family Medicine

## 2016-07-29 ENCOUNTER — Other Ambulatory Visit: Payer: Self-pay | Admitting: Family Medicine

## 2016-09-02 ENCOUNTER — Ambulatory Visit (INDEPENDENT_AMBULATORY_CARE_PROVIDER_SITE_OTHER): Payer: Medicare Other | Admitting: Family Medicine

## 2016-09-02 ENCOUNTER — Encounter: Payer: Self-pay | Admitting: Family Medicine

## 2016-09-02 VITALS — BP 136/88 | HR 96 | Temp 98.3°F | Wt 151.0 lb

## 2016-09-02 DIAGNOSIS — R590 Localized enlarged lymph nodes: Secondary | ICD-10-CM | POA: Diagnosis not present

## 2016-09-02 DIAGNOSIS — G894 Chronic pain syndrome: Secondary | ICD-10-CM

## 2016-09-02 DIAGNOSIS — M5412 Radiculopathy, cervical region: Secondary | ICD-10-CM

## 2016-09-02 MED ORDER — PREDNISONE 5 MG (48) PO TBPK: ORAL_TABLET | ORAL | 0 refills | Status: DC

## 2016-09-02 NOTE — Patient Instructions (Signed)
Thank you for coming in today. Take prednisone if worse.  We will get the ball rolling for pain management.  Recheck as needed.

## 2016-09-03 DIAGNOSIS — G8929 Other chronic pain: Secondary | ICD-10-CM | POA: Insufficient documentation

## 2016-09-03 NOTE — Progress Notes (Signed)
Jimmy Mayo is a 66 y.o. male who presents to [facility] today for swollen lymph node and pain management.  Patient has mild swollen cervical lymph nodes bilaterally present for the last few days. The right is worse than the left. He denies any fevers or chills vomiting or diarrhea. He denies any fevers chills or weight loss or night sweats. Feels well otherwise.  Additionally patient notes pain radiating from the left side of his neck to the left arm. The pain does not radiate to the hand. He denies any weakness or numbness. This has been ongoing for a few weeks now it is worse with neck motion.  Lastly patient needs a referral to pain management. His neurosurgeon is retiring in a few months. He takes tramadol 50 mg approximately 5 tablets per day. This is been ongoing for years and allows him to function.   Past Medical History:  Diagnosis Date  . Anemia   . Anxiety   . Asthma   . Back pain   . Chronic kidney disease   . GERD (gastroesophageal reflux disease)   . Kidney stone    Past Surgical History:  Procedure Laterality Date  . COLONOSCOPY W/ BIOPSIES AND POLYPECTOMY     was to have 1 this past year but planing to see new GI in [place] realize he may need onne every 5 years  . HYDROCELE EXCISION / REPAIR     x3 or 2 on 1 and 1 on the other  . KNEE ARTHROSCOPY     1986  . LITHOTRIPSY    . NASAL SINUS SURGERY    . SPINAL FUSION    . SPINE SURGERY  2006   l spine fusion/rods  . VASECTOMY     Social History  Substance Use Topics  . Smoking status: Current Every Day Smoker    Packs/day: 1.50    Years: 35.00  . Smokeless tobacco: Never Used  . Alcohol use Yes   family history includes Cancer in his father and mother; Diabetes in his father and maternal grandmother.  ROS as above:  Medications: Current Outpatient Prescriptions  Medication  Sig Dispense Refill  . allopurinol (ZYLOPRIM) 300 MG tablet Take 1 tablet (300 mg total) by mouth 2 (two) times daily. 180 tablet 0  . ALPRAZolam (XANAX) 0.5 MG tablet Take 1 tablet (0.5 mg total) by mouth 3 (three) times daily as needed. 90 tablet 5  . Ascorbic Acid (VITAMIN C PO) Take by mouth.    [name] CINNAMON PO Take by mouth.    . DULoxetine (CYMBALTA) 20 MG capsule Take 1 capsule (20 mg total) by mouth daily. 30 capsule 3  . ECHINACEA PO Take by mouth.    . finasteride (PROSCAR) 5 MG tablet Take 1 tablet (5 mg total) by mouth daily. 90 tablet 1  . Multiple Vitamin (MULTIVITAMIN) capsule Take 1 capsule by mouth daily.      . pantoprazole (PROTONIX) 40 MG tablet Take 1 tablet (40 mg total) by mouth daily. 30 tablet 6  . PROAIR HFA 108 (90 Base) MCG/ACT inhaler INHALE 2 PUFFS EVERY 6 HOURS AS NEEDED 17 g 2  . traMADol (ULTRAM) 50 MG tablet Take 50 mg by mouth. Take 1-2 tabs by mouth every 8 hours    . valsartan-hydrochlorothiazide (DIOVAN-HCT) 160-12.5 MG tablet Take 1 tablet by mouth daily. 90 tablet 1  . zolpidem (AMBIEN) 10 MG tablet Take 1 tablet (10 mg total)  by mouth at bedtime as needed for sleep. DUE FOR FOLLOW UP APPOINTMENT 30 tablet 0  . predniSONE (STERAPRED UNI-PAK 48 TAB) 5 MG (48) TBPK tablet 12 day dosepack po 48 tablet 0   No current facility-administered medications for this visit.    Allergies  Allergen Reactions  . Amoxicillin   . Niacin And Related     Flushing   . Penicillins   . Pravastatin Sodium     REACTION: diarrhea  . Zetia [Ezetimibe]     constipation    Health Maintenance Health Maintenance  Topic Date Due  . PNA vac Low Risk Adult (1 of 2 - PCV13) 07/26/2016  . INFLUENZA VACCINE  09/02/2017 (Originally 08/11/2016)  . TETANUS/TDAP  08/01/2017  . COLONOSCOPY  06/01/2022  . Hepatitis C Screening  Completed  . HIV Screening  Completed     Exam:  BP 136/88   Pulse 96   Temp 98.3 F (36.8 C) (Oral)   Wt 151 lb (68.5 kg)   SpO2 97%   BMI 22.30  kg/m  Gen: Well NAD HEENT: EOMI,  MMM Mild right-sided cervical lymphadenopathy present. Normal posterior pharynx and tympanic membranes. Lungs: Normal work of breathing. CTABL Heart: RRR no MRG Abd: NABS, Soft. Nondistended, Nontender Exts: Brisk capillary refill, warm and well perfused.  C-spine: Nontender to midline normal neck motion positive left-sided Spurling's test. Upper extremity strength sensation reflexes are equal and normal throughout.   No results found for this or any previous visit (from the past 72 hour(s)). No results found.    Assessment and Plan: 66 y.o. male with  Cervical lymphadenopathy: Likely reactive due to viral. Plan for watchful waiting.  Left-sided cervical radiculopathy: Likely T1 distribution based on dermatomal pattern. Positive Spurling's test. Symptoms are mild to moderate. Plan for watchful waiting. Will use prednisone Dosepak if worsening or not improving.  Pain management: Patient receives chronic opiates. He also takes low-dose benzodiazepines regularly. Plan to refer to chronic pain management.   No orders of the defined types were placed in this encounter.  Meds ordered this encounter  Medications  . predniSONE (STERAPRED UNI-PAK 48 TAB) 5 MG (48) TBPK tablet    Sig: 12 day dosepack po    Dispense:  48 tablet    Refill:  0     Discussed warning signs or symptoms. Please see discharge instructions. Patient expresses understanding.

## 2016-09-23 ENCOUNTER — Other Ambulatory Visit: Payer: Self-pay | Admitting: Family Medicine

## 2016-09-23 ENCOUNTER — Other Ambulatory Visit: Payer: Self-pay

## 2016-09-23 MED ORDER — ZOLPIDEM TARTRATE 10 MG PO TABS: 10.0000 mg | ORAL_TABLET | Freq: Every evening | ORAL | 2 refills | Status: DC | PRN

## 2016-10-14 ENCOUNTER — Other Ambulatory Visit: Payer: Self-pay | Admitting: Family Medicine

## 2016-10-15 ENCOUNTER — Telehealth: Payer: Self-pay | Admitting: Family Medicine

## 2016-10-15 DIAGNOSIS — I10 Essential (primary) hypertension: Secondary | ICD-10-CM

## 2016-10-15 DIAGNOSIS — N400 Enlarged prostate without lower urinary tract symptoms: Secondary | ICD-10-CM

## 2016-10-15 DIAGNOSIS — E785 Hyperlipidemia, unspecified: Secondary | ICD-10-CM

## 2016-10-15 DIAGNOSIS — Z8042 Family history of malignant neoplasm of prostate: Secondary | ICD-10-CM

## 2016-10-15 NOTE — Telephone Encounter (Signed)
Pt would like to come in on Monday Oct. 8th to get blood work before his physical on Oct. 111th. THanks eb

## 2016-10-15 NOTE — Telephone Encounter (Signed)
Lab orders placed and faxed.left message on patient's vm

## 2016-10-18 DIAGNOSIS — E785 Hyperlipidemia, unspecified: Secondary | ICD-10-CM | POA: Diagnosis not present

## 2016-10-18 DIAGNOSIS — N400 Enlarged prostate without lower urinary tract symptoms: Secondary | ICD-10-CM | POA: Diagnosis not present

## 2016-10-18 DIAGNOSIS — I10 Essential (primary) hypertension: Secondary | ICD-10-CM | POA: Diagnosis not present

## 2016-10-18 DIAGNOSIS — Z8042 Family history of malignant neoplasm of prostate: Secondary | ICD-10-CM | POA: Diagnosis not present

## 2016-10-18 LAB — COMPLETE METABOLIC PANEL WITH GFR
AG Ratio: 1.7 (calc) (ref 1.0–2.5)
ALT: 28 U/L (ref 9–46)
AST: 33 U/L (ref 10–35)
Albumin: 4 g/dL (ref 3.6–5.1)
Alkaline phosphatase (APISO): 71 U/L (ref 40–115)
BILIRUBIN TOTAL: 0.6 mg/dL (ref 0.2–1.2)
BUN: 14 mg/dL (ref 7–25)
CHLORIDE: 99 mmol/L (ref 98–110)
CO2: 28 mmol/L (ref 20–32)
Calcium: 9.4 mg/dL (ref 8.6–10.3)
Creat: 0.8 mg/dL (ref 0.70–1.25)
GFR, EST AFRICAN AMERICAN: 109 mL/min/{1.73_m2} (ref >=60)
GFR, Est Non African American: 94 mL/min/{1.73_m2} (ref >=60)
GLUCOSE: 95 mg/dL (ref 65–99)
Globulin: 2.4 g/dL (calc) (ref 1.9–3.7)
Potassium: 3.7 mmol/L (ref 3.5–5.3)
Sodium: 139 mmol/L (ref 135–146)
TOTAL PROTEIN: 6.4 g/dL (ref 6.1–8.1)

## 2016-10-18 LAB — LIPID PANEL W/REFLEX DIRECT LDL
CHOLESTEROL: 174 mg/dL (ref <200)
HDL: 53 mg/dL (ref >40)
Non-HDL Cholesterol (Calc): 121 mg/dL (calc) (ref <130)
TRIGLYCERIDES: 450 mg/dL — AB (ref <150)
Total CHOL/HDL Ratio: 3.3 (calc) (ref <5.0)

## 2016-10-18 LAB — CBC
HEMATOCRIT: 43.8 % (ref 38.5–50.0)
Hemoglobin: 15.1 g/dL (ref 13.2–17.1)
MCH: 32.4 pg (ref 27.0–33.0)
MCHC: 34.5 g/dL (ref 32.0–36.0)
MCV: 94 fL (ref 80.0–100.0)
MPV: 13 fL — AB (ref 7.5–12.5)
Platelets: 244 10*3/uL (ref 140–400)
RBC: 4.66 10*6/uL (ref 4.20–5.80)
RDW: 13.9 % (ref 11.0–15.0)
WBC: 7.4 10*3/uL (ref 3.8–10.8)

## 2016-10-18 LAB — PSA: PSA: 1.1 ng/mL (ref <=4.0)

## 2016-10-18 LAB — DIRECT LDL: Direct LDL: 63 mg/dL (ref <100)

## 2016-10-21 ENCOUNTER — Encounter: Payer: Medicare Other | Admitting: Family Medicine

## 2016-11-02 ENCOUNTER — Encounter: Payer: Self-pay | Admitting: *Deleted

## 2016-11-02 ENCOUNTER — Other Ambulatory Visit: Payer: Self-pay | Admitting: Family Medicine

## 2016-11-04 ENCOUNTER — Ambulatory Visit (INDEPENDENT_AMBULATORY_CARE_PROVIDER_SITE_OTHER): Payer: Medicare Other | Admitting: Family Medicine

## 2016-11-04 ENCOUNTER — Encounter: Payer: Self-pay | Admitting: Family Medicine

## 2016-11-04 VITALS — BP 125/83 | HR 112 | Ht 69.0 in | Wt 144.0 lb

## 2016-11-04 DIAGNOSIS — F1010 Alcohol abuse, uncomplicated: Secondary | ICD-10-CM | POA: Diagnosis not present

## 2016-11-04 DIAGNOSIS — Z0000 Encounter for general adult medical examination without abnormal findings: Secondary | ICD-10-CM | POA: Diagnosis not present

## 2016-11-04 DIAGNOSIS — F419 Anxiety disorder, unspecified: Secondary | ICD-10-CM | POA: Diagnosis not present

## 2016-11-04 DIAGNOSIS — Z23 Encounter for immunization: Secondary | ICD-10-CM

## 2016-11-04 MED ORDER — SERTRALINE HCL 50 MG PO TABS: ORAL_TABLET | ORAL | 3 refills | Status: DC

## 2016-11-04 NOTE — Patient Instructions (Signed)
Thank you for coming in today. Start zoloft 1/2 pill daily for 1 week.  Increase to 1 pill daily.  Recheck with me in 2-3 weeks.   Labs looked ok.  Try to work on reducing alcohol. .   Sertraline tablets What is this medicine? SERTRALINE (SER tra leen) is used to treat depression. It may also be used to treat obsessive compulsive disorder, panic disorder, post-trauma stress, premenstrual dysphoric disorder (PMDD) or social anxiety. This medicine may be used for other purposes; ask your health care provider or pharmacist if you have questions. COMMON BRAND NAME(S): Zoloft What should I tell my health care provider before I take this medicine? They need to know if you have any of these conditions: -bleeding disorders -bipolar disorder or a family history of bipolar disorder -glaucoma -heart disease -high blood pressure -history of irregular heartbeat -history of low levels of calcium, magnesium, or potassium in the blood -if you often drink alcohol -liver disease -receiving electroconvulsive therapy -seizures -suicidal thoughts, plans, or attempt; a previous suicide attempt by you or a family member -take medicines that treat or prevent blood clots -thyroid disease -an unusual or allergic reaction to sertraline, other medicines, foods, dyes, or preservatives -pregnant or trying to get pregnant -breast-feeding How should I use this medicine? Take this medicine by mouth with a glass of water. Follow the directions on the prescription label. You can take it with or without food. Take your medicine at regular intervals. Do not take your medicine more often than directed. Do not stop taking this medicine suddenly except upon the advice of your doctor. Stopping this medicine too quickly may cause serious side effects or your condition may worsen. A special MedGuide will be given to you by the pharmacist with each prescription and refill. Be sure to read this information carefully each  time. Talk to your pediatrician regarding the use of this medicine in children. While this drug may be prescribed for children as young as 7 years for selected conditions, precautions do apply. Overdosage: If you think you have taken too much of this medicine contact a poison control center or emergency room at once. NOTE: This medicine is only for you. Do not share this medicine with others. What if I miss a dose? If you miss a dose, take it as soon as you can. If it is almost time for your next dose, take only that dose. Do not take double or extra doses. What may interact with this medicine? Do not take this medicine with any of the following medications: -cisapride -dofetilide -dronedarone -linezolid -MAOIs like Carbex, Eldepryl, Marplan, Nardil, and Parnate -methylene blue (injected into a vein) -pimozide -thioridazine This medicine may also interact with the following medications: -alcohol -amphetamines -aspirin and aspirin-like medicines -certain medicines for depression, anxiety, or psychotic disturbances -certain medicines for fungal infections like ketoconazole, fluconazole, posaconazole, and itraconazole -certain medicines for irregular heart beat like flecainide, quinidine, propafenone -certain medicines for migraine headaches like almotriptan, eletriptan, frovatriptan, naratriptan, rizatriptan, sumatriptan, zolmitriptan -certain medicines for sleep -certain medicines for seizures like carbamazepine, valproic acid, phenytoin -certain medicines that treat or prevent blood clots like warfarin, enoxaparin, dalteparin -cimetidine -digoxin -diuretics -fentanyl -isoniazid -lithium -NSAIDs, medicines for pain and inflammation, like ibuprofen or naproxen -other medicines that prolong the QT interval (cause an abnormal heart rhythm) -rasagiline -safinamide -supplements like St. John's wort, kava kava, valerian -tolbutamide -tramadol -tryptophan This list may not describe  all possible interactions. Give your health care provider a list of all the medicines,  herbs, non-prescription drugs, or dietary supplements you use. Also tell them if you smoke, drink alcohol, or use illegal drugs. Some items may interact with your medicine. What should I watch for while using this medicine? Tell your doctor if your symptoms do not get better or if they get worse. Visit your doctor or health care professional for regular checks on your progress. Because it may take several weeks to see the full effects of this medicine, it is important to continue your treatment as prescribed by your doctor. Patients and their families should watch out for new or worsening thoughts of suicide or depression. Also watch out for sudden changes in feelings such as feeling anxious, agitated, panicky, irritable, hostile, aggressive, impulsive, severely restless, overly excited and hyperactive, or not being able to sleep. If this happens, especially at the beginning of treatment or after a change in dose, call your health care professional. Dennis Bast may get drowsy or dizzy. Do not drive, use machinery, or do anything that needs mental alertness until you know how this medicine affects you. Do not stand or sit up quickly, especially if you are an older patient. This reduces the risk of dizzy or fainting spells. Alcohol may interfere with the effect of this medicine. Avoid alcoholic drinks. Your mouth may get dry. Chewing sugarless gum or sucking hard candy, and drinking plenty of water may help. Contact your doctor if the problem does not go away or is severe. What side effects may I notice from receiving this medicine? Side effects that you should report to your doctor or health care professional as soon as possible: -allergic reactions like skin rash, itching or hives, swelling of the face, lips, or tongue -anxious -black, tarry stools -changes in vision -confusion -elevated mood, decreased need for sleep, racing  thoughts, impulsive behavior -eye pain -fast, irregular heartbeat -feeling faint or lightheaded, falls -feeling agitated, angry, or irritable -hallucination, loss of contact with reality -loss of balance or coordination -loss of memory -painful or prolonged erections -restlessness, pacing, inability to keep still -seizures -stiff muscles -suicidal thoughts or other mood changes -trouble sleeping -unusual bleeding or bruising -unusually weak or tired -vomiting Side effects that usually do not require medical attention (report to your doctor or health care professional if they continue or are bothersome): -change in appetite or weight -change in sex drive or performance -diarrhea -increased sweating -indigestion, nausea -tremors This list may not describe all possible side effects. Call your doctor for medical advice about side effects. You may report side effects to FDA at 1-800-FDA-1088. Where should I keep my medicine? Keep out of the reach of children. Store at room temperature between 15 and 30 degrees C (59 and 86 degrees F). Throw away any unused medicine after the expiration date. NOTE: This sheet is a summary. It may not cover all possible information. If you have questions about this medicine, talk to your doctor, pharmacist, or health care provider.  2018 [employer]vier/Gold Standard (2016-01-02 14:17:49)

## 2016-11-04 NOTE — Progress Notes (Signed)
HPI: Jimmy Mayo is a 66 y.o. male  who presents to [facility] today, 11/05/16,  for Medicare Annual Wellness Exam  Patient presents for annual physical/Medicare wellness exam.  [name] notes: Worsening pain overall. He notes worsening stress regarding his sick mother. He notes that he has been drinking more to deal with his stress. He thinks his health is worse overall than last year.    Past medical, surgical, social and family history reviewed:  Patient Active Problem List   Diagnosis Date Noted  . Chronic pain 09/03/2016  . Alcohol abuse 09/24/2015  . BPH (benign prostatic hyperplasia) 07/02/2015  . Chronic bronchitis ([place]) 05/23/2015  . Pigmented skin lesion 09/27/2012  . Dry eye 09/27/2012  . Personal history of colonic polyps 06/01/2012  . Abdominal pain, epigastric 06/01/2012  . Hyperlipidemia 02/16/2012  . Prediabetes 02/16/2012  . Seborrheic keratosis 02/16/2012  . Anxiety 02/15/2012  . History of gout 12/29/2011  . Depression 12/29/2011  . Family history of prostate cancer 12/29/2011  . Essential hypertension, benign 02/23/2010  . NEPHROLITHIASIS 10/15/2009  . MECHANICAL PTOSIS 12/09/2008  . NICOTINE ADDICTION 08/02/2007  . HEMATURIA UNSPECIFIED 08/02/2007  . [place] DISEASE, LUMBAR 01/04/2007  . Insomnia 01/04/2007    Past Surgical History:  Procedure Laterality Date  . COLONOSCOPY W/ BIOPSIES AND POLYPECTOMY     was to have 1 this past year but planing to see new GI in [place] realize he may need onne every 5 years  . HYDROCELE EXCISION / REPAIR     x3 or 2 on 1 and 1 on the other  . KNEE ARTHROSCOPY     1986  . LITHOTRIPSY    . NASAL SINUS SURGERY    . SPINAL FUSION    . SPINE SURGERY  2006   l spine fusion/rods  . VASECTOMY      Social History   Social History  . Marital status: Divorced    Spouse name: N/A  . Number of children: N/A  . Years of education: N/A   Occupational History  . Not on file.    Social History Main Topics  . Smoking status: Current Every Day Smoker    Packs/day: 1.50    Years: 35.00  . Smokeless tobacco: Never Used  . Alcohol use Yes  . Drug use: No  . Sexual activity: Not on file   Other Topics Concern  . Not on file   Social History Narrative  . No narrative on file    Family History  Problem Relation Age of Onset  . Cancer Mother        colon  . Cancer Father        colon, prostate  . Diabetes Father   . Diabetes Maternal Grandmother      Current medication list and allergy/intolerance information reviewed:    Outpatient Encounter Prescriptions as of 11/04/2016  Medication Sig Note  . allopurinol (ZYLOPRIM) 300 MG tablet Take 1 tablet (300 mg total) by mouth 2 (two) times daily.   [name] ALPRAZolam (XANAX) 0.5 MG tablet TAKE 1 TABLET BY MOUTH THREE TIMES DAILY AS NEEDED   . Ascorbic Acid (VITAMIN C PO) Take by mouth.   [name] CINNAMON PO Take by mouth.   . ECHINACEA PO Take by mouth.   . esomeprazole (NEXIUM) 40 MG capsule Take 40 mg by mouth daily at 12 noon.   . finasteride (PROSCAR) 5 MG tablet Take 1 tablet (5 mg total) by mouth daily.   [name]  Multiple Vitamin (MULTIVITAMIN) capsule Take 1 capsule by mouth daily.     [name] PROAIR HFA 108 (90 Base) MCG/ACT inhaler INHALE 2 PUFFS EVERY 6 HOURS AS NEEDED   . traMADol (ULTRAM) 50 MG tablet Take 50 mg by mouth. Take 1-2 tabs by mouth every 8 hours 07/18/2012: Pt states he takes 5 pills daily  . valsartan-hydrochlorothiazide (DIOVAN-HCT) 160-12.5 MG tablet Take 1 tablet by mouth daily.   [name] zolpidem (AMBIEN) 10 MG tablet Take 1 tablet (10 mg total) by mouth at bedtime as needed for sleep.   . [DISCONTINUED] DULoxetine (CYMBALTA) 20 MG capsule Take 1 capsule (20 mg total) by mouth daily.   . [DISCONTINUED] pantoprazole (PROTONIX) 40 MG tablet Take 1 tablet (40 mg total) by mouth daily.   . [DISCONTINUED] predniSONE (STERAPRED UNI-PAK 48 TAB) 5 MG (48) TBPK tablet 12 day dosepack po   . sertraline (ZOLOFT) 50 MG  tablet Take 25mg  daily for 1 week then increase it to 50mg  daily.    No facility-administered encounter medications on file as of 11/04/2016.     Allergies  Allergen Reactions  . Amoxicillin   . Niacin And Related     Flushing   . Penicillins   . Pravastatin Sodium     REACTION: diarrhea  . Zetia [Ezetimibe]     constipation       Review of Systems: No headache, visual changes, nausea, vomiting, diarrhea, constipation, dizziness, abdominal pain, skin rash, fevers, chills, night sweats, weight loss, swollen lymph nodes, body aches, joint swelling, muscle aches, chest pain, shortness of breath, mood changes, visual or auditory hallucinations.     Medicare Wellness Questionnaire  Are there smokers in your home (other than you)? no  Depression screen [facility] 2/9 11/04/2016 09/02/2016 10/21/2015 09/24/2015  Decreased Interest 1 1 1 1   Down, Depressed, Hopeless 2 - 1 1  PHQ - 2 Score 3 1 2 2   Altered sleeping 0 - 3 2  Tired, decreased energy 2 - 3 2  Change in appetite 1 - 3 2  Feeling bad or failure about yourself  1 - 2 2  Trouble concentrating 1 - 1 2  Moving slowly or fidgety/restless 1 - 1 2  Suicidal thoughts 0 - 0 0  PHQ-9 Score 9 - 15 14  Difficult doing work/chores - - Somewhat difficult Somewhat difficult        Activities of Daily Living In your present state of health, do you have any difficulty performing the following activities?:  Driving? no Managing money?  yes Feeding yourself? no Getting from bed to chair? yes Climbing a flight of stairs? no Preparing food and eating?: no Bathing or showering? no Getting dressed: no Getting to the toilet? no Using the toilet: no Moving around from place to place: yes In the past year have you fallen or had a near fall?: yes  Hearing Difficulties:  Do you often ask people to speak up or repeat themselves? yes Do you experience ringing or noises in your ears? yes  Do you have difficulty understanding soft or  whispered voices? yes  Memory Difficulties:  Do you feel that you have a problem with memory? yes  Do you often misplace items? yes  Do you feel safe at home?  yes  Sexual Health:   Are you sexually active?  no  Do you have more than one partner?  No   Risk Factors  Current exercise habits: none  Dietary issues discussedrs::yes  Cardiac risk factors: present  Exam:  BP 125/83   Pulse (!) 112   Ht 5\' 9"  (1.753 m)   Wt 144 lb (65.3 kg)   BMI 21.27 kg/m  Vision by Snellen chart: right eye:see nurse notes, left eye:see nurse notes  Constitutional: VS see above. General Appearance: alert, well-developed, well-nourished, NAD  Ears, Nose, Mouth, Throat: MMM  Neck: No masses, trachea midline.   Respiratory: Normal respiratory effort. no wheeze, no rhonchi, no rales  Cardiovascular:No lower extremity edema.   Musculoskeletal: Gait normal. No clubbing/cyanosis of digits.   Neurological: Normal balance/coordination. No tremor. Recalls 3 objects and able to read face of watch with correct time.   Skin: warm, dry, inta ract. Nosh/ulcer.   Psychiatric: Normal judgment/insight. Normal mood and affect. Oriented x3.     Chemistry      Component Value Date/Time   NA 139 10/18/2016 1049   K 3.7 10/18/2016 1049   CL 99 10/18/2016 1049   CO2 28 10/18/2016 1049   BUN 14 10/18/2016 1049   CREATININE 0.80 10/18/2016 1049      Component Value Date/Time   CALCIUM 9.4 10/18/2016 1049   ALKPHOS 66 10/14/2015 1111   AST 33 10/18/2016 1049   ALT 28 10/18/2016 1049   BILITOT 0.6 10/18/2016 1049     Lab Results  Component Value Date   CHOL 174 10/18/2016   HDL 53 10/18/2016   LDLCALC 96 11/19/2015   LDLDIRECT 63 10/18/2016   TRIG 450 (H) 10/18/2016   CHOLHDL 3.3 10/18/2016     ASSESSMENT/PLAN:   Encounter for Medicare annual wellness exam  Kalep's main issue today is depression and alcohol use.  Plan to start Zoloft taper and discussed alcohol reduction.  Labs recently  were pretty well controlled with the exception of elevated triglyceride likely due to alcohol.  Recheck in 2-3 weeks.  High-dose influenza vaccine given today.  Encounter for immunization - Plan: Flu vaccine HIGH DOSE PF  Health Maintenance Health Maintenance  Topic Date Due  . PNA vac Low Risk Adult (1 of 2 - PCV13) 07/26/2016  . TETANUS/TDAP  08/01/2017  . COLONOSCOPY  06/01/2022  . INFLUENZA VACCINE  Completed  . Hepatitis C Screening  Completed  . HIV Screening  Completed    Immunization History  Administered Date(s) Administered  . H1N1 12/26/2007  . Influenza Split 09/29/2010, 10/21/2011  . Influenza Whole 01/04/2007, 12/09/2008, 10/15/2009  . Influenza, High Dose Seasonal PF 11/04/2016  . Influenza,inj,Quad PF,6+ Mos 09/27/2012, 10/18/2013, 12/11/2014, 09/23/2015  . Pneumococcal Polysaccharide-23 12/29/2011  . Td 08/02/2007  . Zoster 01/07/2012     During the course of the visit the patient was educated and counseled about appropriate screening and preventive services as noted above.   Patient Instructions (the written plan) was given to the patient.  Medicare Attestation I have personally reviewed: The patient's medical and social history Their use of alcohol, tobacco or illicit drugs Their current medications and supplements The patient's functional ability including ADLs,fall risks, home safety risks, cognitive, and hearing and visual impairment Diet and physical activities Evidence for depression or mood disorders  The patient's weight, height, BMI, and visual acuity have been recorded in the chart.  I have made referrals, counseling, and provided education to the patient based on review of the above and I have provided the patient with a written personalized care plan for preventive services.

## 2016-11-16 DIAGNOSIS — H02831 Dermatochalasis of right upper eyelid: Secondary | ICD-10-CM | POA: Diagnosis not present

## 2016-11-16 DIAGNOSIS — H02834 Dermatochalasis of left upper eyelid: Secondary | ICD-10-CM | POA: Diagnosis not present

## 2016-11-16 DIAGNOSIS — H527 Unspecified disorder of refraction: Secondary | ICD-10-CM | POA: Diagnosis not present

## 2016-11-16 DIAGNOSIS — H40003 Preglaucoma, unspecified, bilateral: Secondary | ICD-10-CM | POA: Diagnosis not present

## 2016-11-16 DIAGNOSIS — H35373 Puckering of macula, bilateral: Secondary | ICD-10-CM | POA: Diagnosis not present

## 2016-11-16 DIAGNOSIS — H43811 Vitreous degeneration, right eye: Secondary | ICD-10-CM | POA: Diagnosis not present

## 2016-11-22 ENCOUNTER — Other Ambulatory Visit: Payer: Self-pay | Admitting: Family Medicine

## 2016-11-23 ENCOUNTER — Ambulatory Visit: Payer: Medicare Other | Admitting: Family Medicine

## 2016-11-29 ENCOUNTER — Other Ambulatory Visit: Payer: Self-pay | Admitting: Family Medicine

## 2016-11-29 NOTE — Telephone Encounter (Signed)
Rx request for Alprazolam 0.5 mg #90 TID  Last refilled 10/15/2016 Last OV 11/04/2016 Next OV: none Please advise

## 2016-12-07 ENCOUNTER — Ambulatory Visit (INDEPENDENT_AMBULATORY_CARE_PROVIDER_SITE_OTHER): Payer: Medicare Other | Admitting: Family Medicine

## 2016-12-07 ENCOUNTER — Encounter: Payer: Self-pay | Admitting: Family Medicine

## 2016-12-07 VITALS — BP 138/83 | HR 86 | Ht 69.0 in | Wt 149.0 lb

## 2016-12-07 DIAGNOSIS — F419 Anxiety disorder, unspecified: Secondary | ICD-10-CM

## 2016-12-07 DIAGNOSIS — F1010 Alcohol abuse, uncomplicated: Secondary | ICD-10-CM | POA: Diagnosis not present

## 2016-12-07 DIAGNOSIS — M7581 Other shoulder lesions, right shoulder: Secondary | ICD-10-CM

## 2016-12-07 MED ORDER — FLUOXETINE HCL 10 MG PO CAPS: 10.0000 mg | ORAL_CAPSULE | Freq: Every day | ORAL | 1 refills | Status: DC

## 2016-12-07 NOTE — Patient Instructions (Signed)
Thank you for coming in today. Call or go to the ER if you develop a large red swollen joint with extreme pain or oozing puss.  Start Prozac.  Recheck in 1 month.  Return sooner if needed.  Let me know if the prozac is not working.  You should hear about pain management soon.  Let me know if you do not hear anything.

## 2016-12-07 NOTE — Progress Notes (Signed)
Jimmy Mayo is a 66 y.o. male who presents to [place]: [place] today for anxiety/depression, alcohol, and right shoulder pain.  Anxiety/depression: Jimmy Mayo has been seen several times for mood issues. These are primarily related to his living situation. His house is being renovated and he was living in hotels and trying to care for his sick mother. In the last month he has returned to his own home and things stabilize. He notes this has helped quite a bit. He notes that he was unable to tolerate Zoloft and stopped taking it. He still feels anxiety and depression symptoms and thinks he can do better than he currently is doing.  Alcohol abuse: Jimmy Mayo has successfully cut back a bit on drinking butstill drinking more than he thinks he should. He wants to continue trying to cut back. He denies any withdrawal symptoms when he stops drinking for a few days.  Right Shoulder Pain: Jimmy Mayo notes pain in his right shoulder ongoing for several months. He denies any new injury. His pain is present in the lateral upper arm is worse with overhead motion reaching back. He denies any radiating pain weakness or numbness.   Past Medical History:  Diagnosis Date  . Anemia   . Anxiety   . Asthma   . Back pain   . Chronic kidney disease   . GERD (gastroesophageal reflux disease)   . Kidney stone    Past Surgical History:  Procedure Laterality Date  . COLONOSCOPY W/ BIOPSIES AND POLYPECTOMY     was to have 1 this past year but planing to see new GI in [place] realize he may need onne every 5 years  . HYDROCELE EXCISION / REPAIR     x3 or 2 on 1 and 1 on the other  . KNEE ARTHROSCOPY     1986  . LITHOTRIPSY    . NASAL SINUS SURGERY    . SPINAL FUSION    . SPINE SURGERY  2006   l spine fusion/rods  . VASECTOMY     Social History   Tobacco Use  . Smoking status: Current Every Day Smoker   Packs/day: 1.50    Years: 35.00    Pack years: 52.50  . Smokeless tobacco: Never Used  Substance Use Topics  . Alcohol use: Yes   family history includes Cancer in his father and mother; Diabetes in his father and maternal grandmother.  ROS as above:  Medications: Current Outpatient Medications  Medication Sig Dispense Refill  . allopurinol (ZYLOPRIM) 300 MG tablet Take 1 tablet (300 mg total) by mouth 2 (two) times daily. 180 tablet 0  . ALPRAZolam (XANAX) 0.5 MG tablet TAKE 1 TABLET THREE TIMES DAILY AS NEEDED 90 tablet 3  . Ascorbic Acid (VITAMIN C PO) Take by mouth.    [name] CINNAMON PO Take by mouth.    . ECHINACEA PO Take by mouth.    . esomeprazole (NEXIUM) 40 MG capsule Take 40 mg by mouth daily at 12 noon.    . finasteride (PROSCAR) 5 MG tablet Take 1 tablet (5 mg total) by mouth daily. 90 tablet 1  . Multiple Vitamin (MULTIVITAMIN) capsule Take 1 capsule by mouth daily.      [name] PROAIR HFA 108 (90 Base) MCG/ACT inhaler INHALE 2 PUFFS EVERY 6 HOURS AS NEEDED 17 g 2  . traMADol (ULTRAM) 50 MG tablet Take 50 mg by mouth. Take 1-2 tabs by mouth every 8 hours    .  valsartan-hydrochlorothiazide (DIOVAN-HCT) 160-12.5 MG tablet Take 1 tablet by mouth daily. 90 tablet 0  . zolpidem (AMBIEN) 10 MG tablet TAKE 1 TABLET DAILY AT BEDTIME AS NEEDED FOR SLEEP NEED OFFICE VISIT BEFORE ANY MORE REFILLS 30 tablet 3  . FLUoxetine (PROZAC) 10 MG capsule Take 1 capsule (10 mg total) by mouth daily. 30 capsule 1   No current facility-administered medications for this visit.    Allergies  Allergen Reactions  . Amoxicillin   . Niacin And Related     Flushing   . Penicillins   . Pravastatin Sodium     REACTION: diarrhea  . Zetia [Ezetimibe]     constipation    Health Maintenance Health Maintenance  Topic Date Due  . PNA vac Low Risk Adult (1 of 2 - PCV13) 07/26/2016  . TETANUS/TDAP  08/01/2017  . COLONOSCOPY  06/01/2022  . INFLUENZA VACCINE  Completed  . Hepatitis C Screening  Completed    . HIV Screening  Completed     Exam:  BP 138/83   Pulse 86   Ht 5\' 9"  (1.753 m)   Wt 149 lb (67.6 kg)   BMI 22.00 kg/m  Gen: Well NAD HEENT: EOMI,  MMM Lungs: Normal work of breathing. CTABL Heart: RRR no MRG Abd: NABS, Soft. Nondistended, Nontender Exts: Brisk capillary refill, warm and well perfused.  Psych alert and oriented normal speech thought process and affect. No SI or HI. MSK: Right shoulder normal-appearing Nontender. Range of motion limited in abduction and internal rotation by pain. Positive [place], Neers test and empty can test. Strength is intact.  Procedure: Real-time Ultrasound Guided Injection of right subacromial bursa  Device: GE Logiq E  Images permanently stored and available for review in the ultrasound unit. Verbal informed consent obtained. Discussed risks and benefits of procedure. Warned about infection bleeding damage to structures skin hypopigmentation and fat atrophy among others. Patient expresses understanding and agreement Time-out conducted.  Noted no overlying erythema, induration, or other signs of local infection.  Skin prepped in a sterile fashion.  Local anesthesia: Topical Ethyl chloride.  With sterile technique and under real time ultrasound guidance: 40mg  kenalog and 98ml marcaine injected easily.  Completed without difficulty  Pain immediately resolved suggesting accurate placement of the medication.  Advised to call if fevers/chills, erythema, induration, drainage, or persistent bleeding.  Images permanently stored and available for review in the ultrasound unit.  Impression: Technically successful ultrasound guided injection.      No results found for this or any previous visit (from the past 72 hour(s)). No results found.    Assessment and Plan: 65 y.o. male with  Right shoulder pain likely subacromial bursitis or rotator cuff tendinitis. Status post injection today. Continue home exercise  program.  Anxiety and depression improved but not at goal. Plan to switch to fluoxetine and recheck in one month.  Alcohol use: Improving but not at goal. Continue to work on reducing alcohol if he is unable to will consider abstinence programs like Alcoholics Anonymous. Reassess in one month.   No orders of the defined types were placed in this encounter.  Meds ordered this encounter  Medications  . FLUoxetine (PROZAC) 10 MG capsule    Sig: Take 1 capsule (10 mg total) by mouth daily.    Dispense:  30 capsule    Refill:  1     Discussed warning signs or symptoms. Please see discharge instructions. Patient expresses understanding.

## 2016-12-09 ENCOUNTER — Telehealth: Payer: Self-pay | Admitting: Family Medicine

## 2016-12-09 DIAGNOSIS — G894 Chronic pain syndrome: Secondary | ICD-10-CM

## 2016-12-09 NOTE — Telephone Encounter (Signed)
Pain medicine referral

## 2016-12-13 ENCOUNTER — Encounter: Payer: Self-pay | Admitting: Family Medicine

## 2016-12-13 ENCOUNTER — Ambulatory Visit (INDEPENDENT_AMBULATORY_CARE_PROVIDER_SITE_OTHER): Payer: Medicare Other | Admitting: Family Medicine

## 2016-12-13 VITALS — BP 139/89 | HR 87 | Ht 69.0 in | Wt 150.0 lb

## 2016-12-13 DIAGNOSIS — N200 Calculus of kidney: Secondary | ICD-10-CM

## 2016-12-13 DIAGNOSIS — R300 Dysuria: Secondary | ICD-10-CM

## 2016-12-13 DIAGNOSIS — R319 Hematuria, unspecified: Secondary | ICD-10-CM

## 2016-12-13 LAB — BASIC METABOLIC PANEL WITH GFR
BUN: 25 mg/dL (ref 7–25)
CALCIUM: 10 mg/dL (ref 8.6–10.3)
CO2: 30 mmol/L (ref 20–32)
CREATININE: 0.93 mg/dL (ref 0.70–1.25)
Chloride: 101 mmol/L (ref 98–110)
GFR, EST NON AFRICAN AMERICAN: 86 mL/min/{1.73_m2} (ref >=60)
GFR, Est African American: 99 mL/min/{1.73_m2} (ref >=60)
Glucose, Bld: 89 mg/dL (ref 65–99)
Potassium: 4.6 mmol/L (ref 3.5–5.3)
Sodium: 140 mmol/L (ref 135–146)

## 2016-12-13 LAB — POCT URINALYSIS DIPSTICK
BILIRUBIN UA: NEGATIVE
GLUCOSE UA: NEGATIVE
Ketones, UA: NEGATIVE
LEUKOCYTES UA: NEGATIVE
NITRITE UA: NEGATIVE
Protein, UA: NEGATIVE
RBC UA: NEGATIVE
Spec Grav, UA: 1.02 (ref 1.010–1.025)
Urobilinogen, UA: 0.2 E.U./dL
pH, UA: 6.5 (ref 5.0–8.0)

## 2016-12-13 MED ORDER — TAMSULOSIN HCL 0.4 MG PO CAPS: 0.4000 mg | ORAL_CAPSULE | Freq: Every day | ORAL | 3 refills | Status: DC

## 2016-12-13 NOTE — Patient Instructions (Addendum)
Thank you for coming in today. Take flomax as needed for urine.  Get labs today.  Recheck as needed.  Let me know if this recurs.  We will refer to urology.    Kidney Stones Kidney stones (urolithiasis) are rock-like masses that form inside of the kidneys. Kidneys are organs that make pee (urine). A kidney stone can cause very bad pain and can block the flow of pee. The stone usually leaves your body (passes) through your pee. You may need to have a doctor take out the stone. Follow these instructions at home: Eating and drinking  Drink enough fluid to keep your pee clear or pale yellow. This will help you pass the stone.  If told by your doctor, change the foods you eat (your diet). This may include: ? Limiting how much salt (sodium) you eat. ? Eating more fruits and vegetables. ? Limiting how much meat, poultry, fish, and eggs you eat.  Follow instructions from your doctor about eating or drinking restrictions. General instructions  Collect pee samples as told by your doctor. You may need to collect a pee sample: ? 24 hours after a stone comes out. ? 8-12 weeks after a stone comes out, and every 6-12 months after that.  Strain your pee every time you pee (urinate), for as long as told. Use the strainer that your doctor recommends.  Do not throw out the stone. Keep it so that it can be tested by your doctor.  Take over-the-counter and prescription medicines only as told by your doctor.  Keep all follow-up visits as told by your doctor. This is important. You may need follow-up tests. Preventing kidney stones To prevent another kidney stone:  Drink enough fluid to keep your pee clear or pale yellow. This is the best way to prevent kidney stones.  Eat healthy foods.  Avoid certain foods as told by your doctor. You may be told to eat less protein.  Stay at a healthy weight.  Contact a doctor if:  You have pain that gets worse or does not get better with medicine. Get  help right away if:  You have a fever or chills.  You get very bad pain.  You get new pain in your belly (abdomen).  You pass out (faint).  You cannot pee. This information is not intended to replace advice given to you by your health care provider. Make sure you discuss any questions you have with your health care provider. Document Released: 06/16/2007 Document Revised: 09/16/2015 Document Reviewed: 09/16/2015 Elsevier Interactive Patient Education  2017 [employer].

## 2016-12-13 NOTE — Progress Notes (Signed)
Jimmy Mayo is a 66 y.o. male who presents to [place]: [place] today for Hematuria. [name] developed blood in his urine 2 days ago. The blood was associated with urinary frequency and pain. He has a history of kidney stones and his pain was consistent with kidney stones. He is feeling much better today and is essentially asymptomatic. He denies fevers chills vomiting or diarrhea.   Past Medical History:  Diagnosis Date  . Anemia   . Anxiety   . Asthma   . Back pain   . Chronic kidney disease   . GERD (gastroesophageal reflux disease)   . Kidney stone    Past Surgical History:  Procedure Laterality Date  . COLONOSCOPY W/ BIOPSIES AND POLYPECTOMY     was to have 1 this past year but planing to see new GI in [place] realize he may need onne every 5 years  . HYDROCELE EXCISION / REPAIR     x3 or 2 on 1 and 1 on the other  . KNEE ARTHROSCOPY     1986  . LITHOTRIPSY    . NASAL SINUS SURGERY    . SPINAL FUSION    . SPINE SURGERY  2006   l spine fusion/rods  . VASECTOMY     Social History   Tobacco Use  . Smoking status: Current Every Day Smoker    Packs/day: 1.50    Years: 35.00    Pack years: 52.50  . Smokeless tobacco: Never Used  Substance Use Topics  . Alcohol use: Yes   family history includes Cancer in his father and mother; Diabetes in his father and maternal grandmother.  ROS as above:  Medications: Current Outpatient Medications  Medication Sig Dispense Refill  . allopurinol (ZYLOPRIM) 300 MG tablet Take 1 tablet (300 mg total) by mouth 2 (two) times daily. 180 tablet 0  . ALPRAZolam (XANAX) 0.5 MG tablet TAKE 1 TABLET THREE TIMES DAILY AS NEEDED 90 tablet 3  . Ascorbic Acid (VITAMIN C PO) Take by mouth.    [name] CINNAMON PO Take by mouth.    . ECHINACEA PO Take by mouth.    . esomeprazole (NEXIUM) 40 MG capsule Take 40 mg by mouth daily at 12  noon.    . finasteride (PROSCAR) 5 MG tablet Take 1 tablet (5 mg total) by mouth daily. 90 tablet 1  . FLUoxetine (PROZAC) 10 MG capsule Take 1 capsule (10 mg total) by mouth daily. 30 capsule 1  . Multiple Vitamin (MULTIVITAMIN) capsule Take 1 capsule by mouth daily.      [name] PROAIR HFA 108 (90 Base) MCG/ACT inhaler INHALE 2 PUFFS EVERY 6 HOURS AS NEEDED 17 g 2  . traMADol (ULTRAM) 50 MG tablet Take 50 mg by mouth. Take 1-2 tabs by mouth every 8 hours    . valsartan-hydrochlorothiazide (DIOVAN-HCT) 160-12.5 MG tablet Take 1 tablet by mouth daily. 90 tablet 0  . zolpidem (AMBIEN) 10 MG tablet TAKE 1 TABLET DAILY AT BEDTIME AS NEEDED FOR SLEEP NEED OFFICE VISIT BEFORE ANY MORE REFILLS 30 tablet 3  . tamsulosin (FLOMAX) 0.4 MG CAPS capsule Take 1 capsule (0.4 mg total) by mouth daily after breakfast. 30 capsule 3   No current facility-administered medications for this visit.    Allergies  Allergen Reactions  . Amoxicillin   . Niacin And Related     Flushing   . Penicillins   . Pravastatin Sodium     REACTION: diarrhea  .  Zetia [Ezetimibe]     constipation    Health Maintenance Health Maintenance  Topic Date Due  . PNA vac Low Risk Adult (1 of 2 - PCV13) 07/26/2016  . TETANUS/TDAP  08/01/2017  . COLONOSCOPY  06/01/2022  . INFLUENZA VACCINE  Completed  . Hepatitis C Screening  Completed  . HIV Screening  Completed     Exam:  BP 139/89   Pulse 87   Ht 5\' 9"  (1.753 m)   Wt 150 lb (68 kg)   BMI 22.15 kg/m  Gen: Well NAD HEENT: EOMI,  MMM Lungs: Normal work of breathing. CTABL Heart: RRR no MRG Abd: NABS, Soft. Nondistended, Nontender no CVA angle tenderness to percussion Exts: Brisk capillary refill, warm and well perfused.    Results for orders placed or performed in visit on 12/13/16 (from the past 72 hour(s))  POCT Urinalysis Dipstick     Status: None   Collection Time: 12/13/16  2:30 PM  Result Value Ref Range   Color, UA yellow    Clarity, UA clear    Glucose, UA  negative    Bilirubin, UA negative    Ketones, UA negative    Spec Grav, UA 1.020 1.010 - 1.025   Blood, UA negative    pH, UA 6.5 5.0 - 8.0   Protein, UA negative    Urobilinogen, UA 0.2 0.2 or 1.0 E.U./dL   Nitrite, UA negative    Leukocytes, UA Negative Negative   No results found.    Assessment and Plan: 66 y.o. male with hematuria with a history of kidney stones very likely passed a kidney stone. Urinalysis unremarkable. Metabolic panel pending. Trial of Flomax as needed.   Orders Placed This Encounter  Procedures  . BASIC METABOLIC PANEL WITH GFR  . POCT Urinalysis Dipstick   Meds ordered this encounter  Medications  . tamsulosin (FLOMAX) 0.4 MG CAPS capsule    Sig: Take 1 capsule (0.4 mg total) by mouth daily after breakfast.    Dispense:  30 capsule    Refill:  3     Discussed warning signs or symptoms. Please see discharge instructions. Patient expresses understanding.

## 2016-12-24 ENCOUNTER — Other Ambulatory Visit: Payer: Self-pay | Admitting: Family Medicine

## 2016-12-28 ENCOUNTER — Other Ambulatory Visit: Payer: Self-pay | Admitting: Family Medicine

## 2017-01-10 ENCOUNTER — Ambulatory Visit: Payer: Medicare Other | Admitting: Family Medicine

## 2017-01-21 ENCOUNTER — Other Ambulatory Visit: Payer: Self-pay | Admitting: Family Medicine

## 2017-01-25 ENCOUNTER — Other Ambulatory Visit: Payer: Self-pay

## 2017-01-25 NOTE — Telephone Encounter (Signed)
Patient request refill for Ambien 10 mg. Patient stated that he will make an appointment to follow up at some point his mom passed away and he is dealing with that. Please advise. Kuzey Ogata,CMA

## 2017-01-26 MED ORDER — ZOLPIDEM TARTRATE 10 MG PO TABS: ORAL_TABLET | ORAL | 5 refills | Status: DC

## 2017-02-02 ENCOUNTER — Other Ambulatory Visit: Payer: Self-pay | Admitting: Family Medicine

## 2017-02-11 ENCOUNTER — Telehealth: Payer: Self-pay | Admitting: Family Medicine

## 2017-02-11 MED ORDER — ALBUTEROL SULFATE HFA 108 (90 BASE) MCG/ACT IN AERS: 2.0000 | INHALATION_SPRAY | Freq: Four times a day (QID) | RESPIRATORY_TRACT | 2 refills | Status: DC | PRN

## 2017-02-11 NOTE — Telephone Encounter (Signed)
Patient stated that his medication was not affected because they use a different vendor. Rhonda Cunningham,CMA

## 2017-02-11 NOTE — Telephone Encounter (Signed)
The Valsartan/HCTZ blood pressure pill may have been involved in a recall. Please contact you pharmacy and let me know if we need to change it.

## 2017-02-11 NOTE — Telephone Encounter (Signed)
Left message on patient vm to call the office back and let us know if his Valsartan was on recall. Rhonda Cunningham,CMA

## 2017-03-08 ENCOUNTER — Ambulatory Visit (INDEPENDENT_AMBULATORY_CARE_PROVIDER_SITE_OTHER): Payer: Medicare Other | Admitting: Family Medicine

## 2017-03-08 ENCOUNTER — Encounter: Payer: Self-pay | Admitting: Family Medicine

## 2017-03-08 VITALS — BP 135/83 | HR 102 | Ht 69.0 in | Wt 147.0 lb

## 2017-03-08 DIAGNOSIS — Z87442 Personal history of urinary calculi: Secondary | ICD-10-CM | POA: Diagnosis not present

## 2017-03-08 DIAGNOSIS — Z8042 Family history of malignant neoplasm of prostate: Secondary | ICD-10-CM

## 2017-03-08 DIAGNOSIS — N401 Enlarged prostate with lower urinary tract symptoms: Secondary | ICD-10-CM | POA: Diagnosis not present

## 2017-03-08 DIAGNOSIS — R350 Frequency of micturition: Secondary | ICD-10-CM

## 2017-03-08 DIAGNOSIS — Z23 Encounter for immunization: Secondary | ICD-10-CM | POA: Diagnosis not present

## 2017-03-08 DIAGNOSIS — M7581 Other shoulder lesions, right shoulder: Secondary | ICD-10-CM

## 2017-03-08 LAB — POCT URINALYSIS DIPSTICK
Bilirubin, UA: NEGATIVE
Glucose, UA: NEGATIVE
LEUKOCYTES UA: NEGATIVE
NITRITE UA: NEGATIVE
PROTEIN UA: NEGATIVE
RBC UA: NEGATIVE
Spec Grav, UA: >=1.03 — AB (ref 1.010–1.025)
Urobilinogen, UA: 0.2 E.U./dL
pH, UA: 5.5 (ref 5.0–8.0)

## 2017-03-08 NOTE — Progress Notes (Signed)
Jimmy Mayo is a 67 y.o. male who presents to [name]: [place] today for right shoulder pain, BPH, colon cancer screening.   Right Shoulder Pain: Jimmy Mayo continued right shoulder pain.  Jimmy Mayo was seen for this problem in November 2018 where Jimmy Mayo was thought to have rotator cuff tendinitis.  Jimmy Mayo received a subacromial bursa injection which did not provide much relief after the shot and only lasted for a few weeks.  Jimmy Mayo Mayo the pain is continuous and present in the lateral upper arm worse with overhead motion reaching back.  The pain is especially bothersome at night.  Jimmy Mayo denies any radiating pain weakness or numbness.  Jimmy Mayo denies any injury.  No fevers or chills.  Jimmy Mayo is tried some over-the-counter medications which have only been mildly helpful.  Additionally Jimmy Mayo has a history of several urologic issues.  Jimmy Mayo has a history of BPH which has been previously controlled with finasteride.  Jimmy Mayo had a kidney stone in early December and was started on tamsulosin.  Jimmy Mayo Mayo that this significantly improved his urinary symptoms.  Jimmy Mayo Mayo significantly less nocturia.  Jimmy Mayo does however note retrograde ejaculation finds to be surprising and somewhat annoying.  Jimmy Mayo is interested in following up with a urologist for routine urologic care in the future for his kidney stones and BPH.  Jimmy Mayo denies any further kidney stones since December  Colon cancer screening: Jimmy Mayo that Jimmy Mayo is due for recheck colonoscopy and upper endoscopy colon cancer screening via gastroenterology.  Jimmy Mayo plans on calling his gastroneurologist office but cannot member the name.  Jimmy Mayo is feeling pretty well with no significant abdominal pain.   Past Medical History:  Diagnosis Date  . Anemia   . Anxiety   . Asthma   . Back pain   . Chronic kidney disease   . GERD (gastroesophageal reflux disease)   . Kidney stone    Past Surgical  History:  Procedure Laterality Date  . COLONOSCOPY W/ BIOPSIES AND POLYPECTOMY     was to have 1 this past year but planing to see new GI in [place] realize Jimmy Mayo may need onne every 5 years  . HYDROCELE EXCISION / REPAIR     x3 or 2 on 1 and 1 on the other  . KNEE ARTHROSCOPY     1986  . LITHOTRIPSY    . NASAL SINUS SURGERY    . SPINAL FUSION    . SPINE SURGERY  2006   l spine fusion/rods  . VASECTOMY     Social History   Tobacco Use  . Smoking status: Current Every Day Smoker    Packs/day: 1.50    Years: 35.00    Pack years: 52.50  . Smokeless tobacco: Never Used  Substance Use Topics  . Alcohol use: Yes   family history includes Cancer in his father and mother; Diabetes in his father and maternal grandmother.  ROS as above:  Medications: Current Outpatient Medications  Medication Sig Dispense Refill  . albuterol (PROAIR HFA) 108 (90 Base) MCG/ACT inhaler Inhale 2 puffs into the lungs every 6 (six) hours as needed. 17 g 2  . allopurinol (ZYLOPRIM) 300 MG tablet Take 1 tablet (300 mg total) by mouth 2 (two) times daily. 180 tablet 0  . ALPRAZolam (XANAX) 0.5 MG tablet TAKE 1 TABLET THREE TIMES DAILY AS NEEDED 90 tablet 3  . Ascorbic Acid (VITAMIN C PO) Take by mouth.    [name] CINNAMON  PO Take by mouth.    . ECHINACEA PO Take by mouth.    . esomeprazole (NEXIUM) 40 MG capsule Take 40 mg by mouth daily at 12 noon.    . finasteride (PROSCAR) 5 MG tablet Take 1 tablet (5 mg total) by mouth daily. 90 tablet 1  . FLUoxetine (PROZAC) 10 MG capsule Take 1 capsule (10 mg total) by mouth daily. 30 capsule 1  . Multiple Vitamin (MULTIVITAMIN) capsule Take 1 capsule by mouth daily.      . tamsulosin (FLOMAX) 0.4 MG CAPS capsule Take 1 capsule (0.4 mg total) by mouth daily after breakfast. 30 capsule 3  . traMADol (ULTRAM) 50 MG tablet Take 50 mg by mouth. Take 1-2 tabs by mouth every 8 hours    . valsartan-hydrochlorothiazide (DIOVAN-HCT) 160-12.5 MG tablet Take 1 tablet by mouth  daily. 90 tablet 0  . zolpidem (AMBIEN) 10 MG tablet TAKE 1 TABLET DAILY AT BEDTIME AS NEEDED FOR SLEEP NEED OFFICE VISIT BEFORE ANY MORE REFILLS 30 tablet 5   No current facility-administered medications for this visit.    Allergies  Allergen Reactions  . Amoxicillin   . Niacin And Related     Flushing   . Penicillins   . Pravastatin Sodium     REACTION: diarrhea  . Zetia [Ezetimibe]     constipation    Health Maintenance Health Maintenance  Topic Date Due  . PNA vac Low Risk Adult (1 of 2 - PCV13) 07/26/2016  . TETANUS/TDAP  08/01/2017  . COLONOSCOPY  06/01/2022  . INFLUENZA VACCINE  Completed  . Hepatitis C Screening  Completed  . HIV Screening  Completed     Exam:  BP 135/83   Pulse (!) 102   Ht 5\' 9"  (1.753 m)   Wt 147 lb (66.7 kg)   BMI 21.71 kg/m  Gen: Well NAD HEENT: EOMI,  MMM Lungs: Normal work of breathing. CTABL Heart: RRR no MRG heart rate 80 bpm per my check Abd: NABS, Soft. Nondistended, Nontender Exts: Brisk capillary refill, warm and well perfused.  Right shoulder normal-appearing Range of motion: Abduction full but significant pain with abduction arc from 90 degrees to 120 degrees Forward flexion full but painful above the level of the shoulder External rotation full and normal Internal rotation to the iliac crest  contralateral left shoulder range of motion is normal Positive Hawkins and Neer's test. Intact strength.  Procedure: Real-time Ultrasound Guided Injection of right glenohumeral joint Device: GE Logiq E   Images permanently stored and available for review in the ultrasound unit. Verbal informed consent obtained.  Discussed risks and benefits of procedure. Warned about infection bleeding damage to structures skin hypopigmentation and fat atrophy among others. Patient expresses understanding and agreement Time-out conducted.   Noted no overlying erythema, induration, or other signs of local infection.   Skin prepped in a sterile  fashion.   Local anesthesia: Topical Ethyl chloride.   With sterile technique and under real time ultrasound guidance:  40 mg of Kenalog and 2 mL of Marcaine injected easily.   Completed without difficulty   Pain immediately resolved suggesting accurate placement of the medication.   Advised to call if fevers/chills, erythema, induration, drainage, or persistent bleeding.   Images permanently stored and available for review in the ultrasound unit.  Impression: Technically successful ultrasound guided injection.       Results for orders placed or performed in visit on 03/08/17 (from the past 72 hour(s))  POCT Urinalysis Dipstick  Status: Abnormal   Collection Time: 03/08/17  1:36 PM  Result Value Ref Range   Color, UA YELLOW    Clarity, UA CLEAR    Glucose, UA NEGATIVE    Bilirubin, UA NEGATIVE    Ketones, UA TRACE    Spec Grav, UA >=1.030 (A) 1.010 - 1.025   Blood, UA NEGATIVE    pH, UA 5.5 5.0 - 8.0   Protein, UA NEGATIVE    Urobilinogen, UA 0.2 0.2 or 1.0 E.U./dL   Nitrite, UA NEGATIVE    Leukocytes, UA Negative Negative   Appearance     Odor     Lab Results  Component Value Date   PSA 1.1 10/18/2016   PSA 0.76 12/09/2014   PSA 0.82 10/18/2013     Chemistry      Component Value Date/Time   NA 140 12/13/2016 1505   K 4.6 12/13/2016 1505   CL 101 12/13/2016 1505   CO2 30 12/13/2016 1505   BUN 25 12/13/2016 1505   CREATININE 0.93 12/13/2016 1505      Component Value Date/Time   CALCIUM 10.0 12/13/2016 1505   ALKPHOS 66 10/14/2015 1111   AST 33 10/18/2016 1049   ALT 28 10/18/2016 1049   BILITOT 0.6 10/18/2016 1049       Assessment and Plan: 67 y.o. male with  Right shoulder pain.  Pain is much more likely intra-articular etiology given immediate pain response with intra-articular injection.  Plan for continued home exercise program.  If pain returns quickly following this injection next step would be x-ray and MRI to evaluate structural causes of shoulder  pain to dictate treatment.  BPH: Typically well controlled.  Symptoms much better controlled on finasteride and tamsulosin.  PSA 1.1 at last check.  Reasonable for urology follow-up for this issue.  Referral placed.  Kidney stone history: None in the last almost 3 months.  Plan for watchful waiting.  Reasonable to have urology follow-up as well.  Referral placed.  Follow-up with gastroneurology for recheck colon cancer screening.  Patient will call himself.  Pneumonia 23 vaccine given today prior to discharge.  Orders Placed This Encounter  Procedures  . Pneumococcal polysaccharide vaccine 23-valent greater than or equal to 2yo subcutaneous/IM  . Ambulatory referral to Urology    Referral Priority:   Routine    Referral Type:   Consultation    Referral Reason:   Specialty Services Required    Requested Specialty:   Urology    Number of Visits Requested:   1  . POCT Urinalysis Dipstick   No orders of the defined types were placed in this encounter.    Discussed warning signs or symptoms. Please see discharge instructions. Patient expresses understanding.

## 2017-03-08 NOTE — Patient Instructions (Addendum)
Thank you for coming in today. Please call [facility] to follow up with Dr Shary Key  [phone]  You should hear about Urology as well.    Let me know if your shoudler does not imporve. Next step is MRI.  I will get back to you about pain management.    Recheck as needed.   Call or go to the ER if you develop a large red swollen joint with extreme pain or oozing puss.

## 2017-03-16 ENCOUNTER — Telehealth: Payer: Self-pay

## 2017-03-16 DIAGNOSIS — M25511 Pain in right shoulder: Secondary | ICD-10-CM

## 2017-03-16 NOTE — Telephone Encounter (Signed)
Patient called stated that his entire shoulder is hurting he was seen on 03/08/2017 and he got an injection but it is not helping. Patient would like to proceed with the next with the Xray-MRI. Please advise. Rhonda Cunningham,CMA

## 2017-03-17 NOTE — Telephone Encounter (Signed)
Left detailed message on patient vm advising that MRI order has been placed and to follow up in the office a day or 2 after he have it done. Rhonda Cunningham,CMA

## 2017-03-17 NOTE — Telephone Encounter (Signed)
MRI ordered.  You should hear about scheduling in the near future.  Recheck a day or 2 after MRI

## 2017-03-22 ENCOUNTER — Encounter: Payer: Self-pay | Admitting: Family Medicine

## 2017-03-22 ENCOUNTER — Ambulatory Visit (INDEPENDENT_AMBULATORY_CARE_PROVIDER_SITE_OTHER): Payer: Medicare Other | Admitting: Family Medicine

## 2017-03-22 DIAGNOSIS — S46119A Strain of muscle, fascia and tendon of long head of biceps, unspecified arm, initial encounter: Secondary | ICD-10-CM

## 2017-03-22 DIAGNOSIS — S46111A Strain of muscle, fascia and tendon of long head of biceps, right arm, initial encounter: Secondary | ICD-10-CM

## 2017-03-22 HISTORY — DX: Strain of muscle, fascia and tendon of long head of biceps, unspecified arm, initial encounter: S46.119A

## 2017-03-22 NOTE — Patient Instructions (Signed)
Thank you for coming in today. Continue you shoulder PT.  We will continue to follow.  Recheck as needed.    Biceps Tendon Disruption (Proximal) The proximal biceps tendon is a strong cord of tissue that connects the biceps muscle, on the front of the upper arm, to the shoulder blade. A proximal biceps tendon disruption can include a partial or complete tear of the tendon near where it connects to the bone near the shoulder. A proximal biceps tendon disruption can interfere with the ability to lift the arm in front of the body, stabilize the shoulder, bend the elbow, and turn the hand palm-up (supination). What are the causes? A biceps tendon disruption happens when the tendon is exposed to too much force. This excess force may be caused by:  The elbow being suddenly straightened from a bent position because of an external force. This could happen, for example, while catching a heavy weight or being pulled when waterskiing.  Wear and tear from physical activity.  Breaking a fall with your hand.  What increases the risk? The following factors may make you more likely to develop this condition:  Playing contact sports.  Doing activities or sports that involve throwing or overhead movements, such as racket sports, gymnastics, or baseball.  Doing activities or sports that involve putting sudden force on the arm, such as weightlifting or waterskiing.  Having a weakened tendon. The tendon may be weak because of: ? Long-lasting (chronic) biceps tendinitis. ? Certain medical conditions, such as diabetes or rheumatoid arthritis. ? Repeated corticosteroid use. ? Repetitive overhead movements.  What are the signs or symptoms? Symptoms of this condition may include:  Sudden sharp pain in the front of the shoulder. Pain may get worse during certain movements, such as: ? Lifting or carrying objects. ? Straightening the elbow. ? Throwing or using overhead movements.  Inflammation or a  feeling of unusual warmth on the front of the shoulder.  Painful tightening (spasm) of the biceps muscle.  A bulge on the inside of the upper arm when the elbow is bent.  Bruising in the shoulder or upper arm. This may develop 24-48 hours after the tendon is injured.  Limited range of motion of the shoulder and elbow.  Weakness in the elbow and forearm when: ? Bending the elbow. ? Rotating the wrist.  How is this diagnosed? This condition is diagnosed based on your symptoms, your medical history, and a physical exam. Your health care provider may test the strength and range of motion of your shoulder and elbow. You may have imaging tests, such as X-rays, MRI, or ultrasound. How is this treated? This condition is treated by resting and icing the injured area, and by doing physical therapy exercises. Depending on the severity of your condition, treatment may also include:  Medicines to help relieve pain and inflammation.  Avoiding certain activities that put stress on your shoulder.  One or more injections of medicines (corticosteroids) into your upper arm to help reduce inflammation (rare).  Surgery to repair the tear. This may be needed if nonsurgical treatments do not improve your condition.  Follow these instructions at home: Managing pain, stiffness, and swelling  If directed, put ice on the injured area: ? Put ice in a plastic bag. ? Place a towel between your skin and the bag. ? Leave the ice on for 20 minutes, 2-3 times a day.  Move your fingers often to avoid stiffness and to lessen swelling.  Raise (elevate) the injured area while  you are sitting or lying down. Activity  Return to your normal activities as told by your health care provider. Ask your health care provider what activities are safe for you.  Avoid activities that cause pain or make your condition worse.  Do not lift anything that is heavier than 10 lb (4.5 kg) until your health care provider  approves.  Do exercises as told by your health care provider. General instructions  Take over-the-counter and prescription medicines only as told by your health care provider.  Do not drive or operate heavy machinery while taking prescription pain medicines.  Keep all follow-up visits as told by your health care provider. This is important. How is this prevented?  Warm up and stretch before being active.  Cool down and stretch after being active.  Give your body time to rest between periods of activity.  Make sure to use equipment that fits you.  Be safe and responsible while being active to avoid falls.  Maintain physical fitness, including strength and flexibility. Contact a health care provider if:  You have symptoms that get worse or do not get better after 2 weeks of treatment.  You develop new symptoms. Get help right away if:  You have severe pain.  You develop pain or numbness in your hand.  Your hand feels unusually cold.  Your fingernails turn a dark color, such as blue or gray. This information is not intended to replace advice given to you by your health care provider. Make sure you discuss any questions you have with your health care provider. Document Released: 12/28/2004 Document Revised: 09/04/2015 Document Reviewed: 12/06/2014 Elsevier Interactive Patient Education  2018 [place].   Biceps Tendon Disruption (Proximal) Rehab Ask your health care provider which exercises are safe for you. Do exercises exactly as told by your health care provider and adjust them as directed. It is normal to feel mild stretching, pulling, tightness, or discomfort as you do these exercises, but you should stop right away if you feel sudden pain or your pain gets worse.Do not begin these exercises until told by your health care provider. Stretching and range of motion exercises These exercises warm up your muscles and joints and improve the movement and flexibility of  your arm and shoulder. These exercises also help to relieve pain and stiffness. Exercise A: Shoulder flexion, standing  1. Stand facing a wall. Put your left / right hand on the wall. 2. Slide your left / right hand up the wall. Stop when you feel a stretch in your shoulder, or when you reach the angle recommended by your health care provider. ? Use your other hand to help raise your arm, if needed. ? As your hand gets higher, you may need to step closer to the wall. ? Avoid shrugging your shoulder while you raise your arm. To do this, keep your shoulder blade tucked down toward your spine. 3. Hold for __________ seconds. 4. Slowly return to the starting position. Use your other arm to help, if needed. Repeat __________ times. Complete this exercise __________ times a day. Exercise B: Pendulum  1. Stand near a wall or a surface that you can hold onto for balance. 2. Bend at the waist and let your left / right arm hang straight down. Use your other arm to support you. 3. Relax your arm and shoulder muscles, and move your hips and your trunk so your left / right arm swings freely. Your arm should swing because of the motion of your  body, not because you are using your arm or shoulder muscles. 4. Keep moving so your arm swings in the following directions, as told by your health care provider: ? Side to side. ? Forward and backward. ? In clockwise and counterclockwise circles. 5. Slowly return to the starting position. Repeat __________ times. Complete this exercise __________ times a day. Strengthening exercises These exercises build strength and endurance in your arm and shoulder. Endurance is the ability to use your muscles for a long time, even after your muscles get tired. Exercise C: Elbow flexion, neutral 1. Sit on a stable chair without armrests, or stand. 2. Hold a __________ weight in your left / right hand, or hold an exercise band with both hands. Your palms should face each other  at the starting position. 3. Bend your left / right elbow and move your hand up toward your shoulder. ? Lead with your thumb, and keep your palm facing the same direction. ? Keep your other arm straight down, in the starting position. 4. Slowly return to the starting position. Repeat __________ times. Complete this exercise __________ times a day. Exercise D: Forearm supination  1. Sit with your left / right forearm on a table. Your elbow should be below shoulder height. Rest your hand over the edge of the table so your palm faces down. 2. If directed, hold a hammer with your left / right hand. 3. Without moving your elbow, slowly rotate your hand so your palm faces up toward the ceiling. ? If you are holding a hammer, begin by holding the hammer near the head. When this exercise gets easier for you, hold the hammer farther down the handle. 4. Hold for __________ seconds. 5. Slowly return to the starting position. Repeat __________ times. Complete this exercise __________ times a day. Exercise E: Scapular retraction  1. Sit in a stable chair without armrests, or stand. 2. Secure an exercise band to a stable object in front of you so the band is at shoulder height. 3. Hold one end of the exercise band in each hand. 4. Squeeze your shoulder blades together and move your elbows slightly behind you. Do not shrug your shoulders. 5. Hold for __________ seconds. 6. Slowly return to the starting position. Repeat __________ times. Complete this exercise __________ times a day. Exercise F: Scapular protraction, supine  1. Lie on your back on a firm surface. Hold a __________ weight in your left / right hand. 2. Raise your left / right arm straight into the air so your hand is directly above your shoulder joint. 3. Push the weight into the air so your shoulder lifts off of the surface that you are lying on. Do not move your head, neck, or back. 4. Hold for __________ seconds. 5. Slowly return to  the starting position. Let your muscles relax completely before you repeat this exercise. Repeat __________ times. Complete this exercise __________ times a day. This information is not intended to replace advice given to you by your health care provider. Make sure you discuss any questions you have with your health care provider. Document Released: 12/28/2004 Document Revised: 09/04/2015 Document Reviewed: 12/06/2014 Elsevier Interactive Patient Education  [employer].

## 2017-03-22 NOTE — Progress Notes (Signed)
Jimmy Mayo Height is a 67 y.o. male with a history of back pain and shoulder pain who presents to [facility] [place] [place] today for evaluation of right bicep deformity. Patient reports some time between Thursday and Friday he noticed that his shoulder pain had improved but he noted his right bicep was rounder and lower than his left bicep. He reports dull pain in his shoulder, but states his strength and movement is intact. He denies numbness or tingling. The patient received a steroid shot into his right shoulder [place] joint 2 weeks prior to this visit. This provided moderate pain relief in that shoulder at that time. He denies any radiating pain severe weakness or numbness.   Past Medical History:  Diagnosis Date  . Anemia   . Anxiety   . Asthma   . Back pain   . Chronic kidney disease   . GERD (gastroesophageal reflux disease)   . Kidney stone   . Rupture long head biceps tendon 03/22/2017   Past Surgical History:  Procedure Laterality Date  . COLONOSCOPY W/ BIOPSIES AND POLYPECTOMY     was to have 1 this past year but planing to see new GI in [place] realize he may need onne every 5 years  . HYDROCELE EXCISION / REPAIR     x3 or 2 on 1 and 1 on the other  . KNEE ARTHROSCOPY     1986  . LITHOTRIPSY    . NASAL SINUS SURGERY    . SPINAL FUSION    . SPINE SURGERY  2006   l spine fusion/rods  . VASECTOMY     Social History   Tobacco Use  . Smoking status: Current Every Day Smoker    Packs/day: 1.50    Years: 35.00    Pack years: 52.50  . Smokeless tobacco: Never Used  Substance Use Topics  . Alcohol use: Yes   family history includes Cancer in his father and mother; Diabetes in his father and maternal grandmother.  ROS as above:  Medications: Current Outpatient Medications  Medication Sig Dispense Refill  . albuterol (PROAIR HFA) 108 (90 Base) MCG/ACT inhaler  Inhale 2 puffs into the lungs every 6 (six) hours as needed. 17 g 2  . allopurinol (ZYLOPRIM) 300 MG tablet Take 1 tablet (300 mg total) by mouth 2 (two) times daily. 180 tablet 0  . ALPRAZolam (XANAX) 0.5 MG tablet TAKE 1 TABLET THREE TIMES DAILY AS NEEDED 90 tablet 3  . Ascorbic Acid (VITAMIN C PO) Take by mouth.    [name] CINNAMON PO Take by mouth.    . ECHINACEA PO Take by mouth.    . esomeprazole (NEXIUM) 40 MG capsule Take 40 mg by mouth daily at 12 noon.    . finasteride (PROSCAR) 5 MG tablet Take 1 tablet (5 mg total) by mouth daily. 90 tablet 1  . FLUoxetine (PROZAC) 10 MG capsule Take 1 capsule (10 mg total) by mouth daily. 30 capsule 1  . Multiple Vitamin (MULTIVITAMIN) capsule Take 1 capsule by mouth daily.      . tamsulosin (FLOMAX) 0.4 MG CAPS capsule Take 1 capsule (0.4 mg total) by mouth daily after breakfast. 30 capsule 3  . traMADol (ULTRAM) 50 MG tablet Take 50 mg by mouth. Take 1-2 tabs by mouth every 8 hours    . valsartan-hydrochlorothiazide (DIOVAN-HCT) 160-12.5 MG tablet Take 1 tablet by mouth daily. 90 tablet 0  . zolpidem (AMBIEN) 10 MG tablet TAKE  1 TABLET DAILY AT BEDTIME AS NEEDED FOR SLEEP NEED OFFICE VISIT BEFORE ANY MORE REFILLS 30 tablet 5   No current facility-administered medications for this visit.    Allergies  Allergen Reactions  . Amoxicillin   . Niacin And Related     Flushing   . Penicillins   . Pravastatin Sodium     REACTION: diarrhea  . Zetia [Ezetimibe]     constipation    Health Maintenance Health Maintenance  Topic Date Due  . TETANUS/TDAP  08/01/2017  . PNA vac Low Risk Adult (2 of 2 - PCV13) 03/08/2018  . COLONOSCOPY  06/01/2022  . INFLUENZA VACCINE  Completed  . Hepatitis C Screening  Completed  . HIV Screening  Completed     Exam:  BP (!) 153/94   Pulse 96   Ht 5\' 9"  (1.753 m)   Wt 148 lb (67.1 kg)   BMI 21.86 kg/m  Gen: Well NAD HEENT: EOMI,  MMM Lungs: Normal work of breathing. CTABL Heart: RRR no MRG Abd: NABS,  Soft. Nondistended, Nontender Exts: Brisk capillary refill, warm and well perfused.   MSK  Right shoulder: Normal-appearing with no deformity in the actual shoulder.. Noticeable obvious Popeye arm deformity present at the right upper arm. Tender to palpation at the bicipital groove mildly. Range of motion is full and intact Strength is intact Patient is nontender to Yergason's and speeds test however there is a noticeable Popeye deformity present at his right upper arm  Right elbow: Normal-appearing nontender.  Nontender at the distal biceps insertions of the radius with a normal test Elbow flexion strength is intact but very slightly diminished compared to contralateral left side. Supination strength is preserved and intact     Assessment and Plan: 67 y.o. male with a history of chronic shoulder pain thought initially to be rotator cuff tendinitis however in retrospect likely some component of it was biceps tendinitis with a long head biceps tendon tear. Fortunately Javeion is feeling pretty good with minimal pain. We discussed the typical treatment and prognosis of a long head proximal biceps tear and 67 year old man which is usually home exercises and watchful waiting and not usually surgery. He has done extremely well with preserved strength and I think home exercises should be sufficient. Will recheck as needed after providing reassurance and discussing the anatomy and what happened.    Discussed warning signs or symptoms. Please see discharge instructions. Patient expresses understanding.  I spent 15 minutes with this patient, greater than 50% was face-to-face time counseling regarding ddx and plan.

## 2017-03-28 ENCOUNTER — Other Ambulatory Visit: Payer: Self-pay | Admitting: Family Medicine

## 2017-03-29 DIAGNOSIS — M4712 Other spondylosis with myelopathy, cervical region: Secondary | ICD-10-CM | POA: Diagnosis not present

## 2017-04-27 ENCOUNTER — Other Ambulatory Visit: Payer: Self-pay | Admitting: Family Medicine

## 2017-05-02 ENCOUNTER — Other Ambulatory Visit: Payer: Self-pay | Admitting: Family Medicine

## 2017-05-16 DIAGNOSIS — H35373 Puckering of macula, bilateral: Secondary | ICD-10-CM | POA: Diagnosis not present

## 2017-05-16 DIAGNOSIS — H40003 Preglaucoma, unspecified, bilateral: Secondary | ICD-10-CM | POA: Diagnosis not present

## 2017-05-16 DIAGNOSIS — H02834 Dermatochalasis of left upper eyelid: Secondary | ICD-10-CM | POA: Diagnosis not present

## 2017-05-16 DIAGNOSIS — H26493 Other secondary cataract, bilateral: Secondary | ICD-10-CM | POA: Diagnosis not present

## 2017-05-16 DIAGNOSIS — H02831 Dermatochalasis of right upper eyelid: Secondary | ICD-10-CM | POA: Diagnosis not present

## 2017-06-01 ENCOUNTER — Ambulatory Visit (INDEPENDENT_AMBULATORY_CARE_PROVIDER_SITE_OTHER): Payer: Medicare Other | Admitting: Family Medicine

## 2017-06-01 ENCOUNTER — Encounter: Payer: Self-pay | Admitting: Family Medicine

## 2017-06-01 VITALS — BP 138/78 | HR 93 | Temp 98.4°F | Ht 69.0 in | Wt 149.0 lb

## 2017-06-01 DIAGNOSIS — M66329 Spontaneous rupture of flexor tendons, unspecified upper arm: Secondary | ICD-10-CM

## 2017-06-01 DIAGNOSIS — J0101 Acute recurrent maxillary sinusitis: Secondary | ICD-10-CM

## 2017-06-01 MED ORDER — PREDNISONE 10 MG PO TABS: 30.0000 mg | ORAL_TABLET | Freq: Every day | ORAL | 0 refills | Status: DC

## 2017-06-01 MED ORDER — AZITHROMYCIN 250 MG PO TABS: 250.0000 mg | ORAL_TABLET | Freq: Every day | ORAL | 0 refills | Status: DC

## 2017-06-01 NOTE — Patient Instructions (Signed)
Thank you for coming in today. Use prednisone and azithromycin.  Recheck if not getting better.  I will ask about biceps tendon treatment.    Sinusitis, Adult Sinusitis is soreness and inflammation of your sinuses. Sinuses are hollow spaces in the bones around your face. Your sinuses are located:  Around your eyes.  In the middle of your forehead.  Behind your nose.  In your cheekbones.  Your sinuses and nasal passages are lined with a stringy fluid (mucus). Mucus normally drains out of your sinuses. When your nasal tissues become inflamed or swollen, the mucus can become trapped or blocked so air cannot flow through your sinuses. This allows bacteria, viruses, and funguses to grow, which leads to infection. Sinusitis can develop quickly and last for 7?10 days (acute) or for more than 12 weeks (chronic). Sinusitis often develops after a cold. What are the causes? This condition is caused by anything that creates swelling in the sinuses or stops mucus from draining, including:  Allergies.  Asthma.  Bacterial or viral infection.  Abnormally shaped bones between the nasal passages.  Nasal growths that contain mucus (nasal polyps).  Narrow sinus openings.  Pollutants, such as chemicals or irritants in the air.  A foreign object stuck in the nose.  A fungal infection. This is rare.  What increases the risk? The following factors may make you more likely to develop this condition:  Having allergies or asthma.  Having had a recent cold or respiratory tract infection.  Having structural deformities or blockages in your nose or sinuses.  Having a weak immune system.  Doing a lot of swimming or diving.  Overusing nasal sprays.  Smoking.  What are the signs or symptoms? The main symptoms of this condition are pain and a feeling of pressure around the affected sinuses. Other symptoms include:  Upper toothache.  Earache.  Headache.  Bad breath.  Decreased sense  of smell and taste.  A cough that may get worse at night.  Fatigue.  Fever.  Thick drainage from your nose. The drainage is often green and it may contain pus (purulent).  Stuffy nose or congestion.  Postnasal drip. This is when extra mucus collects in the throat or back of the nose.  Swelling and warmth over the affected sinuses.  Sore throat.  Sensitivity to light.  How is this diagnosed? This condition is diagnosed based on symptoms, a medical history, and a physical exam. To find out if your condition is acute or chronic, your health care provider may:  Look in your nose for signs of nasal polyps.  Tap over the affected sinus to check for signs of infection.  View the inside of your sinuses using an imaging device that has a light attached (endoscope).  If your health care provider suspects that you have chronic sinusitis, you may also:  Be tested for allergies.  Have a sample of mucus taken from your nose (nasal culture) and checked for bacteria.  Have a mucus sample examined to see if your sinusitis is related to an allergy.  If your sinusitis does not respond to treatment and it lasts longer than 8 weeks, you may have an MRI or CT scan to check your sinuses. These scans also help to determine how severe your infection is. In rare cases, a bone biopsy may be done to rule out more serious types of fungal sinus disease. How is this treated? Treatment for sinusitis depends on the cause and whether your condition is chronic or acute.  If a virus is causing your sinusitis, your symptoms will go away on their own within 10 days. You may be given medicines to relieve your symptoms, including:  Topical nasal decongestants. They shrink swollen nasal passages and let mucus drain from your sinuses.  Antihistamines. These drugs block inflammation that is triggered by allergies. This can help to ease swelling in your nose and sinuses.  Topical nasal corticosteroids. These are  nasal sprays that ease inflammation and swelling in your nose and sinuses.  Nasal saline washes. These rinses can help to get rid of thick mucus in your nose.  If your condition is caused by bacteria, you will be given an antibiotic medicine. If your condition is caused by a fungus, you will be given an antifungal medicine. Surgery may be needed to correct underlying conditions, such as narrow nasal passages. Surgery may also be needed to remove polyps. Follow these instructions at home: Medicines  Take, use, or apply over-the-counter and prescription medicines only as told by your health care provider. These may include nasal sprays.  If you were prescribed an antibiotic medicine, take it as told by your health care provider. Do not stop taking the antibiotic even if you start to feel better. Hydrate and Humidify  Drink enough water to keep your urine clear or pale yellow. Staying hydrated will help to thin your mucus.  Use a cool mist humidifier to keep the humidity level in your home above 50%.  Inhale steam for 10-15 minutes, 3-4 times a day or as told by your health care provider. You can do this in the bathroom while a hot shower is running.  Limit your exposure to cool or dry air. Rest  Rest as much as possible.  Sleep with your head raised (elevated).  Make sure to get enough sleep each night. General instructions  Apply a warm, moist washcloth to your face 3-4 times a day or as told by your health care provider. This will help with discomfort.  Wash your hands often with soap and water to reduce your exposure to viruses and other germs. If soap and water are not available, use hand sanitizer.  Do not smoke. Avoid being around people who are smoking (secondhand smoke).  Keep all follow-up visits as told by your health care provider. This is important. Contact a health care provider if:  You have a fever.  Your symptoms get worse.  Your symptoms do not improve within  10 days. Get help right away if:  You have a severe headache.  You have persistent vomiting.  You have pain or swelling around your face or eyes.  You have vision problems.  You develop confusion.  Your neck is stiff.  You have trouble breathing. This information is not intended to replace advice given to you by your health care provider. Make sure you discuss any questions you have with your health care provider. Document Released: 12/28/2004 Document Revised: 08/24/2015 Document Reviewed: 10/23/2014 Elsevier Interactive Patient Education  [employer].

## 2017-06-01 NOTE — Progress Notes (Signed)
Jimmy Mayo is a 67 y.o. male who presents to [facility]: [facility] today for sinusitis and right biceps muscle cramping.   1) Sinusitis: Patient notes a several day history of runny nose congestion sinus pressure and pain.  He notes symptoms are consistent with prior episodes of sinusitis.  In the past he is done quite well with prednisone and azithromycin.  He is tried some over-the-counter medications which have not helped.  No fevers or chills vomiting or diarrhea.  2) right biceps pain: [name] was seen March 12 for right shoulder pain with new Popeye arm deformity thought to be spontaneous rupture of the long head of the proximal biceps tendon.  He did quite well without a lot of pain.  He notes however with prolonged elbow flexion and repeated elbow flexion his biceps muscle belly will cramp and become painful.  He notes this is mildly bothersome   ROS as above:  Exam:  BP 138/78   Pulse 93   Temp 98.4 F (36.9 C) (Oral)   Ht 5\' 9"  (1.753 m)   Wt 149 lb (67.6 kg)   BMI 22.00 kg/m  Gen: Well NAD HEENT: EOMI,  MMM clear nasal discharge.  Inflamed nasal turbinates.  Tender to palpation maxillary sinuses bilaterally.  Normal tympanic membranes.  Normal posterior pharynx.  Mild cervical lymphadenopathy. Lungs: Normal work of breathing. CTABL Heart: RRR no MRG Abd: NABS, Soft. Nondistended, Nontender Exts: Brisk capillary refill, warm and well perfused.  Right upper extremity: Popeye arm deformity.  Mildly tender to palpation muscle belly.  Spasm and cramping seen with prolonged elbow flexion   Assessment and Plan: 67 y.o. male with  Sinusitis recurrent.  Treatment with prednisone and azithromycin.  Continue over-the-counter medications.  Recheck as needed.  Right arm biceps pain.  Patient has spasm at the muscle belly of the biceps as it has recoiled distally following  rupture of the long head of the biceps tendon.  Typically this condition is managed conservatively but I will discuss with my surgical colleagues if anchoring the biceps tendon with tenodesis is reasonable in this situation.   No orders of the defined types were placed in this encounter.  Meds ordered this encounter  Medications  . azithromycin (ZITHROMAX) 250 MG tablet    Sig: Take 1 tablet (250 mg total) by mouth daily. Take first 2 tablets together, then 1 every day until finished.    Dispense:  6 tablet    Refill:  0  . predniSONE (DELTASONE) 10 MG tablet    Sig: Take 3 tablets (30 mg total) by mouth daily with breakfast.    Dispense:  15 tablet    Refill:  0     Historical information moved to improve visibility of documentation.  Past Medical History:  Diagnosis Date  . Anemia   . Anxiety   . Asthma   . Back pain   . Chronic kidney disease   . GERD (gastroesophageal reflux disease)   . Kidney stone   . Rupture long head biceps tendon 03/22/2017   Past Surgical History:  Procedure Laterality Date  . COLONOSCOPY W/ BIOPSIES AND POLYPECTOMY     was to have 1 this past year but planing to see new GI in [place] realize he may need onne every 5 years  . HYDROCELE EXCISION / REPAIR     x3 or 2 on 1 and 1 on the other  . KNEE ARTHROSCOPY  1986  . LITHOTRIPSY    . NASAL SINUS SURGERY    . SPINAL FUSION    . SPINE SURGERY  2006   l spine fusion/rods  . VASECTOMY     Social History   Tobacco Use  . Smoking status: Current Every Day Smoker    Packs/day: 1.50    Years: 35.00    Pack years: 52.50  . Smokeless tobacco: Never Used  Substance Use Topics  . Alcohol use: Yes   family history includes Cancer in his father and mother; Diabetes in his father and maternal grandmother.  Medications: Current Outpatient Medications  Medication Sig Dispense Refill  . albuterol (PROAIR HFA) 108 (90 Base) MCG/ACT inhaler Inhale 2 puffs into the lungs every 6 (six) hours as  needed. 17 g 2  . allopurinol (ZYLOPRIM) 300 MG tablet Take 1 tablet (300 mg total) by mouth 2 (two) times daily. 180 tablet 1  . ALPRAZolam (XANAX) 0.5 MG tablet TAKE 1 TABLET THREE TIMES DAILY AS NEEDED 90 tablet 5  . Ascorbic Acid (VITAMIN C PO) Take by mouth.    [name] CINNAMON PO Take by mouth.    . ECHINACEA PO Take by mouth.    . esomeprazole (NEXIUM) 40 MG capsule Take 40 mg by mouth daily at 12 noon.    . finasteride (PROSCAR) 5 MG tablet Take 1 tablet (5 mg total) by mouth daily. 90 tablet 1  . FLUoxetine (PROZAC) 10 MG capsule Take 1 capsule (10 mg total) by mouth daily. 30 capsule 1  . Multiple Vitamin (MULTIVITAMIN) capsule Take 1 capsule by mouth daily.      . tamsulosin (FLOMAX) 0.4 MG CAPS capsule Take 1 capsule (0.4 mg total) by mouth daily after breakfast. 30 capsule 3  . traMADol (ULTRAM) 50 MG tablet Take 50 mg by mouth. Take 1-2 tabs by mouth every 8 hours    . valsartan-hydrochlorothiazide (DIOVAN-HCT) 160-12.5 MG tablet Take 1 tablet by mouth daily. 90 tablet 0  . zolpidem (AMBIEN) 10 MG tablet TAKE 1 TABLET DAILY AT BEDTIME AS NEEDED FOR SLEEP NEED OFFICE VISIT BEFORE ANY MORE REFILLS 30 tablet 5  . azithromycin (ZITHROMAX) 250 MG tablet Take 1 tablet (250 mg total) by mouth daily. Take first 2 tablets together, then 1 every day until finished. 6 tablet 0  . predniSONE (DELTASONE) 10 MG tablet Take 3 tablets (30 mg total) by mouth daily with breakfast. 15 tablet 0   No current facility-administered medications for this visit.    Allergies  Allergen Reactions  . Amoxicillin   . Niacin And Related     Flushing   . Penicillins   . Pravastatin Sodium     REACTION: diarrhea  . Zetia [Ezetimibe]     constipation    Health Maintenance Health Maintenance  Topic Date Due  . TETANUS/TDAP  08/01/2017  . INFLUENZA VACCINE  08/11/2017  . PNA vac Low Risk Adult (2 of 2 - PCV13) 03/08/2018  . COLONOSCOPY  06/01/2022  . Hepatitis C Screening  Completed  . HIV Screening   Completed    Discussed warning signs or symptoms. Please see discharge instructions. Patient expresses understanding.

## 2017-07-20 ENCOUNTER — Other Ambulatory Visit: Payer: Self-pay | Admitting: Family Medicine

## 2017-08-01 ENCOUNTER — Other Ambulatory Visit: Payer: Self-pay | Admitting: Family Medicine

## 2017-08-10 DIAGNOSIS — M25511 Pain in right shoulder: Secondary | ICD-10-CM | POA: Diagnosis not present

## 2017-08-22 DIAGNOSIS — M19011 Primary osteoarthritis, right shoulder: Secondary | ICD-10-CM | POA: Diagnosis not present

## 2017-08-22 DIAGNOSIS — M25511 Pain in right shoulder: Secondary | ICD-10-CM | POA: Diagnosis not present

## 2017-08-22 DIAGNOSIS — S46111A Strain of muscle, fascia and tendon of long head of biceps, right arm, initial encounter: Secondary | ICD-10-CM | POA: Diagnosis not present

## 2017-08-22 DIAGNOSIS — M7581 Other shoulder lesions, right shoulder: Secondary | ICD-10-CM | POA: Diagnosis not present

## 2017-08-22 DIAGNOSIS — S46211A Strain of muscle, fascia and tendon of other parts of biceps, right arm, initial encounter: Secondary | ICD-10-CM | POA: Diagnosis not present

## 2017-08-24 ENCOUNTER — Other Ambulatory Visit: Payer: Self-pay | Admitting: Family Medicine

## 2017-09-07 ENCOUNTER — Encounter: Payer: Self-pay | Admitting: Family Medicine

## 2017-09-07 ENCOUNTER — Ambulatory Visit (INDEPENDENT_AMBULATORY_CARE_PROVIDER_SITE_OTHER): Payer: Medicare Other | Admitting: Family Medicine

## 2017-09-07 VITALS — BP 143/90 | HR 92 | Ht 69.0 in | Wt 152.0 lb

## 2017-09-07 DIAGNOSIS — Z23 Encounter for immunization: Secondary | ICD-10-CM

## 2017-09-07 DIAGNOSIS — M7742 Metatarsalgia, left foot: Secondary | ICD-10-CM

## 2017-09-07 NOTE — Progress Notes (Signed)
Jimmy Mayo is a 67 y.o. male who presents to [facility] today for left foot pain.  Dianne notes pain at the plantar aspect of his left fifth MCP.  He notes that he stepped on a object in a hot tub about 3 weeks ago and had worsening pain since.  He has pain is worse with prolonged standing.  He denies any other significant injury.  No significant swelling or bruising.  No fevers or chills nausea vomiting or diarrhea.  No particular treatment tried yet.    ROS:  As above  Exam:  BP (!) 143/90   Pulse 92   Ht 5\' 9"  (1.753 m)   Wt 152 lb (68.9 kg)   BMI 22.45 kg/m  General: Well Developed, well nourished, and in no acute distress.  Neuro/Psych: Alert and oriented x3, extra-ocular muscles intact, able to move all 4 extremities, sensation grossly intact. Skin: Warm and dry, no rashes noted.  Respiratory: Not using accessory muscles, speaking in full sentences, trachea midline.  Cardiovascular: Pulses palpable, no extremity edema. Abdomen: Does not appear distended. MSK:  Left foot: No severe deformity.  Slight bunion and bunionette present.  Slight callus at the plantar fifth MTP without plantar wart. Tender to palpation fifth MTP plantar aspects. Normal foot and ankle motion. Normal strength Pulses capillary fill and sensation are intact.      Assessment and Plan: 67 y.o. male with left foot plantar fifth MTP pain likely metatarsalgia.  Plan for fifth ray post with float at MTP.  Should patient had a constructive zone out of adhesive foam.  Recheck if not improving in the near future.  Tdap given today prior to discharge. Patient would like to delay influenza vaccine until later this year.  Orders Placed This Encounter  Procedures  . Tdap vaccine greater than or equal to 7yo IM   No orders of the defined types were placed in this encounter.   Historical information moved to improve visibility of documentation.  Past  Medical History:  Diagnosis Date  . Anemia   . Anxiety   . Asthma   . Back pain   . Chronic kidney disease   . GERD (gastroesophageal reflux disease)   . Kidney stone   . Rupture long head biceps tendon 03/22/2017   Past Surgical History:  Procedure Laterality Date  . COLONOSCOPY W/ BIOPSIES AND POLYPECTOMY     was to have 1 this past year but planing to see new GI in [place] realize he may need onne every 5 years  . HYDROCELE EXCISION / REPAIR     x3 or 2 on 1 and 1 on the other  . KNEE ARTHROSCOPY     1986  . LITHOTRIPSY    . NASAL SINUS SURGERY    . SPINAL FUSION    . SPINE SURGERY  2006   l spine fusion/rods  . VASECTOMY     Social History   Tobacco Use  . Smoking status: Current Every Day Smoker    Packs/day: 1.50    Years: 35.00    Pack years: 52.50  . Smokeless tobacco: Never Used  Substance Use Topics  . Alcohol use: Yes   family history includes Cancer in his father and mother; Diabetes in his father and maternal grandmother.  Medications: Current Outpatient Medications  Medication Sig Dispense Refill  . allopurinol (ZYLOPRIM) 300 MG tablet Take 1 tablet (300 mg total) by mouth 2 (two) times daily. 180 tablet 1  .  ALPRAZolam (XANAX) 0.5 MG tablet TAKE 1 TABLET THREE TIMES DAILY AS NEEDED 90 tablet 5  . Ascorbic Acid (VITAMIN C PO) Take by mouth.    [name] CINNAMON PO Take by mouth.    . ECHINACEA PO Take by mouth.    . esomeprazole (NEXIUM) 40 MG capsule Take 40 mg by mouth daily at 12 noon.    . finasteride (PROSCAR) 5 MG tablet Take 1 tablet (5 mg total) by mouth daily. 90 tablet 1  . FLUoxetine (PROZAC) 10 MG capsule Take 1 capsule (10 mg total) by mouth daily. 30 capsule 1  . Multiple Vitamin (MULTIVITAMIN) capsule Take 1 capsule by mouth daily.      [name] PROAIR HFA 108 (90 Base) MCG/ACT inhaler INHALE 2 PUFFS BY MOUTH EVERY 6 HOURS AS NEEDED 17 g 0  . tamsulosin (FLOMAX) 0.4 MG CAPS capsule Take 1 capsule (0.4 mg total) by mouth daily after breakfast. 30  capsule 3  . traMADol (ULTRAM) 50 MG tablet Take 50 mg by mouth. Take 1-2 tabs by mouth every 8 hours    . valsartan-hydrochlorothiazide (DIOVAN-HCT) 160-12.5 MG tablet TAKE 1 TABLET BY MOUTH DAILY. 90 tablet 0  . zolpidem (AMBIEN) 10 MG tablet TAKE 1 TABLET BY MOUTH DAILY AT BEDTIME AS NEEDED FOR SLEEP 90 tablet 1   No current facility-administered medications for this visit.    Allergies  Allergen Reactions  . Amoxicillin   . Niacin And Related     Flushing   . Penicillins   . Pravastatin Sodium     REACTION: diarrhea  . Zetia [Ezetimibe]     constipation      Discussed warning signs or symptoms. Please see discharge instructions. Patient expresses understanding.

## 2017-09-07 NOTE — Patient Instructions (Signed)
Thank you for coming in today. Use the float or post to offload pressure on the 5th metatarsal phalangeal joint.   Look for bunionette pads or corn pads.   Hygloss Sheets for [employer] Foam for [employer], 12" x 18", Blue, 10 Piece

## 2017-09-20 ENCOUNTER — Other Ambulatory Visit: Payer: Self-pay | Admitting: Family Medicine

## 2017-09-21 DIAGNOSIS — M25511 Pain in right shoulder: Secondary | ICD-10-CM | POA: Diagnosis not present

## 2017-09-28 DIAGNOSIS — M79672 Pain in left foot: Secondary | ICD-10-CM | POA: Diagnosis not present

## 2017-10-13 ENCOUNTER — Other Ambulatory Visit: Payer: Self-pay | Admitting: Family Medicine

## 2017-10-26 DIAGNOSIS — M79672 Pain in left foot: Secondary | ICD-10-CM | POA: Diagnosis not present

## 2017-10-30 ENCOUNTER — Other Ambulatory Visit: Payer: Self-pay | Admitting: Family Medicine

## 2017-11-08 ENCOUNTER — Telehealth: Payer: Self-pay | Admitting: Family Medicine

## 2017-11-08 DIAGNOSIS — R350 Frequency of micturition: Secondary | ICD-10-CM

## 2017-11-08 DIAGNOSIS — N401 Enlarged prostate with lower urinary tract symptoms: Secondary | ICD-10-CM

## 2017-11-08 DIAGNOSIS — M4712 Other spondylosis with myelopathy, cervical region: Secondary | ICD-10-CM | POA: Diagnosis not present

## 2017-11-08 DIAGNOSIS — I10 Essential (primary) hypertension: Secondary | ICD-10-CM

## 2017-11-08 DIAGNOSIS — E782 Mixed hyperlipidemia: Secondary | ICD-10-CM

## 2017-11-08 DIAGNOSIS — Z0000 Encounter for general adult medical examination without abnormal findings: Secondary | ICD-10-CM

## 2017-11-08 NOTE — Telephone Encounter (Signed)
Patient called in wanting labs orders placed prior to his CPE. Will be coming in November 5th to pick up his lab orders.

## 2017-11-09 NOTE — Telephone Encounter (Signed)
Labs ready for pickup

## 2017-11-09 NOTE — Telephone Encounter (Signed)
Left message on patient vm advising that labs are ready for pickup. Rhonda Cunningham,CMA

## 2017-11-14 ENCOUNTER — Encounter: Payer: Medicare Other | Admitting: Family Medicine

## 2017-11-14 DIAGNOSIS — I10 Essential (primary) hypertension: Secondary | ICD-10-CM | POA: Diagnosis not present

## 2017-11-14 DIAGNOSIS — E782 Mixed hyperlipidemia: Secondary | ICD-10-CM | POA: Diagnosis not present

## 2017-11-14 DIAGNOSIS — Z0000 Encounter for general adult medical examination without abnormal findings: Secondary | ICD-10-CM | POA: Diagnosis not present

## 2017-11-14 DIAGNOSIS — H40003 Preglaucoma, unspecified, bilateral: Secondary | ICD-10-CM | POA: Diagnosis not present

## 2017-11-14 DIAGNOSIS — R350 Frequency of micturition: Secondary | ICD-10-CM | POA: Diagnosis not present

## 2017-11-14 DIAGNOSIS — H26493 Other secondary cataract, bilateral: Secondary | ICD-10-CM | POA: Diagnosis not present

## 2017-11-15 LAB — PSA: PSA: 2.1 ng/mL (ref <=4.0)

## 2017-11-15 LAB — COMPREHENSIVE METABOLIC PANEL
AG Ratio: 1.8 (calc) (ref 1.0–2.5)
ALT: 25 U/L (ref 9–46)
AST: 29 U/L (ref 10–35)
Albumin: 4.4 g/dL (ref 3.6–5.1)
Alkaline phosphatase (APISO): 62 U/L (ref 40–115)
BUN: 15 mg/dL (ref 7–25)
CO2: 32 mmol/L (ref 20–32)
CREATININE: 0.83 mg/dL (ref 0.70–1.25)
Calcium: 10 mg/dL (ref 8.6–10.3)
Chloride: 99 mmol/L (ref 98–110)
GLUCOSE: 93 mg/dL (ref 65–99)
Globulin: 2.5 g/dL (calc) (ref 1.9–3.7)
Potassium: 4.3 mmol/L (ref 3.5–5.3)
Sodium: 139 mmol/L (ref 135–146)
Total Bilirubin: 0.5 mg/dL (ref 0.2–1.2)
Total Protein: 6.9 g/dL (ref 6.1–8.1)

## 2017-11-15 LAB — CBC
HCT: 46.8 % (ref 38.5–50.0)
Hemoglobin: 16 g/dL (ref 13.2–17.1)
MCH: 32 pg (ref 27.0–33.0)
MCHC: 34.2 g/dL (ref 32.0–36.0)
MCV: 93.6 fL (ref 80.0–100.0)
MPV: 13.1 fL — AB (ref 7.5–12.5)
PLATELETS: 254 10*3/uL (ref 140–400)
RBC: 5 10*6/uL (ref 4.20–5.80)
RDW: 13.9 % (ref 11.0–15.0)
WBC: 6.9 10*3/uL (ref 3.8–10.8)

## 2017-11-15 LAB — LIPID PANEL
CHOLESTEROL: 200 mg/dL — AB (ref <200)
HDL: 53 mg/dL (ref >40)
Non-HDL Cholesterol (Calc): 147 mg/dL (calc) — ABNORMAL HIGH (ref <130)
TRIGLYCERIDES: 522 mg/dL — AB (ref <150)
Total CHOL/HDL Ratio: 3.8 (calc) (ref <5.0)

## 2017-11-16 ENCOUNTER — Telehealth: Payer: Self-pay | Admitting: Family Medicine

## 2017-11-16 ENCOUNTER — Encounter: Payer: Self-pay | Admitting: Family Medicine

## 2017-11-16 ENCOUNTER — Ambulatory Visit (INDEPENDENT_AMBULATORY_CARE_PROVIDER_SITE_OTHER): Payer: Medicare Other | Admitting: Family Medicine

## 2017-11-16 VITALS — BP 135/84 | HR 89 | Ht 69.0 in | Wt 146.0 lb

## 2017-11-16 DIAGNOSIS — J410 Simple chronic bronchitis: Secondary | ICD-10-CM | POA: Diagnosis not present

## 2017-11-16 DIAGNOSIS — N401 Enlarged prostate with lower urinary tract symptoms: Secondary | ICD-10-CM | POA: Diagnosis not present

## 2017-11-16 DIAGNOSIS — F330 Major depressive disorder, recurrent, mild: Secondary | ICD-10-CM

## 2017-11-16 DIAGNOSIS — R972 Elevated prostate specific antigen [PSA]: Secondary | ICD-10-CM | POA: Diagnosis not present

## 2017-11-16 DIAGNOSIS — F17200 Nicotine dependence, unspecified, uncomplicated: Secondary | ICD-10-CM

## 2017-11-16 DIAGNOSIS — Z0000 Encounter for general adult medical examination without abnormal findings: Secondary | ICD-10-CM

## 2017-11-16 DIAGNOSIS — Z23 Encounter for immunization: Secondary | ICD-10-CM | POA: Diagnosis not present

## 2017-11-16 DIAGNOSIS — F1010 Alcohol abuse, uncomplicated: Secondary | ICD-10-CM

## 2017-11-16 DIAGNOSIS — Z6821 Body mass index (BMI) 21.0-21.9, adult: Secondary | ICD-10-CM

## 2017-11-16 DIAGNOSIS — G894 Chronic pain syndrome: Secondary | ICD-10-CM

## 2017-11-16 DIAGNOSIS — R350 Frequency of micturition: Secondary | ICD-10-CM

## 2017-11-16 DIAGNOSIS — E782 Mixed hyperlipidemia: Secondary | ICD-10-CM

## 2017-11-16 MED ORDER — AZELASTINE HCL 0.1 % NA SOLN: 2.0000 | Freq: Two times a day (BID) | NASAL | 12 refills | Status: DC

## 2017-11-16 MED ORDER — ALLOPURINOL 300 MG PO TABS: 300.0000 mg | ORAL_TABLET | Freq: Two times a day (BID) | ORAL | 3 refills | Status: DC

## 2017-11-16 MED ORDER — ALPRAZOLAM 0.5 MG PO TABS: 0.5000 mg | ORAL_TABLET | Freq: Three times a day (TID) | ORAL | 5 refills | Status: DC | PRN

## 2017-11-16 NOTE — Progress Notes (Signed)
HPI: Jimmy Mayo is a 67 y.o. male  who presents to [place] today, 11/17/17,  for Medicare Annual Wellness Exam  Patient presents for annual physical/Medicare wellness exam.  Discussed elevated triglycerides, increased PSA, and mood.  Feeling well otherwise   Past medical, surgical, social and family history reviewed:  Patient Active Problem List   Diagnosis Date Noted  . Rupture long head biceps tendon 03/22/2017  . History of kidney stones 03/08/2017  . Rotator cuff tendonitis, right 12/07/2016  . Chronic pain 09/03/2016  . Alcohol abuse 09/24/2015  . BPH (benign prostatic hyperplasia) 07/02/2015  . Chronic bronchitis ([place]) 05/23/2015  . Pigmented skin lesion 09/27/2012  . Dry eye 09/27/2012  . Personal history of colonic polyps 06/01/2012  . Abdominal pain, epigastric 06/01/2012  . Hyperlipidemia 02/16/2012  . Prediabetes 02/16/2012  . Seborrheic keratosis 02/16/2012  . Anxiety 02/15/2012  . History of gout 12/29/2011  . Depression 12/29/2011  . Family history of prostate cancer 12/29/2011  . Essential hypertension, benign 02/23/2010  . NEPHROLITHIASIS 10/15/2009  . MECHANICAL PTOSIS 12/09/2008  . NICOTINE ADDICTION 08/02/2007  . HEMATURIA UNSPECIFIED 08/02/2007  . [place] DISEASE, LUMBAR 01/04/2007  . Insomnia 01/04/2007    Past Surgical History:  Procedure Laterality Date  . COLONOSCOPY W/ BIOPSIES AND POLYPECTOMY     was to have 1 this past year but planing to see new GI in [place] realize he may need onne every 5 years  . HYDROCELE EXCISION / REPAIR     x3 or 2 on 1 and 1 on the other  . KNEE ARTHROSCOPY     1986  . LITHOTRIPSY    . NASAL SINUS SURGERY    . SPINAL FUSION    . SPINE SURGERY  2006   l spine fusion/rods  . VASECTOMY      Social History   Socioeconomic History  . Marital status: Divorced    Spouse name: Not on file  . Number of children: Not on file  . Years of education: Not on file  .  Highest education level: Not on file  Occupational History  . Not on file  Social Needs  . Financial resource strain: Not on file  . Food insecurity:    Worry: Not on file    Inability: Not on file  . Transportation needs:    Medical: Not on file    Non-medical: Not on file  Tobacco Use  . Smoking status: Current Every Day Smoker    Packs/day: 1.50    Years: 35.00    Pack years: 52.50  . Smokeless tobacco: Never Used  Substance and Sexual Activity  . Alcohol use: Yes  . Drug use: No  . Sexual activity: Not on file  Lifestyle  . Physical activity:    Days per week: Not on file    Minutes per session: Not on file  . Stress: Not on file  Relationships  . Social connections:    Talks on phone: Not on file    Gets together: Not on file    Attends religious service: Not on file    Active member of club or organization: Not on file    Attends meetings of clubs or organizations: Not on file    Relationship status: Not on file  . Intimate partner violence:    Fear of current or ex partner: Not on file    Emotionally abused: Not on file    Physically abused: Not on file  Forced sexual activity: Not on file  Other Topics Concern  . Not on file  Social History Narrative  . Not on file    Family History  Problem Relation Age of Onset  . Cancer Mother        colon  . Cancer Father        colon, prostate  . Diabetes Father   . Prostate cancer Father   . Diabetes Maternal Grandmother      Current medication list and allergy/intolerance information reviewed:    Outpatient Encounter Medications as of 11/16/2017  Medication Sig Note  . allopurinol (ZYLOPRIM) 300 MG tablet Take 1 tablet (300 mg total) by mouth 2 (two) times daily.   [name] ALPRAZolam (XANAX) 0.5 MG tablet Take 1 tablet (0.5 mg total) by mouth 3 (three) times daily as needed.   . Ascorbic Acid (VITAMIN C PO) Take by mouth.   [name] CINNAMON PO Take by mouth.   . ECHINACEA PO Take by mouth.   . esomeprazole  (NEXIUM) 40 MG capsule Take 40 mg by mouth daily at 12 noon.   . finasteride (PROSCAR) 5 MG tablet TAKE 1 TABLET BY MOUTH DAILY   . Multiple Vitamin (MULTIVITAMIN) capsule Take 1 capsule by mouth daily.     . Omega-3 1000 MG CAPS Take 2,000 mg by mouth 2 (two) times daily.   [name] PROAIR HFA 108 (90 Base) MCG/ACT inhaler INHALE 2 PUFFS BY MOUTH EVERY 6 HOURS AS NEEDED   . traMADol (ULTRAM) 50 MG tablet Take 50 mg by mouth. Take 1-2 tabs by mouth every 8 hours 07/18/2012: Pt states he takes 5 pills daily  . valsartan-hydrochlorothiazide (DIOVAN-HCT) 160-12.5 MG tablet TAKE 1 TABLET BY MOUTH DAILY.   [name] zolpidem (AMBIEN) 10 MG tablet TAKE 1 TABLET BY MOUTH DAILY AT BEDTIME AS NEEDED FOR SLEEP   . [DISCONTINUED] allopurinol (ZYLOPRIM) 300 MG tablet Take 1 tablet (300 mg total) by mouth 2 (two) times daily.   [name] azelastine (ASTELIN) 0.1 % nasal spray Place 2 sprays into both nostrils 2 (two) times daily. Use in each nostril as directed   . [DISCONTINUED] FLUoxetine (PROZAC) 10 MG capsule Take 1 capsule (10 mg total) by mouth daily. (Patient not taking: Reported on 11/16/2017)   . [DISCONTINUED] tamsulosin (FLOMAX) 0.4 MG CAPS capsule Take 1 capsule (0.4 mg total) by mouth daily after breakfast. (Patient not taking: Reported on 11/16/2017)    No facility-administered encounter medications on file as of 11/16/2017.     Allergies  Allergen Reactions  . Amoxicillin   . Niacin And Related     Flushing   . Penicillins   . Pravastatin Sodium     REACTION: diarrhea  . Zetia [Ezetimibe]     constipation       Review of Systems: No headache, visual changes, nausea, vomiting, diarrhea, constipation, dizziness, abdominal pain, skin rash, fevers, chills, night sweats, weight loss, swollen lymph nodes, body aches, joint swelling, muscle aches, chest pain, shortness of breath, mood changes, visual or auditory hallucinations.     Medicare Wellness Questionnaire  Are there smokers in your home (other than  you)? no  Depression screen [facility] 2/9 11/16/2017 11/04/2016 09/02/2016 10/21/2015 09/24/2015  Decreased Interest 1 1 1 1 1   Down, Depressed, Hopeless 1 2 - 1 1  PHQ - 2 Score 2 3 1 2 2   Altered sleeping 2 0 - 3 2  Tired, decreased energy 2 2 - 3 2  Change in appetite 2 1 - 3 2  Feeling bad or failure about yourself  1 1 - 2 2  Trouble concentrating 1 1 - 1 2  Moving slowly or fidgety/restless 0 1 - 1 2  Suicidal thoughts 0 0 - 0 0  PHQ-9 Score 10 9 - 15 14  Difficult doing work/chores Somewhat difficult - - Somewhat difficult Somewhat difficult        Activities of Daily Living In your present state of health, do you have any difficulty performing the following activities?:  Driving? no Managing money?  no Feeding yourself? no Getting from bed to chair? no Climbing a flight of stairs? no Preparing food and eating?: no Bathing or showering? no Getting dressed: no Getting to the toilet? no Using the toilet: no Moving around from place to place: no In the past year have you fallen or had a near fall?: no  Hearing Difficulties:  Do you often ask people to speak up or repeat themselves? yes Do you experience ringing or noises in your ears? yes  Do you have difficulty understanding soft or whispered voices? yes  Memory Difficulties:  Do you feel that you have a problem with memory? no  Do you often misplace items? no  Do you feel safe at home?  yes  Sexual Health:   Are you sexually active?  Yes  Do you have more than one partner?  No   Risk Factors  Current exercise habits: Daily  Dietary issues discussed:none  Cardiac risk factors: Smoker, Elevated triglycerides   Exam:  BP 135/84   Pulse 89   Ht 5\' 9"  (1.753 m)   Wt 146 lb (66.2 kg)   BMI 21.56 kg/m  Vision by Snellen chart: right eye:see nurse notes, left eye:see nurse notes  Constitutional: VS see above. General Appearance: alert, well-developed, well-nourished, NAD  Ears, Nose, Mouth, Throat:  MMM  Neck: No masses, trachea midline.   Respiratory: Normal respiratory effort. no wheeze, no rhonchi, no rales  Cardiovascular:No lower extremity edema.   Musculoskeletal: Gait normal. No clubbing/cyanosis of digits.   Neurological: Normal balance/coordination. No tremor. Recalls 3 objects and able to read face of watch with correct time.   Skin: warm, dry, intact. No rash/ulcer.   Psychiatric: Normal judgment/insight. Normal mood and affect. Oriented x3.   Recent Results (from the past 2160 hour(s))  CBC     Status: Abnormal   Collection Time: 11/14/17 10:57 AM  Result Value Ref Range   WBC 6.9 3.8 - 10.8 Thousand/uL   RBC 5.00 4.20 - 5.80 Million/uL   Hemoglobin 16.0 13.2 - 17.1 g/dL   HCT 46.8 38.5 - 50.0 %   MCV 93.6 80.0 - 100.0 fL   MCH 32.0 27.0 - 33.0 pg   MCHC 34.2 32.0 - 36.0 g/dL   RDW 13.9 11.0 - 15.0 %   Platelets 254 140 - 400 Thousand/uL   MPV 13.1 (H) 7.5 - 12.5 fL  Comprehensive Metabolic Panel (CMET)     Status: None   Collection Time: 11/14/17 10:57 AM  Result Value Ref Range   Glucose, Bld 93 65 - 99 mg/dL    Comment: .            Fasting reference interval .    BUN 15 7 - 25 mg/dL   Creat 0.83 0.70 - 1.25 mg/dL    Comment: For patients >76 years of age, the reference limit for Creatinine is approximately 13% higher for people identified as African-American. .    BUN/Creatinine Ratio NOT APPLICABLE 6 -  22 (calc)   Sodium 139 135 - 146 mmol/L   Potassium 4.3 3.5 - 5.3 mmol/L   Chloride 99 98 - 110 mmol/L   CO2 32 20 - 32 mmol/L   Calcium 10.0 8.6 - 10.3 mg/dL   Total Protein 6.9 6.1 - 8.1 g/dL   Albumin 4.4 3.6 - 5.1 g/dL   Globulin 2.5 1.9 - 3.7 g/dL (calc)   AG Ratio 1.8 1.0 - 2.5 (calc)   Total Bilirubin 0.5 0.2 - 1.2 mg/dL   Alkaline phosphatase (APISO) 62 40 - 115 U/L   AST 29 10 - 35 U/L   ALT 25 9 - 46 U/L  Lipid Profile     Status: Abnormal   Collection Time: 11/14/17 10:57 AM  Result Value Ref Range   Cholesterol 200 (H) <200  mg/dL   HDL 53 >40 mg/dL   Triglycerides 522 (H) <150 mg/dL    Comment: . If a non-fasting specimen was collected, consider repeat triglyceride testing on a fasting specimen if clinically indicated.  Yates Decamp et al. J. of Clin. Lipidol. 3299;2:426-834. . . There is increased risk of pancreatitis when the  triglyceride concentration is very high  (> or = 500 mg/dL, especially if > or = 1000 mg/dL).  Yates Decamp et al. J. of Clin. Lipidol. 1962;2:297-989. [name]    LDL Cholesterol (Calc)  mg/dL (calc)    Comment: . LDL cholesterol not calculated. Triglyceride levels greater than 400 mg/dL invalidate calculated LDL results. . Reference range: <100 . Desirable range <100 mg/dL for primary prevention;   <70 mg/dL for patients with CHD or diabetic patients  with > or = 2 CHD risk factors. [name] LDL-C is now calculated using the [name]  calculation, which is a validated novel method providing  better accuracy than the Friedewald equation in the  estimation of LDL-C.  Cresenciano Genre et al. Annamaria Helling. 2119;417(40): 2061-2068  (http://education.[facility].com/faq/FAQ164)    Total CHOL/HDL Ratio 3.8 <5.0 (calc)   Non-HDL Cholesterol (Calc) 147 (H) <130 mg/dL (calc)    Comment: For patients with diabetes plus 1 major ASCVD risk  factor, treating to a non-HDL-C goal of <100 mg/dL  (LDL-C of <70 mg/dL) is considered a therapeutic  option.   PSA     Status: None   Collection Time: 11/14/17 10:57 AM  Result Value Ref Range   PSA 2.1 < OR = 4.0 ng/mL    Comment: The total PSA value from this assay system is  standardized against the WHO standard. The test  result will be approximately 20% lower when compared  to the equimolar-standardized total PSA (Beckman  Coulter). Comparison of serial PSA results should be  interpreted with this fact in mind. . This test was performed using the Siemens  chemiluminescent method. Values obtained from  different assay methods cannot be  used interchangeably. PSA levels, regardless of value, should not be interpreted as absolute evidence of the presence or absence of disease.       ASSESSMENT/PLAN:   Encounter for Medicare annual wellness exam  Triglycerides are elevated with recent lab check.  Plan for reduced carbon alcohol recheck in 3 months.  Start 2 g omega-3 fatty acids twice daily.  Patient intolerant to statins previously.  Elevated PSA plan to refer to urology.  PSA is 2.1 on finasteride.  This is up from about 1 previously.  Flu vaccine given today.  Follow depression symptoms  Work on smoking reduction for chronic bronchitis    Health Maintenance Health Maintenance  Topic Date Due  .  PNA vac Low Risk Adult (2 of 2 - PCV13) 03/08/2018  . COLONOSCOPY  06/01/2022  . TETANUS/TDAP  09/08/2027  . INFLUENZA VACCINE  Completed  . Hepatitis C Screening  Completed    Immunization History  Administered Date(s) Administered  . H1N1 12/26/2007  . Influenza Split 09/29/2010, 10/21/2011  . Influenza Whole 01/04/2007, 12/09/2008, 10/15/2009  . Influenza, High Dose Seasonal PF 11/04/2016, 11/16/2017  . Influenza,inj,Quad PF,6+ Mos 09/27/2012, 10/18/2013, 12/11/2014, 09/23/2015  . Pneumococcal Polysaccharide-23 12/29/2011, 03/08/2017  . Td 08/02/2007  . Tdap 09/07/2017  . Zoster 01/07/2012     During the course of the visit the patient was educated and counseled about appropriate screening and preventive services as noted above.   Patient Instructions (the written plan) was given to the patient.  Medicare Attestation I have personally reviewed: The patient's medical and social history Their use of alcohol, tobacco or illicit drugs Their current medications and supplements The patient's functional ability including ADLs,fall risks, home safety risks, cognitive, and hearing and visual impairment Diet and physical activities Evidence for depression or mood disorders  The patient's weight, height,  BMI, and visual acuity have been recorded in the chart.  I have made referrals, counseling, and provided education to the patient based on review of the above and I have provided the patient with a written personalized care plan for preventive services.

## 2017-11-16 NOTE — Telephone Encounter (Signed)
Xanax refill request received via fax.  Electronically prescribed.

## 2017-11-16 NOTE — Patient Instructions (Addendum)
Thank you for coming in today. Increase fish oil to 2000mg   (2 capsules) twice daily if able.   We will recheck lipids in 3 months or so.   Also reduce carbs.  60g or less per meal.   Try to eat lean protein like chicken or fish as apposed to red meats.    Reduce alcohol.    Keep track of mood.   You should hear from Urology about PSA.   Recheck in 3 months.   Food Choices to Lower Your Triglycerides Triglycerides are a type of fat in your blood. High levels of triglycerides can increase the risk of heart disease and stroke. If your triglyceride levels are high, the foods you eat and your eating habits are very important. Choosing the right foods can help lower your triglycerides. What general guidelines do I need to follow?  Lose weight if you are overweight.  Limit or avoid alcohol.  Fill one half of your plate with vegetables and green salads.  Limit fruit to two servings a day. Choose fruit instead of juice.  Make one fourth of your plate whole grains. Look for the word "whole" as the first word in the ingredient list.  Fill one fourth of your plate with lean protein foods.  Enjoy fatty fish (such as salmon, mackerel, sardines, and tuna) three times a week.  Choose healthy fats.  Limit foods high in starch and sugar.  Eat more home-cooked food and less restaurant, buffet, and fast food.  Limit fried foods.  Cook foods using methods other than frying.  Limit saturated fats.  Check ingredient lists to avoid foods with partially hydrogenated oils (trans fats) in them. What foods can I eat? Grains Whole grains, such as whole wheat or whole grain breads, crackers, cereals, and pasta. Unsweetened oatmeal, bulgur, barley, quinoa, or brown rice. Corn or whole wheat flour tortillas. Vegetables Fresh or frozen vegetables (raw, steamed, roasted, or grilled). Green salads. Fruits All fresh, canned (in natural juice), or frozen fruits. Meat and Other Protein  Products Ground beef (85% or leaner), grass-fed beef, or beef trimmed of fat. Skinless chicken or [place]. Ground chicken or [place]. Pork trimmed of fat. All fish and seafood. Eggs. Dried beans, peas, or lentils. Unsalted nuts or seeds. Unsalted canned or dry beans. Dairy Low-fat dairy products, such as skim or 1% milk, 2% or reduced-fat cheeses, low-fat ricotta or cottage cheese, or plain low-fat yogurt. Fats and Oils Tub margarines without trans fats. Light or reduced-fat mayonnaise and salad dressings. Avocado. Safflower, olive, or canola oils. Natural peanut or almond butter. The items listed above may not be a complete list of recommended foods or beverages. Contact your dietitian for more options. What foods are not recommended? Grains White bread. White pasta. White rice. Cornbread. Bagels, pastries, and croissants. Crackers that contain trans fat. Vegetables White potatoes. Corn. Creamed or fried vegetables. Vegetables in a cheese sauce. Fruits Dried fruits. Canned fruit in light or heavy syrup. Fruit juice. Meat and Other Protein Products Fatty cuts of meat. Ribs, chicken wings, bacon, sausage, bologna, salami, chitterlings, fatback, hot dogs, bratwurst, and packaged luncheon meats. Dairy Whole or 2% milk, cream, half-and-half, and cream cheese. Whole-fat or sweetened yogurt. Full-fat cheeses. Nondairy creamers and whipped toppings. Processed cheese, cheese spreads, or cheese curds. Sweets and Desserts Corn syrup, sugars, honey, and molasses. Candy. Jam and jelly. Syrup. Sweetened cereals. Cookies, pies, cakes, donuts, muffins, and ice cream. Fats and Oils Butter, stick margarine, lard, shortening, ghee, or bacon fat. Coconut, palm  kernel, or palm oils. Beverages Alcohol. Sweetened drinks (such as sodas, lemonade, and fruit drinks or punches). The items listed above may not be a complete list of foods and beverages to avoid. Contact your dietitian for more information. This  information is not intended to replace advice given to you by your health care provider. Make sure you discuss any questions you have with your health care provider. Document Released: 10/16/2003 Document Revised: 06/05/2015 Document Reviewed: 11/01/2012 Elsevier Interactive Patient Education  2017 [employer].

## 2017-12-01 ENCOUNTER — Other Ambulatory Visit: Payer: Self-pay | Admitting: Family Medicine

## 2017-12-16 ENCOUNTER — Other Ambulatory Visit: Payer: Self-pay

## 2017-12-16 MED ORDER — FINASTERIDE 5 MG PO TABS: 5.0000 mg | ORAL_TABLET | Freq: Every day | ORAL | 3 refills | Status: DC

## 2018-01-14 ENCOUNTER — Other Ambulatory Visit: Payer: Self-pay | Admitting: Family Medicine

## 2018-01-28 ENCOUNTER — Other Ambulatory Visit: Payer: Self-pay | Admitting: Family Medicine

## 2018-02-14 DIAGNOSIS — M4712 Other spondylosis with myelopathy, cervical region: Secondary | ICD-10-CM | POA: Diagnosis not present

## 2018-02-16 ENCOUNTER — Ambulatory Visit: Payer: Medicare Other | Admitting: Family Medicine

## 2018-02-22 ENCOUNTER — Ambulatory Visit: Payer: Medicare Other | Admitting: Family Medicine

## 2018-03-01 ENCOUNTER — Encounter: Payer: Self-pay | Admitting: Family Medicine

## 2018-03-01 ENCOUNTER — Ambulatory Visit (INDEPENDENT_AMBULATORY_CARE_PROVIDER_SITE_OTHER): Payer: Medicare Other | Admitting: Family Medicine

## 2018-03-01 VITALS — BP 153/85 | HR 94 | Ht 69.0 in | Wt 149.0 lb

## 2018-03-01 DIAGNOSIS — M542 Cervicalgia: Secondary | ICD-10-CM | POA: Diagnosis not present

## 2018-03-01 DIAGNOSIS — R350 Frequency of micturition: Secondary | ICD-10-CM | POA: Diagnosis not present

## 2018-03-01 DIAGNOSIS — E782 Mixed hyperlipidemia: Secondary | ICD-10-CM

## 2018-03-01 DIAGNOSIS — N401 Enlarged prostate with lower urinary tract symptoms: Secondary | ICD-10-CM

## 2018-03-01 DIAGNOSIS — M25561 Pain in right knee: Secondary | ICD-10-CM

## 2018-03-01 DIAGNOSIS — I10 Essential (primary) hypertension: Secondary | ICD-10-CM

## 2018-03-01 DIAGNOSIS — M1731 Unilateral post-traumatic osteoarthritis, right knee: Secondary | ICD-10-CM

## 2018-03-01 DIAGNOSIS — G894 Chronic pain syndrome: Secondary | ICD-10-CM

## 2018-03-01 DIAGNOSIS — G8929 Other chronic pain: Secondary | ICD-10-CM

## 2018-03-01 DIAGNOSIS — R972 Elevated prostate specific antigen [PSA]: Secondary | ICD-10-CM

## 2018-03-01 MED ORDER — DICLOFENAC SODIUM 1 % TD GEL: 4.0000 g | Freq: Four times a day (QID) | TRANSDERMAL | 11 refills | Status: AC

## 2018-03-01 NOTE — Patient Instructions (Addendum)
Thank you for coming in today. Try using the diclofenac gel on the knee.  You should hear from pain management soon.  Let me know if you dont hear anything or have barriers.  Get labs now.  I will get results to you ASAP.    Recheck with me in 3 months.  Return sooner if needed.

## 2018-03-01 NOTE — Progress Notes (Signed)
Jimmy Mayo is a 68 y.o. male who presents to [facility] today for follow-up lipids and elevated PSA as well as anxiety depressive symptoms.  Patient was seen for his wellness exam in November 2019.  During that visit he had significantly elevated triglycerides at 522.  He had previously been intolerant of conventional statins and was wise to start omega-3 fatty acids 2000 mg twice daily.  He was advised to decrease his alcohol intake.  In the interim he has been taking the fatty acids and reduced his alcohol.  Additionally he had elevated PSA velocity at 2.1 up from 1.11 October 2016.  He previously had been prescribed finasteride and was taking it during the time of the visit.  In response to this he is referred to [facility].  He notes that he would like to have lab recheck prior to go to urology.  As noted above additionally he was advised to cut back on alcohol intake.  He had been drinking a bit heavily.  He notes that cut back on alcohol as above.  Additionally he has chronic neck pain.  He has had cervical fusions and has been previously managed by his neurosurgeon Dr. Carloyn [facility] at [facility] in [place].  However Dr. Carloyn Mayo is retiring and he is looking to transition his chronic pain management locally.  He takes tramadol approximately 5 tablets/day for neck pain.  Additionally he exercises regularly and tries to reduce his pain without medicines if possible.  He notes the tramadol does allow him to function.  Additionally [facility] notes right knee pain.  About 3 weeks ago he was stretching and felt a popping sensation in his knee.  He has had anterior knee pain since which is improving.  He has been doing stretching and exercises which are helping quite a bit.  He rates his pain is mild currently.  He notes his pain is mostly anterior and occasionally has a giving way sensation.  He denies  any locking or catching.  He does have a history of meniscus surgery on that knee several years ago.  ROS as above:  Exam:  BP (!) 153/85   Pulse 94   Ht 5\' 9"  (1.753 m)   Wt 149 lb (67.6 kg)   BMI 22.00 kg/m  Wt Readings from Last 5 Encounters:  03/01/18 149 lb (67.6 kg)  11/16/17 146 lb (66.2 kg)  09/07/17 152 lb (68.9 kg)  06/01/17 149 lb (67.6 kg)  03/22/17 148 lb (67.1 kg)    Gen: Well NAD HEENT: EOMI,  MMM Lungs: Normal work of breathing. CTABL Heart: RRR no MRG Abd: NABS, Soft. Nondistended, Nontender Exts: Brisk capillary refill, warm and well perfused.  C-spine: Nontender to spinal midline.  Tender palpation cervical paraspinal musculature decreased cervical motion. Right knee normal-appearing without effusion. Range of motion 0-120 degrees with crepitations.  Nontender lateral and medial joint lines. Tender to palpation just lateral to the distal patella tendon at Hoffa's fat pad. Normal strength in flexion extension.  Stable ligamentous exam. Psych alert and oriented normal speech thought process and affect.   Lab and Radiology Results No results found for this or any previous visit (from the past 72 hour(s)). No results found. Lab Results  Component Value Date   CHOL 200 (H) 11/14/2017   HDL 53 11/14/2017   [place]  11/14/2017     Comment:     . LDL cholesterol not calculated. Triglyceride levels greater than  400 mg/dL invalidate calculated LDL results. . Reference range: <100 . Desirable range <100 mg/dL for primary prevention;   <70 mg/dL for patients with CHD or diabetic patients  with > or = 2 CHD risk factors. [name] LDL-C is now calculated using the [name]  calculation, which is a validated novel method providing  better accuracy than the Friedewald equation in the  estimation of LDL-C.  Cresenciano Genre et al. Annamaria Helling. 4010;272(53): 2061-2068  (http://education.[employer].com/faq/FAQ164)    LDLDIRECT 63 10/18/2016   TRIG 522 (H) 11/14/2017    CHOLHDL 3.8 11/14/2017     Chemistry      Component Value Date/Time   NA 139 11/14/2017 1057   K 4.3 11/14/2017 1057   CL 99 11/14/2017 1057   CO2 32 11/14/2017 1057   BUN 15 11/14/2017 1057   CREATININE 0.83 11/14/2017 1057      Component Value Date/Time   CALCIUM 10.0 11/14/2017 1057   ALKPHOS 66 10/14/2015 1111   AST 29 11/14/2017 1057   ALT 25 11/14/2017 1057   BILITOT 0.5 11/14/2017 1057        Assessment and Plan: 68 y.o. male with  Hyperlipidemia: Clinically doing well however will recheck lipid panel today and reassess next steps.  Elevated PSA.  Plan to recheck PSA if still elevated or increasing will re-again refer to urology.  If decreasing will continue to monitor.  Knee pain likely exacerbation of DJD versus tendinitis.  Trial of diclofenac gel as patient is improving.  Recheck if not improving.  Chronic neck pain: Doing really well with tramadol and exercises.  Unfortunately he is going to have to switch providers for pain management and is looking for a new provider.  Discussed that this is likely going to be of knowing or obnoxious.  New referral placed. Discussed continued alcohol reduction or abstinence.  PDMP reviewed during this encounter. Orders Placed This Encounter  Procedures  . CBC  . COMPLETE METABOLIC PANEL WITH GFR  . PSA  . Lipid Panel w/reflex Direct LDL  . Ambulatory referral to Pain Clinic    Referral Priority:   Routine    Referral Type:   Consultation    Referral Reason:   Specialty Services Required    Requested Specialty:   Pain Medicine    Number of Visits Requested:   1   Meds ordered this encounter  Medications  . diclofenac sodium (VOLTAREN) 1 % GEL    Sig: Apply 4 g topically 4 (four) times daily. To affected joint.    Dispense:  100 g    Refill:  11    Knee OA. Tried oral ibuprofen and aleve cannot tolerate high oral dose NSAID     Historical information moved to improve visibility of documentation.  Past Medical  History:  Diagnosis Date  . Anemia   . Anxiety   . Asthma   . Back pain   . Chronic kidney disease   . GERD (gastroesophageal reflux disease)   . Kidney stone   . Rupture long head biceps tendon 03/22/2017   Past Surgical History:  Procedure Laterality Date  . COLONOSCOPY W/ BIOPSIES AND POLYPECTOMY     was to have 1 this past year but planing to see new GI in [place] realize he may need onne every 5 years  . HYDROCELE EXCISION / REPAIR     x3 or 2 on 1 and 1 on the other  . KNEE ARTHROSCOPY     1986  . LITHOTRIPSY    .  NASAL SINUS SURGERY    . SPINAL FUSION    . SPINE SURGERY  2006   l spine fusion/rods  . VASECTOMY     Social History   Tobacco Use  . Smoking status: Current Every Day Smoker    Packs/day: 1.50    Years: 35.00    Pack years: 52.50  . Smokeless tobacco: Never Used  Substance Use Topics  . Alcohol use: Yes   family history includes Cancer in his father and mother; Diabetes in his father and maternal grandmother; Prostate cancer in his father.  Medications: Current Outpatient Medications  Medication Sig Dispense Refill  . allopurinol (ZYLOPRIM) 300 MG tablet Take 1 tablet (300 mg total) by mouth 2 (two) times daily. 180 tablet 3  . ALPRAZolam (XANAX) 0.5 MG tablet Take 1 tablet (0.5 mg total) by mouth 3 (three) times daily as needed. 90 tablet 5  . Ascorbic Acid (VITAMIN C PO) Take by mouth.    [name] azelastine (ASTELIN) 0.1 % nasal spray Place 2 sprays into both nostrils 2 (two) times daily. Use in each nostril as directed 30 mL 12  . CINNAMON PO Take by mouth.    . ECHINACEA PO Take by mouth.    . esomeprazole (NEXIUM) 40 MG capsule Take 40 mg by mouth daily at 12 noon.    . finasteride (PROSCAR) 5 MG tablet Take 1 tablet (5 mg total) by mouth daily. 90 tablet 3  . Multiple Vitamin (MULTIVITAMIN) capsule Take 1 capsule by mouth daily.      . Omega-3 1000 MG CAPS Take 2,000 mg by mouth 2 (two) times daily.    [name] PROAIR HFA 108 (90 Base) MCG/ACT inhaler  INHALE 2 PUFFS BY MOUTH EVERY 6 HOURS AS NEEDED 17 g 1  . traMADol (ULTRAM) 50 MG tablet Take 50 mg by mouth. Take 1-2 tabs by mouth every 8 hours    . valsartan-hydrochlorothiazide (DIOVAN-HCT) 160-12.5 MG tablet TAKE 1 TABLET BY MOUTH DAILY. 90 tablet 0  . zolpidem (AMBIEN) 10 MG tablet TAKE 1 TABLET BY MOUTH EVERY NIGHT AT BEDTIME AS NEEDED FOR SLEEP. 90 tablet 0  . diclofenac sodium (VOLTAREN) 1 % GEL Apply 4 g topically 4 (four) times daily. To affected joint. 100 g 11   No current facility-administered medications for this visit.    Allergies  Allergen Reactions  . Amoxicillin   . Niacin And Related     Flushing   . Penicillins   . Pravastatin Sodium     REACTION: diarrhea  . Zetia [Ezetimibe]     constipation     Discussed warning signs or symptoms. Please see discharge instructions. Patient expresses understanding.

## 2018-03-02 LAB — PSA: PSA: 2.5 ng/mL (ref <=4.0)

## 2018-03-02 LAB — COMPLETE METABOLIC PANEL WITH GFR
AG Ratio: 2 (calc) (ref 1.0–2.5)
ALT: 35 U/L (ref 9–46)
AST: 34 U/L (ref 10–35)
Albumin: 4.6 g/dL (ref 3.6–5.1)
Alkaline phosphatase (APISO): 58 U/L (ref 35–144)
BUN: 13 mg/dL (ref 7–25)
CO2: 33 mmol/L — AB (ref 20–32)
Calcium: 10.5 mg/dL — ABNORMAL HIGH (ref 8.6–10.3)
Chloride: 98 mmol/L (ref 98–110)
Creat: 0.79 mg/dL (ref 0.70–1.25)
GFR, Est African American: 108 mL/min/{1.73_m2} (ref >=60)
GFR, Est Non African American: 94 mL/min/{1.73_m2} (ref >=60)
GLOBULIN: 2.3 g/dL (ref 1.9–3.7)
Glucose, Bld: 97 mg/dL (ref 65–99)
Potassium: 4.4 mmol/L (ref 3.5–5.3)
Sodium: 141 mmol/L (ref 135–146)
Total Bilirubin: 0.9 mg/dL (ref 0.2–1.2)
Total Protein: 6.9 g/dL (ref 6.1–8.1)

## 2018-03-02 LAB — CBC
HCT: 45 % (ref 38.5–50.0)
Hemoglobin: 15.9 g/dL (ref 13.2–17.1)
MCH: 33.1 pg — ABNORMAL HIGH (ref 27.0–33.0)
MCHC: 35.3 g/dL (ref 32.0–36.0)
MCV: 93.6 fL (ref 80.0–100.0)
MPV: 12 fL (ref 7.5–12.5)
Platelets: 257 10*3/uL (ref 140–400)
RBC: 4.81 10*6/uL (ref 4.20–5.80)
RDW: 13.3 % (ref 11.0–15.0)
WBC: 7.9 10*3/uL (ref 3.8–10.8)

## 2018-03-02 LAB — LIPID PANEL W/REFLEX DIRECT LDL
Cholesterol: 179 mg/dL (ref <200)
HDL: 80 mg/dL (ref >=40)
LDL Cholesterol (Calc): 80 mg/dL (calc)
Non-HDL Cholesterol (Calc): 99 mg/dL (calc) (ref <130)
Total CHOL/HDL Ratio: 2.2 (calc) (ref <5.0)
Triglycerides: 104 mg/dL (ref <150)

## 2018-03-03 NOTE — Addendum Note (Signed)
Addended by: Gregor Hams on: 03/03/2018 08:39 AM   Modules accepted: Orders

## 2018-03-05 ENCOUNTER — Other Ambulatory Visit: Payer: Self-pay | Admitting: Family Medicine

## 2018-03-28 DIAGNOSIS — H33011 Retinal detachment with single break, right eye: Secondary | ICD-10-CM | POA: Diagnosis not present

## 2018-03-29 DIAGNOSIS — H33021 Retinal detachment with multiple breaks, right eye: Secondary | ICD-10-CM | POA: Diagnosis not present

## 2018-03-29 DIAGNOSIS — J449 Chronic obstructive pulmonary disease, unspecified: Secondary | ICD-10-CM | POA: Diagnosis not present

## 2018-03-29 DIAGNOSIS — I10 Essential (primary) hypertension: Secondary | ICD-10-CM | POA: Diagnosis not present

## 2018-03-29 DIAGNOSIS — Z880 Allergy status to penicillin: Secondary | ICD-10-CM | POA: Diagnosis not present

## 2018-03-29 DIAGNOSIS — Z79899 Other long term (current) drug therapy: Secondary | ICD-10-CM | POA: Diagnosis not present

## 2018-03-29 DIAGNOSIS — M109 Gout, unspecified: Secondary | ICD-10-CM | POA: Diagnosis not present

## 2018-03-29 DIAGNOSIS — Z888 Allergy status to other drugs, medicaments and biological substances status: Secondary | ICD-10-CM | POA: Diagnosis not present

## 2018-03-29 DIAGNOSIS — H43813 Vitreous degeneration, bilateral: Secondary | ICD-10-CM | POA: Diagnosis not present

## 2018-03-29 DIAGNOSIS — H35372 Puckering of macula, left eye: Secondary | ICD-10-CM | POA: Diagnosis not present

## 2018-03-29 DIAGNOSIS — H3321 Serous retinal detachment, right eye: Secondary | ICD-10-CM | POA: Diagnosis not present

## 2018-03-31 ENCOUNTER — Telehealth: Payer: Self-pay | Admitting: Family Medicine

## 2018-03-31 ENCOUNTER — Encounter: Payer: Self-pay | Admitting: Family Medicine

## 2018-03-31 DIAGNOSIS — H43813 Vitreous degeneration, bilateral: Secondary | ICD-10-CM

## 2018-03-31 DIAGNOSIS — H3321 Serous retinal detachment, right eye: Secondary | ICD-10-CM

## 2018-03-31 DIAGNOSIS — H3320 Serous retinal detachment, unspecified eye: Secondary | ICD-10-CM

## 2018-03-31 HISTORY — DX: Serous retinal detachment, unspecified eye: H33.20

## 2018-03-31 HISTORY — DX: Vitreous degeneration, bilateral: H43.813

## 2018-03-31 NOTE — Telephone Encounter (Signed)
Received note from [facility] retina specialist.  Date of service 03/29/2018.  Patient has vitreous detachment bilaterally and right-sided retinal detachment.  Patient will receive scleral buckle, vitrectomy and gas fluid exchange in the right eye.

## 2018-04-05 DIAGNOSIS — H33021 Retinal detachment with multiple breaks, right eye: Secondary | ICD-10-CM | POA: Diagnosis not present

## 2018-04-05 DIAGNOSIS — H35372 Puckering of macula, left eye: Secondary | ICD-10-CM | POA: Diagnosis not present

## 2018-04-19 ENCOUNTER — Other Ambulatory Visit: Payer: Self-pay | Admitting: Family Medicine

## 2018-04-27 ENCOUNTER — Other Ambulatory Visit: Payer: Self-pay | Admitting: Family Medicine

## 2018-05-04 DIAGNOSIS — H35373 Puckering of macula, bilateral: Secondary | ICD-10-CM | POA: Diagnosis not present

## 2018-05-10 ENCOUNTER — Other Ambulatory Visit: Payer: Self-pay | Admitting: Family Medicine

## 2018-05-19 ENCOUNTER — Other Ambulatory Visit: Payer: Self-pay | Admitting: Family Medicine

## 2018-05-19 NOTE — Telephone Encounter (Signed)
Last RF sent 11/16/17 for #90 with 5 RF  Last OV 03/01/18  Next OV 05/30/18  RX pended

## 2018-05-23 DIAGNOSIS — M545 Low back pain: Secondary | ICD-10-CM | POA: Diagnosis not present

## 2018-05-23 DIAGNOSIS — M961 Postlaminectomy syndrome, not elsewhere classified: Secondary | ICD-10-CM | POA: Diagnosis not present

## 2018-05-23 DIAGNOSIS — G8929 Other chronic pain: Secondary | ICD-10-CM | POA: Diagnosis not present

## 2018-05-23 DIAGNOSIS — M542 Cervicalgia: Secondary | ICD-10-CM | POA: Diagnosis not present

## 2018-05-23 DIAGNOSIS — M47812 Spondylosis without myelopathy or radiculopathy, cervical region: Secondary | ICD-10-CM | POA: Diagnosis not present

## 2018-05-24 ENCOUNTER — Telehealth: Payer: Self-pay | Admitting: Family Medicine

## 2018-05-24 NOTE — Telephone Encounter (Signed)
Pt called and requested the name of the pain clinic [facility] recommended. According to chart He was referred to [facility], Referral has been closed due to patient not contacting them to schedule when they called.  Will notify patient.

## 2018-05-30 ENCOUNTER — Ambulatory Visit: Payer: Medicare Other | Admitting: Family Medicine

## 2018-06-09 ENCOUNTER — Other Ambulatory Visit: Payer: Self-pay | Admitting: Family Medicine

## 2018-06-12 DIAGNOSIS — H35373 Puckering of macula, bilateral: Secondary | ICD-10-CM | POA: Diagnosis not present

## 2018-07-18 ENCOUNTER — Other Ambulatory Visit: Payer: Self-pay | Admitting: Family Medicine

## 2018-07-26 ENCOUNTER — Other Ambulatory Visit: Payer: Self-pay | Admitting: Family Medicine

## 2018-07-27 ENCOUNTER — Other Ambulatory Visit: Payer: Self-pay | Admitting: Family Medicine

## 2018-07-27 ENCOUNTER — Other Ambulatory Visit: Payer: Self-pay

## 2018-07-27 MED ORDER — ALBUTEROL SULFATE HFA 108 (90 BASE) MCG/ACT IN AERS: 2.0000 | INHALATION_SPRAY | Freq: Four times a day (QID) | RESPIRATORY_TRACT | 0 refills | Status: DC | PRN

## 2018-07-27 MED ORDER — VALSARTAN-HYDROCHLOROTHIAZIDE 160-12.5 MG PO TABS: 1.0000 | ORAL_TABLET | Freq: Every day | ORAL | 0 refills | Status: DC

## 2018-08-10 DIAGNOSIS — H35373 Puckering of macula, bilateral: Secondary | ICD-10-CM | POA: Diagnosis not present

## 2018-08-10 DIAGNOSIS — H43812 Vitreous degeneration, left eye: Secondary | ICD-10-CM | POA: Diagnosis not present

## 2018-08-10 DIAGNOSIS — T85398A Other mechanical complication of other ocular prosthetic devices, implants and grafts, initial encounter: Secondary | ICD-10-CM | POA: Diagnosis not present

## 2018-08-19 DIAGNOSIS — H35371 Puckering of macula, right eye: Secondary | ICD-10-CM | POA: Diagnosis not present

## 2018-08-19 DIAGNOSIS — Z01812 Encounter for preprocedural laboratory examination: Secondary | ICD-10-CM | POA: Diagnosis not present

## 2018-08-23 ENCOUNTER — Other Ambulatory Visit: Payer: Self-pay | Admitting: Family Medicine

## 2018-08-23 DIAGNOSIS — Z9842 Cataract extraction status, left eye: Secondary | ICD-10-CM | POA: Diagnosis not present

## 2018-08-23 DIAGNOSIS — Z880 Allergy status to penicillin: Secondary | ICD-10-CM | POA: Diagnosis not present

## 2018-08-23 DIAGNOSIS — T85398D Other mechanical complication of other ocular prosthetic devices, implants and grafts, subsequent encounter: Secondary | ICD-10-CM | POA: Diagnosis not present

## 2018-08-23 DIAGNOSIS — Z9841 Cataract extraction status, right eye: Secondary | ICD-10-CM | POA: Diagnosis not present

## 2018-08-23 DIAGNOSIS — I10 Essential (primary) hypertension: Secondary | ICD-10-CM | POA: Diagnosis not present

## 2018-08-23 DIAGNOSIS — Z961 Presence of intraocular lens: Secondary | ICD-10-CM | POA: Diagnosis not present

## 2018-08-23 DIAGNOSIS — H35371 Puckering of macula, right eye: Secondary | ICD-10-CM | POA: Diagnosis not present

## 2018-08-23 DIAGNOSIS — Z79899 Other long term (current) drug therapy: Secondary | ICD-10-CM | POA: Diagnosis not present

## 2018-08-23 DIAGNOSIS — J449 Chronic obstructive pulmonary disease, unspecified: Secondary | ICD-10-CM | POA: Diagnosis not present

## 2018-09-12 ENCOUNTER — Other Ambulatory Visit: Payer: Self-pay | Admitting: Family Medicine

## 2018-09-18 ENCOUNTER — Other Ambulatory Visit: Payer: Self-pay | Admitting: Family Medicine

## 2018-09-19 ENCOUNTER — Other Ambulatory Visit: Payer: Self-pay | Admitting: Family Medicine

## 2018-09-19 ENCOUNTER — Ambulatory Visit (INDEPENDENT_AMBULATORY_CARE_PROVIDER_SITE_OTHER): Payer: Medicare Other | Admitting: Family Medicine

## 2018-09-19 ENCOUNTER — Other Ambulatory Visit: Payer: Self-pay

## 2018-09-19 ENCOUNTER — Encounter: Payer: Self-pay | Admitting: Family Medicine

## 2018-09-19 VITALS — BP 128/84 | HR 99 | Temp 98.0°F | Wt 142.0 lb

## 2018-09-19 DIAGNOSIS — Z23 Encounter for immunization: Secondary | ICD-10-CM

## 2018-09-19 DIAGNOSIS — N401 Enlarged prostate with lower urinary tract symptoms: Secondary | ICD-10-CM

## 2018-09-19 DIAGNOSIS — R972 Elevated prostate specific antigen [PSA]: Secondary | ICD-10-CM | POA: Diagnosis not present

## 2018-09-19 DIAGNOSIS — H43813 Vitreous degeneration, bilateral: Secondary | ICD-10-CM

## 2018-09-19 DIAGNOSIS — G894 Chronic pain syndrome: Secondary | ICD-10-CM

## 2018-09-19 DIAGNOSIS — R350 Frequency of micturition: Secondary | ICD-10-CM

## 2018-09-19 DIAGNOSIS — E782 Mixed hyperlipidemia: Secondary | ICD-10-CM | POA: Diagnosis not present

## 2018-09-19 LAB — PSA: PSA: 2.3 ng/mL (ref <=4.0)

## 2018-09-19 MED ORDER — ALBUTEROL SULFATE HFA 108 (90 BASE) MCG/ACT IN AERS: 2.0000 | INHALATION_SPRAY | Freq: Four times a day (QID) | RESPIRATORY_TRACT | 6 refills | Status: DC | PRN

## 2018-09-19 MED ORDER — GABAPENTIN 300 MG PO CAPS: ORAL_CAPSULE | ORAL | 3 refills | Status: DC

## 2018-09-19 MED ORDER — ZOLPIDEM TARTRATE 10 MG PO TABS: ORAL_TABLET | ORAL | 0 refills | Status: DC

## 2018-09-19 MED ORDER — PNEUMOCOCCAL 13-VAL CONJ VACC IM SUSP
0.5000 mL | Freq: Once | INTRAMUSCULAR | Status: AC
  Administered 2018-09-19: 0.5 mL via INTRAMUSCULAR

## 2018-09-19 MED ORDER — MELOXICAM 15 MG PO TABS: 15.0000 mg | ORAL_TABLET | Freq: Every day | ORAL | 1 refills | Status: DC

## 2018-09-19 NOTE — Patient Instructions (Addendum)
Thank you for coming in today. Get lab today to check PSA.  Continue healthy lifestyle.    Start gabapentin. OK to increase from bedtime to 3x daily. Max dose is 3pills 3x daily.  Ok to take meloxicam daily for pain. This is like ibuprofen.   I will be moving to full time Sports Medicine in [place] starting on November 1st.  You will still be able to see me for your Sports Medicine or Orthopedic needs at [facility] in [place]. I will still be part of [place].    If you want to stay locally for your Sports Medicine issues Dr. Dianah Field here in [place] will be happy to see you.  Additionally Dr. Clearance Coots at [facility] will be happy to see you for sports medicine issues more locally.   For your primary care needs you are welcome to establish care with Dr. Emeterio Reeve.  We are working quickly to hire more physicians to cover the primary care needs however if you cannot get an appointment with Dr. Sheppard Coil in a timely manner [facility] has locations and openings for primary care services nearby.   [facility] at [facility] [address] . [employer] , [place]: [phone] . Behavioral Medicine: [phone] . Fax: [place] at [employer] [address] . [place], [place]: [phone] . Behavioral Medicine: [phone] . Fax: [phone] . Hours (M-F): 7am - Academic librarian At [facility] [place], [place]: [phone] . Behavioral Medicine: [phone] . Fax: [phone] . Hours (M-F): 8am - Optician, dispensing at [employer] . [place], [place] Phone: [phone] . Behavioral Medicine: [phone] . Fax: [phone]

## 2018-09-19 NOTE — Progress Notes (Signed)
[facility] is a 68 y.o. male who presents to [facility]: [facility] today for follow up:  Lipids.  Patient has history of elevated triglycerides intolerant to conventional statins.  Was started on omega-3 fatty acids 2000 twice daily and decreased alcohol intake.  He continues current medications.  Additionally elevated PSA velocity on finasteride was referred to urology.  This was delayed because of recurrent eye issue.  He feels well with no further issues currently.    Retinal detachment: Patient has had retinal detachment and vitreous detachment has had multiple eye procedures.  His vision is preserved but has had to stop exercising as result.  He thinks he will be able to exercise again soon as he recently had another procedure which he thinks should be the last one.  Chronic pain: Doing well with tramadol.  Unfortunately had to switch pain management specialists. Was taking 5 tramadol per day down to 2 per day now.  He is interested in trying gabapentin and meloxicam as he is hopeful that may help.    ROS as above:  Exam:  BP 128/84   Pulse 99   Temp 98 F (36.7 C) (Oral)   Wt 142 lb (64.4 kg)   BMI 20.97 kg/m  Wt Readings from Last 5 Encounters:  09/19/18 142 lb (64.4 kg)  03/01/18 149 lb (67.6 kg)  11/16/17 146 lb (66.2 kg)  09/07/17 152 lb (68.9 kg)  06/01/17 149 lb (67.6 kg)    Gen: Well NAD HEENT: EOMI,  MMM Lungs: Normal work of breathing. CTABL Heart: RRR no MRG Abd: NABS, Soft. Nondistended, Nontender Exts: Brisk capillary refill, warm and well perfused.  Mood: Alert and oriented normal speech thought process and affect.  Depression screen [facility] 2/9 09/19/2018 03/01/2018 11/16/2017 11/04/2016 09/02/2016  Decreased Interest 1 1 1 1 1   Down, Depressed, Hopeless 1 1 1 2  -  PHQ - 2 Score 2 2 2 3 1   Altered sleeping 1 1 2  0 -  Tired, decreased energy 1 1 2  2  -  Change in appetite 1 1 2 1  -  Feeling bad or failure about yourself  0 1 1 1  -  Trouble concentrating 1 0 1 1 -  Moving slowly or fidgety/restless 1 0 0 1 -  Suicidal thoughts 0 0 0 0 -  PHQ-9 Score 7 6 10 9  -  Difficult doing work/chores Not difficult at all Somewhat difficult Somewhat difficult - -   GAD 7 : Generalized Anxiety Score 09/19/2018 11/16/2017 11/04/2016 04/01/2016  Nervous, Anxious, on Edge 1 1 2 1   Control/stop worrying 1 1 1 2   Worry too much - different things 1 1 1 1   Trouble relaxing 1 1 2 1   Restless 1 1 2 1   Easily annoyed or irritable 1 0 2 1  Afraid - awful might happen 0 0 2 0  Total GAD 7 Score 6 5 12 7   Anxiety Difficulty Not difficult at all Somewhat difficult - -     Lab and Radiology Results    Assessment and Plan: 68 y.o. male with  Lipids: Doing well continue current regimen.  Pain: Continue to wean tramadol.  We will try gabapentin and meloxicam.  PSA velocity: Recheck PSA.  If continue to increase refer to urology.  On finasteride.  Retinal detachment: Stable managed with ophthalmology.  Administer influenza and Prevnar 13 vaccine today.  I informed patient that I am transitioning to sports medicine  only [facility] in [place] starting in November.  Happy to see patient for continued sports medicine needs.  Discussed need for new PCP.  Provided some recommendations.   PDMP reviewed during this encounter. Orders Placed This Encounter  Procedures  . Flu Vaccine QUAD High Dose(Fluad)  . PSA   Meds ordered this encounter  Medications  . gabapentin (NEURONTIN) 300 MG capsule    Sig: One tab PO qHS for a week, then BID for a week, then TID. May double weekly to a max of 3,600mg /day    Dispense:  180 capsule    Refill:  3  . meloxicam (MOBIC) 15 MG tablet    Sig: Take 1 tablet (15 mg total) by mouth daily.    Dispense:  90 tablet    Refill:  1  . albuterol (PROAIR HFA) 108 (90 Base) MCG/ACT inhaler    Sig: Inhale 2  puffs into the lungs every 6 (six) hours as needed.    Dispense:  17 g    Refill:  6    Ok to dispense preferred brand.  [name] zolpidem (AMBIEN) 10 MG tablet    Sig: TAKE 1 TABLET BY MOUTH EVERY NIGHT AT BEDTIME AS NEEDED FOR SLEEP    Dispense:  90 tablet    Refill:  0  . pneumococcal 13-valent conjugate vaccine (PREVNAR 13) injection 0.5 mL     Historical information moved to improve visibility of documentation.  Past Medical History:  Diagnosis Date  . Anemia   . Anxiety   . Asthma   . Back pain   . Chronic kidney disease   . GERD (gastroesophageal reflux disease)   . Kidney stone   . Retinal detachment 03/31/2018   Rt eye 03/29/18  . Rupture long head biceps tendon 03/22/2017  . Vitreous detachment of both eyes 03/31/2018   03/29/18   Past Surgical History:  Procedure Laterality Date  . COLONOSCOPY W/ BIOPSIES AND POLYPECTOMY     was to have 1 this past year but planing to see new GI in [place] realize he may need onne every 5 years  . HYDROCELE EXCISION / REPAIR     x3 or 2 on 1 and 1 on the other  . KNEE ARTHROSCOPY     1986  . LITHOTRIPSY    . NASAL SINUS SURGERY    . SPINAL FUSION    . SPINE SURGERY  2006   l spine fusion/rods  . VASECTOMY     Social History   Tobacco Use  . Smoking status: Current Every Day Smoker    Packs/day: 1.50    Years: 35.00    Pack years: 52.50  . Smokeless tobacco: Never Used  Substance Use Topics  . Alcohol use: Yes   family history includes Cancer in his father and mother; Diabetes in his father and maternal grandmother; Prostate cancer in his father.  Medications: Current Outpatient Medications  Medication Sig Dispense Refill  . albuterol (PROAIR HFA) 108 (90 Base) MCG/ACT inhaler Inhale 2 puffs into the lungs every 6 (six) hours as needed. 17 g 6  . allopurinol (ZYLOPRIM) 300 MG tablet Take 1 tablet (300 mg total) by mouth 2 (two) times daily. 180 tablet 3  . ALPRAZolam (XANAX) 0.5 MG tablet TAKE 1 TABLET(0.5 MG) BY MOUTH  THREE TIMES DAILY AS NEEDED 90 tablet 1  . Ascorbic Acid (VITAMIN C PO) Take by mouth.    [name] azelastine (ASTELIN) 0.1 % nasal spray Place 2 sprays into both nostrils 2 (  two) times daily. Use in each nostril as directed 30 mL 12  . CINNAMON PO Take by mouth.    . diclofenac sodium (VOLTAREN) 1 % GEL Apply 4 g topically 4 (four) times daily. To affected joint. 100 g 11  . ECHINACEA PO Take by mouth.    . esomeprazole (NEXIUM) 40 MG capsule Take 40 mg by mouth daily at 12 noon.    . finasteride (PROSCAR) 5 MG tablet Take 1 tablet (5 mg total) by mouth daily. 90 tablet 3  . Multiple Vitamin (MULTIVITAMIN) capsule Take 1 capsule by mouth daily.      . Omega-3 1000 MG CAPS Take 2,000 mg by mouth 2 (two) times daily.    . traMADol (ULTRAM) 50 MG tablet Take 50 mg by mouth. Take 1-2 tabs by mouth every 8 hours    . valsartan-hydrochlorothiazide (DIOVAN-HCT) 160-12.5 MG tablet TAKE 1 TABLET BY MOUTH DAILY 90 tablet 0  . zolpidem (AMBIEN) 10 MG tablet TAKE 1 TABLET BY MOUTH EVERY NIGHT AT BEDTIME AS NEEDED FOR SLEEP 90 tablet 0  . gabapentin (NEURONTIN) 300 MG capsule One tab PO qHS for a week, then BID for a week, then TID. May double weekly to a max of 3,600mg /day 180 capsule 3  . meloxicam (MOBIC) 15 MG tablet Take 1 tablet (15 mg total) by mouth daily. 90 tablet 1   No current facility-administered medications for this visit.    Allergies  Allergen Reactions  . Amoxicillin   . Niacin And Related     Flushing   . Penicillins   . Pravastatin Sodium     REACTION: diarrhea  . Zetia [Ezetimibe]     constipation     Discussed warning signs or symptoms. Please see discharge instructions. Patient expresses understanding.

## 2018-10-11 DIAGNOSIS — H35372 Puckering of macula, left eye: Secondary | ICD-10-CM | POA: Diagnosis not present

## 2018-10-18 ENCOUNTER — Other Ambulatory Visit: Payer: Self-pay | Admitting: Family Medicine

## 2018-11-17 DIAGNOSIS — H02831 Dermatochalasis of right upper eyelid: Secondary | ICD-10-CM | POA: Diagnosis not present

## 2018-11-17 DIAGNOSIS — H26492 Other secondary cataract, left eye: Secondary | ICD-10-CM | POA: Diagnosis not present

## 2018-11-17 DIAGNOSIS — H33011 Retinal detachment with single break, right eye: Secondary | ICD-10-CM | POA: Diagnosis not present

## 2018-11-17 DIAGNOSIS — H527 Unspecified disorder of refraction: Secondary | ICD-10-CM | POA: Diagnosis not present

## 2018-11-17 DIAGNOSIS — H40003 Preglaucoma, unspecified, bilateral: Secondary | ICD-10-CM | POA: Diagnosis not present

## 2018-11-17 DIAGNOSIS — H35373 Puckering of macula, bilateral: Secondary | ICD-10-CM | POA: Diagnosis not present

## 2018-11-24 ENCOUNTER — Other Ambulatory Visit: Payer: Self-pay | Admitting: Family Medicine

## 2018-11-29 ENCOUNTER — Other Ambulatory Visit: Payer: Self-pay | Admitting: *Deleted

## 2018-11-29 MED ORDER — VALSARTAN-HYDROCHLOROTHIAZIDE 160-12.5 MG PO TABS: 1.0000 | ORAL_TABLET | Freq: Every day | ORAL | 0 refills | Status: DC

## 2018-12-12 ENCOUNTER — Other Ambulatory Visit: Payer: Self-pay | Admitting: Family Medicine

## 2018-12-15 ENCOUNTER — Other Ambulatory Visit: Payer: Self-pay | Admitting: Family Medicine

## 2018-12-22 DIAGNOSIS — H35373 Puckering of macula, bilateral: Secondary | ICD-10-CM | POA: Diagnosis not present

## 2018-12-22 DIAGNOSIS — H33011 Retinal detachment with single break, right eye: Secondary | ICD-10-CM | POA: Diagnosis not present

## 2018-12-22 DIAGNOSIS — H02831 Dermatochalasis of right upper eyelid: Secondary | ICD-10-CM | POA: Diagnosis not present

## 2018-12-22 DIAGNOSIS — H26492 Other secondary cataract, left eye: Secondary | ICD-10-CM | POA: Diagnosis not present

## 2018-12-22 DIAGNOSIS — H40003 Preglaucoma, unspecified, bilateral: Secondary | ICD-10-CM | POA: Diagnosis not present

## 2019-01-16 ENCOUNTER — Other Ambulatory Visit: Payer: Self-pay | Admitting: Sports Medicine

## 2019-01-16 MED ORDER — ZOLPIDEM TARTRATE 10 MG PO TABS: ORAL_TABLET | ORAL | 1 refills | Status: DC

## 2019-01-31 DIAGNOSIS — H43812 Vitreous degeneration, left eye: Secondary | ICD-10-CM | POA: Diagnosis not present

## 2019-01-31 DIAGNOSIS — H35372 Puckering of macula, left eye: Secondary | ICD-10-CM | POA: Diagnosis not present

## 2019-02-28 ENCOUNTER — Telehealth: Payer: Self-pay

## 2019-02-28 ENCOUNTER — Ambulatory Visit (INDEPENDENT_AMBULATORY_CARE_PROVIDER_SITE_OTHER): Payer: Medicare Other | Admitting: Family Medicine

## 2019-02-28 ENCOUNTER — Other Ambulatory Visit: Payer: Self-pay

## 2019-02-28 ENCOUNTER — Encounter: Payer: Self-pay | Admitting: Family Medicine

## 2019-02-28 VITALS — BP 131/87 | HR 98 | Temp 98.2°F | Ht 69.0 in | Wt 138.0 lb

## 2019-02-28 DIAGNOSIS — R1013 Epigastric pain: Secondary | ICD-10-CM

## 2019-02-28 DIAGNOSIS — G894 Chronic pain syndrome: Secondary | ICD-10-CM

## 2019-02-28 MED ORDER — PANTOPRAZOLE SODIUM 40 MG PO TBEC: 40.0000 mg | DELAYED_RELEASE_TABLET | Freq: Two times a day (BID) | ORAL | 1 refills | Status: DC

## 2019-02-28 MED ORDER — TRAMADOL HCL 50 MG PO TABS: 50.0000 mg | ORAL_TABLET | Freq: Three times a day (TID) | ORAL | 0 refills | Status: DC | PRN

## 2019-02-28 MED ORDER — ONDANSETRON HCL 4 MG PO TABS: 4.0000 mg | ORAL_TABLET | Freq: Three times a day (TID) | ORAL | 0 refills | Status: DC | PRN

## 2019-02-28 NOTE — Assessment & Plan Note (Signed)
Pain in back is chronic, possibly having some referred pain from the abdomen.  Will avoid nsaids for now given possibility of gastritis.

## 2019-02-28 NOTE — Telephone Encounter (Signed)
Sig should be 1 every 8 hours.

## 2019-02-28 NOTE — Patient Instructions (Signed)
Nice to meet you today Stop downstairs and have labs completed today.  We'll be in touch with results.  Take pantoprazole 40mg  twice daily.  You may use zofran for nausea.  Let me know if symptoms are not improving over the next couple of weeks.  If having worsening symptoms let me know sooner.

## 2019-02-28 NOTE — Telephone Encounter (Signed)
Pharmacy advised  

## 2019-02-28 NOTE — Telephone Encounter (Signed)
[facility] wanted to confirm the directions for Tramadol. 1 every 8 hours or 1-2 every 8 hours.

## 2019-02-28 NOTE — Assessment & Plan Note (Signed)
DDx includes gastritis, renal stone and PUD Check LFT's, UA and CBC today.  Will increase PPI to bid dosing.  Zofran for nausea Will renew tramadol for now.  If not improving will plan to obtain CT abdomen/pelvis.

## 2019-02-28 NOTE — Progress Notes (Signed)
Jimmy Mayo - 69 y.o. male MRN [id number]  Date of birth: [birth year]  Subjective Chief Complaint  Patient presents with  . Abdominal Pain    HPI GRAM CAPLE is a 69 y.o. male with history of HTN, BPH, DDD with chronic low back pain, and GERD here today with complaint of abdominal and back pain.  Pain is located in epigastric area.  He has had associated nausea.  Appetites is slightly decreased.   He also has some mid back pain.  Pain does not radiate.  He is taking tramadol as needed.  He has been taking OTC ibuprofen as well.  He denies urinary symptoms, dark stool, changes to bowel habits, fever, chills.  ROS:  A comprehensive ROS was completed and negative except as noted per HPI  Allergies  Allergen Reactions  . Amoxicillin   . Niacin And Related Other (See Comments)    Flushing   . Penicillins   . Pravastatin Sodium Other (See Comments)    REACTION: diarrhea  . Zetia [Ezetimibe]     constipation    Past Medical History:  Diagnosis Date  . Anemia   . Anxiety   . Asthma   . Back pain   . Chronic kidney disease   . GERD (gastroesophageal reflux disease)   . Kidney stone   . Retinal detachment 03/31/2018   Rt eye 03/29/18  . Rupture long head biceps tendon 03/22/2017  . Vitreous detachment of both eyes 03/31/2018   03/29/18    Past Surgical History:  Procedure Laterality Date  . COLONOSCOPY W/ BIOPSIES AND POLYPECTOMY     was to have 1 this past year but planing to see new GI in [place] realize he may need onne every 5 years  . HYDROCELE EXCISION / REPAIR     x3 or 2 on 1 and 1 on the other  . KNEE ARTHROSCOPY     1986  . LITHOTRIPSY    . NASAL SINUS SURGERY    . SPINAL FUSION    . SPINE SURGERY  2006   l spine fusion/rods  . VASECTOMY      Social History   Socioeconomic History  . Marital status: Divorced    Spouse name: Not on file  . Number of children: Not on file  . Years of education: Not on file  . Highest education level: Not on file   Occupational History  . Not on file  Tobacco Use  . Smoking status: Current Every Day Smoker    Packs/day: 1.50    Years: 35.00    Pack years: 52.50  . Smokeless tobacco: Never Used  Substance and Sexual Activity  . Alcohol use: Yes  . Drug use: No  . Sexual activity: Not on file  Other Topics Concern  . Not on file  Social History Narrative  . Not on file   Social Determinants of Health   Financial Resource Strain:   . Difficulty of Paying Living Expenses: Not on file  Food Insecurity:   . Worried About Charity fundraiser in the Last Year: Not on file  . Ran Out of Food in the Last Year: Not on file  Transportation Needs:   . Lack of Transportation (Medical): Not on file  . Lack of Transportation (Non-Medical): Not on file  Physical Activity:   . Days of Exercise per Week: Not on file  . Minutes of Exercise per Session: Not on file  Stress:   . Feeling of Stress :  Not on file  Social Connections:   . Frequency of Communication with Friends and Family: Not on file  . Frequency of Social Gatherings with Friends and Family: Not on file  . Attends Religious Services: Not on file  . Active Member of Clubs or Organizations: Not on file  . Attends Archivist Meetings: Not on file  . Marital Status: Not on file    Family History  Problem Relation Age of Onset  . Cancer Mother        colon  . Cancer Father        colon, prostate  . Diabetes Father   . Prostate cancer Father   . Diabetes Maternal Grandmother     Health Maintenance  Topic Date Due  . COLONOSCOPY  06/01/2022  . TETANUS/TDAP  09/08/2027  . INFLUENZA VACCINE  Completed  . Hepatitis C Screening  Completed  . PNA vac Low Risk Adult  Completed    ----------------------------------------------------------------------------------------------------------------------------------------------------------------------------------------------------------------- Physical Exam BP 131/87   Pulse 98    Temp 98.2 F (36.8 C) (Oral)   Ht 5\' 9"  (1.753 m)   Wt 138 lb (62.6 kg)   BMI 20.38 kg/m   Physical Exam Constitutional:      Appearance: He is well-developed.  HENT:     Head: Normocephalic and atraumatic.  Eyes:     General: No scleral icterus. Cardiovascular:     Rate and Rhythm: Normal rate and regular rhythm.  Pulmonary:     Effort: Pulmonary effort is normal.     Breath sounds: Normal breath sounds.  Abdominal:     General: Abdomen is flat. There is no distension.     Palpations: Abdomen is soft.     Tenderness: There is abdominal tenderness (epigastric). There is no right CVA tenderness or left CVA tenderness.  Musculoskeletal:     Cervical back: Neck supple.  Skin:    General: Skin is warm and dry.  Neurological:     General: No focal deficit present.     Mental Status: He is alert and oriented to person, place, and time.  Psychiatric:        Mood and Affect: Mood normal.        Behavior: Behavior normal.     ------------------------------------------------------------------------------------------------------------------------------------------------------------------------------------------------------------------- Assessment and Plan  Abdominal pain, epigastric DDx includes gastritis, renal stone and PUD Check LFT's, UA and CBC today.  Will increase PPI to bid dosing.  Zofran for nausea Will renew tramadol for now.  If not improving will plan to obtain CT abdomen/pelvis.    Chronic pain Pain in back is chronic, possibly having some referred pain from the abdomen.  Will avoid nsaids for now given possibility of gastritis.     This visit occurred during the SARS-CoV-2 public health emergency.  Safety protocols were in place, including screening questions prior to the visit, additional usage of staff PPE, and extensive cleaning of exam room while observing appropriate contact time as indicated for disinfecting solutions.

## 2019-03-01 LAB — COMPLETE METABOLIC PANEL WITH GFR
AG Ratio: 2 (calc) (ref 1.0–2.5)
ALT: 30 U/L (ref 9–46)
AST: 31 U/L (ref 10–35)
Albumin: 4.4 g/dL (ref 3.6–5.1)
Alkaline phosphatase (APISO): 68 U/L (ref 35–144)
BUN/Creatinine Ratio: 17 (calc) (ref 6–22)
BUN: 11 mg/dL (ref 7–25)
CO2: 30 mmol/L (ref 20–32)
Calcium: 10.3 mg/dL (ref 8.6–10.3)
Chloride: 96 mmol/L — ABNORMAL LOW (ref 98–110)
Creat: 0.66 mg/dL — ABNORMAL LOW (ref 0.70–1.25)
GFR, Est African American: 116 mL/min/{1.73_m2} (ref >=60)
GFR, Est Non African American: 100 mL/min/{1.73_m2} (ref >=60)
Globulin: 2.2 g/dL (calc) (ref 1.9–3.7)
Glucose, Bld: 89 mg/dL (ref 65–139)
Potassium: 4.1 mmol/L (ref 3.5–5.3)
Sodium: 137 mmol/L (ref 135–146)
Total Bilirubin: 0.9 mg/dL (ref 0.2–1.2)
Total Protein: 6.6 g/dL (ref 6.1–8.1)

## 2019-03-01 LAB — CBC
HCT: 46.6 % (ref 38.5–50.0)
Hemoglobin: 16.2 g/dL (ref 13.2–17.1)
MCH: 33.2 pg — ABNORMAL HIGH (ref 27.0–33.0)
MCHC: 34.8 g/dL (ref 32.0–36.0)
MCV: 95.5 fL (ref 80.0–100.0)
MPV: 12 fL (ref 7.5–12.5)
Platelets: 249 10*3/uL (ref 140–400)
RBC: 4.88 10*6/uL (ref 4.20–5.80)
RDW: 12.8 % (ref 11.0–15.0)
WBC: 9.1 10*3/uL (ref 3.8–10.8)

## 2019-03-01 LAB — URINALYSIS, ROUTINE W REFLEX MICROSCOPIC
Bacteria, UA: NONE SEEN /HPF
Bilirubin Urine: NEGATIVE
Glucose, UA: NEGATIVE
Hgb urine dipstick: NEGATIVE
Hyaline Cast: NONE SEEN /LPF
Nitrite: NEGATIVE
Protein, ur: NEGATIVE
Specific Gravity, Urine: 1.018 (ref 1.001–1.03)
Squamous Epithelial / HPF: NONE SEEN /HPF (ref <=5)
pH: <=5 (ref 5.0–8.0)

## 2019-03-05 ENCOUNTER — Telehealth: Payer: Self-pay | Admitting: Family Medicine

## 2019-03-05 NOTE — Telephone Encounter (Signed)
Received fax for PA on Tramadol sent through cover my meds waiting on determination. - CF

## 2019-03-14 ENCOUNTER — Other Ambulatory Visit: Payer: Self-pay | Admitting: Family Medicine

## 2019-03-14 MED ORDER — FINASTERIDE 5 MG PO TABS: ORAL_TABLET | ORAL | 1 refills | Status: DC

## 2019-03-29 ENCOUNTER — Ambulatory Visit: Payer: Medicare Other | Admitting: Family Medicine

## 2019-03-29 ENCOUNTER — Other Ambulatory Visit: Payer: Self-pay

## 2019-03-29 NOTE — Patient Outreach (Signed)
[facility]) Care Management  03/29/2019  ELIH BENNETTE 1950-02-11 [id number]   Medication Adherence call to Mr. Armel Sentman Telephone call to Patient regarding Medication Adherence unable to reach patient. Mr. Riddick is showing past due on Valsartan/Hctz 160/12.5 mg under [place].   [facility] Management Direct Dial [phone]  Fax [phone] [email or web address]

## 2019-03-30 ENCOUNTER — Other Ambulatory Visit: Payer: Self-pay | Admitting: Family Medicine

## 2019-03-30 MED ORDER — VALSARTAN-HYDROCHLOROTHIAZIDE 160-12.5 MG PO TABS: 1.0000 | ORAL_TABLET | Freq: Every day | ORAL | 1 refills | Status: DC

## 2019-04-01 MED ORDER — TRAMADOL HCL 50 MG PO TABS: 50.0000 mg | ORAL_TABLET | Freq: Three times a day (TID) | ORAL | 0 refills | Status: DC | PRN

## 2019-04-04 ENCOUNTER — Encounter: Payer: Self-pay | Admitting: Family Medicine

## 2019-04-04 ENCOUNTER — Other Ambulatory Visit: Payer: Self-pay

## 2019-04-04 ENCOUNTER — Ambulatory Visit (INDEPENDENT_AMBULATORY_CARE_PROVIDER_SITE_OTHER): Payer: Medicare Other | Admitting: Family Medicine

## 2019-04-04 VITALS — BP 122/75 | HR 77 | Ht 69.0 in | Wt 130.0 lb

## 2019-04-04 DIAGNOSIS — R1013 Epigastric pain: Secondary | ICD-10-CM

## 2019-04-04 NOTE — Patient Instructions (Signed)
Have labs completed today.  You will be contacted to arrange for CT scan.  Please call or seek emergency care if you have severe pain, jaundice or yellowing of eyes.

## 2019-04-04 NOTE — Assessment & Plan Note (Addendum)
Continued abdominal pain with nausea, now with some increased back pain as well.  History of EtOH abuse and nicotine use.  Check Lipase and CT abdomen/pelvis ordered If these are normal we'll proceed with referral to GI for consideration of endoscopy.

## 2019-04-04 NOTE — Progress Notes (Signed)
Jimmy Mayo - 69 y.o. male MRN [id number]  Date of birth: 1950-01-30  Subjective Chief Complaint  Patient presents with  . Abdominal Pain    HPI TYLE DIAB is a 69 y.o. male here today for follow up of abdominal pain.  I first saw him for this about 1 month ago.  Pain continues in epigastric area.  Unfortunately he no showed for last scheduled follow up with me a few weeks ago.  He has intermittent nausea.  He also has pain that radiates into back if laying down.  He denies urinary symptoms.  Prior labs including CMP, CBC and UA were WNL.  He denies changes to stools, fever, chills, shortness of breath, dizziness or chest pain.  He has had improvement with tramadol for pain and zofran for nausea.  He is not sure if protonix has been helpful  ROS:  A comprehensive ROS was completed and negative except as noted per HPI  Allergies  Allergen Reactions  . Amoxicillin   . Niacin And Related Other (See Comments)    Flushing   . Penicillins   . Pravastatin Sodium Other (See Comments)    REACTION: diarrhea  . Zetia [Ezetimibe]     constipation    Past Medical History:  Diagnosis Date  . Anemia   . Anxiety   . Asthma   . Back pain   . Chronic kidney disease   . GERD (gastroesophageal reflux disease)   . Kidney stone   . Retinal detachment 03/31/2018   Rt eye 03/29/18  . Rupture long head biceps tendon 03/22/2017  . Vitreous detachment of both eyes 03/31/2018   03/29/18    Past Surgical History:  Procedure Laterality Date  . COLONOSCOPY W/ BIOPSIES AND POLYPECTOMY     was to have 1 this past year but planing to see new GI in [place] realize he may need onne every 5 years  . HYDROCELE EXCISION / REPAIR     x3 or 2 on 1 and 1 on the other  . KNEE ARTHROSCOPY     1986  . LITHOTRIPSY    . NASAL SINUS SURGERY    . SPINAL FUSION    . SPINE SURGERY  2006   l spine fusion/rods  . VASECTOMY      Social History   Socioeconomic History  . Marital status: Divorced    Spouse  name: Not on file  . Number of children: Not on file  . Years of education: Not on file  . Highest education level: Not on file  Occupational History  . Not on file  Tobacco Use  . Smoking status: Current Every Day Smoker    Packs/day: 1.50    Years: 35.00    Pack years: 52.50  . Smokeless tobacco: Never Used  Substance and Sexual Activity  . Alcohol use: Yes  . Drug use: No  . Sexual activity: Not on file  Other Topics Concern  . Not on file  Social History Narrative  . Not on file   Social Determinants of Health   Financial Resource Strain:   . Difficulty of Paying Living Expenses:   Food Insecurity:   . Worried About Charity fundraiser in the Last Year:   . Arboriculturist in the Last Year:   Transportation Needs:   . Film/video editor (Medical):   [name] Lack of Transportation (Non-Medical):   Physical Activity:   . Days of Exercise per Week:   . Minutes  of Exercise per Session:   Stress:   . Feeling of Stress :   Social Connections:   . Frequency of Communication with Friends and Family:   . Frequency of Social Gatherings with Friends and Family:   . Attends Religious Services:   . Active Member of Clubs or Organizations:   . Attends Archivist Meetings:   [name] Marital Status:     Family History  Problem Relation Age of Onset  . Cancer Mother        colon  . Cancer Father        colon, prostate  . Diabetes Father   . Prostate cancer Father   . Diabetes Maternal Grandmother     Health Maintenance  Topic Date Due  . COLONOSCOPY  06/01/2022  . TETANUS/TDAP  09/08/2027  . INFLUENZA VACCINE  Completed  . Hepatitis C Screening  Completed  . PNA vac Low Risk Adult  Completed     ----------------------------------------------------------------------------------------------------------------------------------------------------------------------------------------------------------------- Physical Exam BP 122/75   Pulse 77   Ht 5\' 9"  (1.753 m)    Wt 130 lb (59 kg)   BMI 19.20 kg/m   Physical Exam Constitutional:      Appearance: Normal appearance.  HENT:     Head: Normocephalic and atraumatic.  Eyes:     General: No scleral icterus. Cardiovascular:     Rate and Rhythm: Normal rate and regular rhythm.  Pulmonary:     Effort: Pulmonary effort is normal.     Breath sounds: Normal breath sounds.  Abdominal:     General: Abdomen is flat. There is no distension.     Tenderness: There is abdominal tenderness (RUQ, LUQ and episgastric tenderness.  Some mild ttp along lower quadrants as well. ).  Musculoskeletal:     Cervical back: Neck supple.  Neurological:     General: No focal deficit present.     Mental Status: He is alert.  Psychiatric:        Mood and Affect: Mood normal.        Behavior: Behavior normal.     ------------------------------------------------------------------------------------------------------------------------------------------------------------------------------------------------------------------- Assessment and Plan  Abdominal pain, epigastric Continued abdominal pain with nausea, now with some increased back pain as well.  History of EtOH abuse and nicotine use.  Check Lipase and CT abdomen/pelvis ordered If these are normal we'll proceed with referral to GI for consideration of endoscopy.    No orders of the defined types were placed in this encounter.   No follow-ups on file.    This visit occurred during the SARS-CoV-2 public health emergency.  Safety protocols were in place, including screening questions prior to the visit, additional usage of staff PPE, and extensive cleaning of exam room while observing appropriate contact time as indicated for disinfecting solutions.

## 2019-04-05 LAB — LIPASE: Lipase: 106 U/L — ABNORMAL HIGH (ref 7–60)

## 2019-04-06 DIAGNOSIS — H338 Other retinal detachments: Secondary | ICD-10-CM | POA: Diagnosis not present

## 2019-04-06 DIAGNOSIS — H40003 Preglaucoma, unspecified, bilateral: Secondary | ICD-10-CM | POA: Diagnosis not present

## 2019-04-09 ENCOUNTER — Ambulatory Visit (INDEPENDENT_AMBULATORY_CARE_PROVIDER_SITE_OTHER): Payer: Medicare Other

## 2019-04-09 ENCOUNTER — Other Ambulatory Visit: Payer: Self-pay

## 2019-04-09 DIAGNOSIS — H43812 Vitreous degeneration, left eye: Secondary | ICD-10-CM | POA: Diagnosis not present

## 2019-04-09 DIAGNOSIS — H33012 Retinal detachment with single break, left eye: Secondary | ICD-10-CM | POA: Diagnosis not present

## 2019-04-09 DIAGNOSIS — R1110 Vomiting, unspecified: Secondary | ICD-10-CM | POA: Diagnosis not present

## 2019-04-09 DIAGNOSIS — H31091 Other chorioretinal scars, right eye: Secondary | ICD-10-CM | POA: Diagnosis not present

## 2019-04-09 DIAGNOSIS — H35372 Puckering of macula, left eye: Secondary | ICD-10-CM | POA: Diagnosis not present

## 2019-04-09 DIAGNOSIS — R1013 Epigastric pain: Secondary | ICD-10-CM

## 2019-04-09 MED ORDER — IOHEXOL 300 MG/ML  SOLN
100.0000 mL | Freq: Once | INTRAMUSCULAR | Status: AC | PRN
  Administered 2019-04-09: 100 mL via INTRAVENOUS

## 2019-04-10 ENCOUNTER — Other Ambulatory Visit: Payer: Self-pay | Admitting: Family Medicine

## 2019-04-10 DIAGNOSIS — K269 Duodenal ulcer, unspecified as acute or chronic, without hemorrhage or perforation: Secondary | ICD-10-CM

## 2019-04-10 NOTE — Telephone Encounter (Signed)
Last filled 09/20/2018 #90 with 5 refills

## 2019-04-14 DIAGNOSIS — Z01812 Encounter for preprocedural laboratory examination: Secondary | ICD-10-CM | POA: Diagnosis not present

## 2019-04-18 DIAGNOSIS — H33022 Retinal detachment with multiple breaks, left eye: Secondary | ICD-10-CM | POA: Diagnosis not present

## 2019-04-18 DIAGNOSIS — J449 Chronic obstructive pulmonary disease, unspecified: Secondary | ICD-10-CM | POA: Diagnosis not present

## 2019-04-18 DIAGNOSIS — H35372 Puckering of macula, left eye: Secondary | ICD-10-CM | POA: Diagnosis not present

## 2019-04-18 DIAGNOSIS — H33052 Total retinal detachment, left eye: Secondary | ICD-10-CM | POA: Diagnosis not present

## 2019-04-18 DIAGNOSIS — H3322 Serous retinal detachment, left eye: Secondary | ICD-10-CM | POA: Diagnosis not present

## 2019-04-18 DIAGNOSIS — I10 Essential (primary) hypertension: Secondary | ICD-10-CM | POA: Diagnosis not present

## 2019-04-24 ENCOUNTER — Other Ambulatory Visit: Payer: Self-pay | Admitting: Family Medicine

## 2019-04-24 NOTE — Telephone Encounter (Signed)
Patient called and wants to know if he can get a refill of his Tramadol sent to [facility]. Please advise.

## 2019-04-30 MED ORDER — TRAMADOL HCL 50 MG PO TABS: 50.0000 mg | ORAL_TABLET | Freq: Three times a day (TID) | ORAL | 0 refills | Status: DC | PRN

## 2019-05-14 DIAGNOSIS — H35372 Puckering of macula, left eye: Secondary | ICD-10-CM | POA: Diagnosis not present

## 2019-06-01 ENCOUNTER — Other Ambulatory Visit: Payer: Self-pay

## 2019-06-01 MED ORDER — TRAMADOL HCL 50 MG PO TABS: 50.0000 mg | ORAL_TABLET | Freq: Three times a day (TID) | ORAL | 0 refills | Status: DC | PRN

## 2019-06-07 NOTE — Telephone Encounter (Signed)
Checked on PA through cover my meds and tramadol was approved .   Reference# [id number] Valid through 01/11/2020 - CF

## 2019-06-14 DIAGNOSIS — H35372 Puckering of macula, left eye: Secondary | ICD-10-CM | POA: Diagnosis not present

## 2019-06-29 ENCOUNTER — Other Ambulatory Visit: Payer: Self-pay

## 2019-07-08 ENCOUNTER — Other Ambulatory Visit: Payer: Self-pay | Admitting: Family Medicine

## 2019-07-10 ENCOUNTER — Other Ambulatory Visit: Payer: Self-pay

## 2019-07-10 MED ORDER — TRAMADOL HCL 50 MG PO TABS: 50.0000 mg | ORAL_TABLET | Freq: Three times a day (TID) | ORAL | 0 refills | Status: DC | PRN

## 2019-07-11 ENCOUNTER — Other Ambulatory Visit: Payer: Self-pay | Admitting: Family Medicine

## 2019-07-11 MED ORDER — TRAMADOL HCL 50 MG PO TABS: 50.0000 mg | ORAL_TABLET | Freq: Three times a day (TID) | ORAL | 1 refills | Status: DC | PRN

## 2019-07-23 ENCOUNTER — Other Ambulatory Visit: Payer: Self-pay

## 2019-07-26 MED ORDER — ZOLPIDEM TARTRATE 10 MG PO TABS: ORAL_TABLET | ORAL | 1 refills | Status: DC

## 2019-08-13 ENCOUNTER — Encounter: Payer: Self-pay | Admitting: Family Medicine

## 2019-08-13 DIAGNOSIS — H31093 Other chorioretinal scars, bilateral: Secondary | ICD-10-CM | POA: Diagnosis not present

## 2019-08-13 DIAGNOSIS — H35372 Puckering of macula, left eye: Secondary | ICD-10-CM | POA: Diagnosis not present

## 2019-09-04 ENCOUNTER — Other Ambulatory Visit: Payer: Self-pay | Admitting: Family Medicine

## 2019-09-07 ENCOUNTER — Other Ambulatory Visit: Payer: Self-pay | Admitting: Family Medicine

## 2019-09-11 ENCOUNTER — Other Ambulatory Visit: Payer: Self-pay | Admitting: Family Medicine

## 2019-09-25 ENCOUNTER — Other Ambulatory Visit: Payer: Self-pay | Admitting: Family Medicine

## 2019-12-10 ENCOUNTER — Other Ambulatory Visit: Payer: Self-pay | Admitting: Family Medicine

## 2019-12-12 ENCOUNTER — Other Ambulatory Visit: Payer: Self-pay | Admitting: Family Medicine

## 2019-12-18 ENCOUNTER — Encounter: Payer: Self-pay | Admitting: Family Medicine

## 2019-12-18 ENCOUNTER — Ambulatory Visit (INDEPENDENT_AMBULATORY_CARE_PROVIDER_SITE_OTHER): Payer: Medicare Other | Admitting: Family Medicine

## 2019-12-18 ENCOUNTER — Other Ambulatory Visit: Payer: Self-pay

## 2019-12-18 VITALS — BP 124/76 | HR 90 | Temp 98.1°F | Wt 137.5 lb

## 2019-12-18 DIAGNOSIS — F17200 Nicotine dependence, unspecified, uncomplicated: Secondary | ICD-10-CM

## 2019-12-18 DIAGNOSIS — E782 Mixed hyperlipidemia: Secondary | ICD-10-CM | POA: Diagnosis not present

## 2019-12-18 DIAGNOSIS — Z122 Encounter for screening for malignant neoplasm of respiratory organs: Secondary | ICD-10-CM

## 2019-12-18 DIAGNOSIS — I700 Atherosclerosis of aorta: Secondary | ICD-10-CM

## 2019-12-18 DIAGNOSIS — Z23 Encounter for immunization: Secondary | ICD-10-CM | POA: Diagnosis not present

## 2019-12-18 DIAGNOSIS — N401 Enlarged prostate with lower urinary tract symptoms: Secondary | ICD-10-CM

## 2019-12-18 DIAGNOSIS — I10 Essential (primary) hypertension: Secondary | ICD-10-CM

## 2019-12-18 DIAGNOSIS — R7303 Prediabetes: Secondary | ICD-10-CM

## 2019-12-18 DIAGNOSIS — Z0000 Encounter for general adult medical examination without abnormal findings: Secondary | ICD-10-CM | POA: Diagnosis not present

## 2019-12-18 DIAGNOSIS — D485 Neoplasm of uncertain behavior of skin: Secondary | ICD-10-CM

## 2019-12-18 DIAGNOSIS — R350 Frequency of micturition: Secondary | ICD-10-CM | POA: Diagnosis not present

## 2019-12-18 MED ORDER — ALLOPURINOL 300 MG PO TABS
ORAL_TABLET | ORAL | 3 refills | Status: DC
Start: 2019-12-18 — End: 2020-03-13

## 2019-12-18 NOTE — Assessment & Plan Note (Signed)
Blood pressure is at goal at for age and co-morbidities.  I recommend continuation of current medication.  In addition they were instructed to follow a low sodium diet with regular exercise to help to maintain adequate control of blood pressure.

## 2019-12-18 NOTE — Progress Notes (Signed)
Ondre Salvetti Pickard - 69 y.o. male MRN [id number]  Date of birth: 09/23/1950  Subjective Chief Complaint  Patient presents with  . Annual Exam    HPI FARRELL PANTALEO is a 69 y.o. male here today for annual exam.  He has a history of HTN, COPD with continued nicotine use, BPH, HLD, gout, chronic low back pain.  His chronic medical problems are pretty well controlled at this time.  He continues to have issues with retinal detachment and diplopia.  He is seeing a retinal specialist and general ophthalmologist for this.    He does continue to smoke daily.  He has smoked about 1-1.5ppd since he was around 65.  He has never had lung cancer screening and would be interested in having this.  He has tried several interventions to help with quitting but hasn't had lasting success.   He has cut back some on his EtOH intake.     He does not stay very active.  He feels like his diet is pretty good.    He has a skin lesion on neck that bleeds frequently.  He would like referral to dermatology.   Review of Systems  Constitutional: Negative for chills, fever, malaise/fatigue and weight loss.  HENT: Negative for congestion, ear pain and sore throat.   Eyes: Negative for blurred vision, double vision and pain.  Respiratory: Negative for cough and shortness of breath.   Cardiovascular: Negative for chest pain and palpitations.  Gastrointestinal: Negative for abdominal pain, blood in stool, constipation, heartburn and nausea.  Genitourinary: Negative for dysuria and urgency.  Musculoskeletal: Negative for joint pain and myalgias.  Neurological: Negative for dizziness and headaches.  Endo/Heme/Allergies: Does not bruise/bleed easily.  Psychiatric/Behavioral: Negative for depression. The patient is not nervous/anxious and does not have insomnia.        Allergies  Allergen Reactions  . Amoxicillin   . Niacin And Related Other (See Comments)    Flushing   . Penicillins   . Pravastatin Sodium Other (See  Comments)    REACTION: diarrhea  . Zetia [Ezetimibe]     constipation    Past Medical History:  Diagnosis Date  . Anemia   . Anxiety   . Asthma   . Back pain   . Chronic kidney disease   . GERD (gastroesophageal reflux disease)   . Hypertension   . Kidney stone   . Retinal detachment 03/31/2018   Rt eye 03/29/18  . Rupture long head biceps tendon 03/22/2017  . Vitreous detachment of both eyes 03/31/2018   03/29/18    Past Surgical History:  Procedure Laterality Date  . COLONOSCOPY W/ BIOPSIES AND POLYPECTOMY     was to have 1 this past year but planing to see new GI in [place] realize he may need onne every 5 years  . HYDROCELE EXCISION / REPAIR     x3 or 2 on 1 and 1 on the other  . KNEE ARTHROSCOPY     1986  . LITHOTRIPSY    . NASAL SINUS SURGERY    . SPINAL FUSION    . SPINE SURGERY  2006   l spine fusion/rods  . VASECTOMY      Social History   Socioeconomic History  . Marital status: Divorced    Spouse name: Not on file  . Number of children: Not on file  . Years of education: Not on file  . Highest education level: Not on file  Occupational History  . Not on file  Tobacco Use  . Smoking status: Current Every Day Smoker    Packs/day: 1.50    Years: 35.00    Pack years: 52.50  . Smokeless tobacco: Never Used  Substance and Sexual Activity  . Alcohol use: Yes  . Drug use: No  . Sexual activity: Not on file  Other Topics Concern  . Not on file  Social History Narrative  . Not on file   Social Determinants of Health   Financial Resource Strain:   . Difficulty of Paying Living Expenses: Not on file  Food Insecurity:   . Worried About Charity fundraiser in the Last Year: Not on file  . Ran Out of Food in the Last Year: Not on file  Transportation Needs:   . Lack of Transportation (Medical): Not on file  . Lack of Transportation (Non-Medical): Not on file  Physical Activity:   . Days of Exercise per Week: Not on file  . Minutes of Exercise per  Session: Not on file  Stress:   . Feeling of Stress : Not on file  Social Connections:   . Frequency of Communication with Friends and Family: Not on file  . Frequency of Social Gatherings with Friends and Family: Not on file  . Attends Religious Services: Not on file  . Active Member of Clubs or Organizations: Not on file  . Attends Archivist Meetings: Not on file  . Marital Status: Not on file    Family History  Problem Relation Age of Onset  . Cancer Mother        colon  . Cancer Father        colon, prostate  . Diabetes Father   . Prostate cancer Father   . Diabetes Maternal Grandmother     Health Maintenance  Topic Date Due  . COLONOSCOPY  06/01/2022  . TETANUS/TDAP  09/08/2027  . INFLUENZA VACCINE  Completed  . COVID-19 Vaccine  Completed  . Hepatitis C Screening  Completed  . PNA vac Low Risk Adult  Completed     ----------------------------------------------------------------------------------------------------------------------------------------------------------------------------------------------------------------- Physical Exam BP 124/76 (BP Location: Left Arm, Patient Position: Sitting, Cuff Size: Normal)   Pulse 90   Temp 98.1 F (36.7 C)   Wt 137 lb 8 oz (62.4 kg)   SpO2 99%   BMI 20.31 kg/m   Physical Exam Constitutional:      General: He is not in acute distress. HENT:     Head: Normocephalic and atraumatic.     Right Ear: External ear normal.     Left Ear: External ear normal.  Eyes:     General: No scleral icterus. Neck:     Thyroid: No thyromegaly.  Cardiovascular:     Rate and Rhythm: Normal rate and regular rhythm.     Heart sounds: Normal heart sounds.  Pulmonary:     Effort: Pulmonary effort is normal.     Breath sounds: Normal breath sounds.  Abdominal:     General: Bowel sounds are normal. There is no distension.     Palpations: Abdomen is soft.     Tenderness: There is no abdominal tenderness. There is no guarding.   Musculoskeletal:     Cervical back: Normal range of motion.  Lymphadenopathy:     Cervical: No cervical adenopathy.  Skin:    General: Skin is warm and dry.     Findings: No rash.     Comments: Raised, ulcerated lesion to R neck  Neurological:     Mental Status:  He is alert and oriented to person, place, and time.     Cranial Nerves: No cranial nerve deficit.     Motor: No abnormal muscle tone.  Psychiatric:        Behavior: Behavior normal.     ------------------------------------------------------------------------------------------------------------------------------------------------------------------------------------------------------------------- Assessment and Plan  No problem-specific Assessment & Plan notes found for this encounter.   Meds ordered this encounter  Medications  . allopurinol (ZYLOPRIM) 300 MG tablet    Sig: TAKE 1 TABLET(300 MG) BY MOUTH TWICE DAILY    Dispense:  180 tablet    Refill:  3    No follow-ups on file.    This visit occurred during the SARS-CoV-2 public health emergency.  Safety protocols were in place, including screening questions prior to the visit, additional usage of staff PPE, and extensive cleaning of exam room while observing appropriate contact time as indicated for disinfecting solutions.

## 2019-12-18 NOTE — Assessment & Plan Note (Signed)
Recommended to quit smoking.  Update lipid panel, history of intolerance to statins.

## 2019-12-18 NOTE — Assessment & Plan Note (Signed)
Well adult Orders Placed This Encounter  Procedures  . Flu Vaccine QUAD High Dose(Fluad)  . COMPLETE METABOLIC PANEL WITH GFR  . CBC  . Lipid Profile  . PSA  . HgB A1c  . Ambulatory Referral for Lung Cancer Scre    Referral Priority:   Routine    Referral Type:   Consultation    Referral Reason:   Specialty Services Required    Number of Visits Requested:   1  . Ambulatory referral to Dermatology    Referral Priority:   Routine    Referral Type:   Consultation    Referral Reason:   Specialty Services Required    Requested Specialty:   Dermatology    Number of Visits Requested:   1  Immunizations: Flu vaccine Screenings;  Referral for lung cancer screening Referral placed dermatology for skin lesion.  Anticipatory guidance/Risk factor reduction:  Recommendation to quit smoking and counseling provided.  Additional recommendations per AVS.

## 2019-12-18 NOTE — Patient Instructions (Signed)

## 2019-12-19 LAB — CBC
HCT: 43.4 % (ref 38.5–50.0)
Hemoglobin: 14.9 g/dL (ref 13.2–17.1)
MCH: 31 pg (ref 27.0–33.0)
MCHC: 34.3 g/dL (ref 32.0–36.0)
MCV: 90.4 fL (ref 80.0–100.0)
MPV: 12.4 fL (ref 7.5–12.5)
Platelets: 282 10*3/uL (ref 140–400)
RBC: 4.8 10*6/uL (ref 4.20–5.80)
RDW: 13 % (ref 11.0–15.0)
WBC: 11.4 10*3/uL — ABNORMAL HIGH (ref 3.8–10.8)

## 2019-12-19 LAB — COMPLETE METABOLIC PANEL WITH GFR
AG Ratio: 2.1 (calc) (ref 1.0–2.5)
ALT: 12 U/L (ref 9–46)
AST: 15 U/L (ref 10–35)
Albumin: 4.6 g/dL (ref 3.6–5.1)
Alkaline phosphatase (APISO): 76 U/L (ref 35–144)
BUN: 11 mg/dL (ref 7–25)
CO2: 24 mmol/L (ref 20–32)
Calcium: 10.3 mg/dL (ref 8.6–10.3)
Chloride: 101 mmol/L (ref 98–110)
Creat: 0.72 mg/dL (ref 0.70–1.25)
GFR, Est African American: 111 mL/min/{1.73_m2} (ref >=60)
GFR, Est Non African American: 96 mL/min/{1.73_m2} (ref >=60)
Globulin: 2.2 g/dL (calc) (ref 1.9–3.7)
Glucose, Bld: 89 mg/dL (ref 65–99)
Potassium: 4.6 mmol/L (ref 3.5–5.3)
Sodium: 141 mmol/L (ref 135–146)
Total Bilirubin: 0.9 mg/dL (ref 0.2–1.2)
Total Protein: 6.8 g/dL (ref 6.1–8.1)

## 2019-12-19 LAB — LIPID PANEL
Cholesterol: 170 mg/dL (ref <200)
HDL: 50 mg/dL (ref >=40)
LDL Cholesterol (Calc): 103 mg/dL (calc) — ABNORMAL HIGH
Non-HDL Cholesterol (Calc): 120 mg/dL (calc) (ref <130)
Total CHOL/HDL Ratio: 3.4 (calc) (ref <5.0)
Triglycerides: 81 mg/dL (ref <150)

## 2019-12-19 LAB — HEMOGLOBIN A1C
Hgb A1c MFr Bld: 5.5 % of total Hgb (ref <5.7)
Mean Plasma Glucose: 111 mg/dL
eAG (mmol/L): 6.2 mmol/L

## 2019-12-19 LAB — PSA: PSA: 3.43 ng/mL (ref <=4.0)

## 2019-12-24 ENCOUNTER — Other Ambulatory Visit: Payer: Self-pay

## 2019-12-24 DIAGNOSIS — D72829 Elevated white blood cell count, unspecified: Secondary | ICD-10-CM

## 2020-01-17 DIAGNOSIS — D3132 Benign neoplasm of left choroid: Secondary | ICD-10-CM | POA: Diagnosis not present

## 2020-01-17 DIAGNOSIS — H35372 Puckering of macula, left eye: Secondary | ICD-10-CM | POA: Diagnosis not present

## 2020-01-17 DIAGNOSIS — H35362 Drusen (degenerative) of macula, left eye: Secondary | ICD-10-CM | POA: Diagnosis not present

## 2020-01-17 DIAGNOSIS — H31093 Other chorioretinal scars, bilateral: Secondary | ICD-10-CM | POA: Diagnosis not present

## 2020-03-01 ENCOUNTER — Other Ambulatory Visit: Payer: Self-pay | Admitting: Family Medicine

## 2020-03-13 ENCOUNTER — Other Ambulatory Visit: Payer: Self-pay | Admitting: Sports Medicine

## 2020-03-13 ENCOUNTER — Other Ambulatory Visit: Payer: Self-pay | Admitting: Family Medicine

## 2020-03-13 MED ORDER — ALLOPURINOL 300 MG PO TABS
ORAL_TABLET | ORAL | 3 refills | Status: DC
Start: 2020-03-13 — End: 2021-11-18

## 2020-03-23 ENCOUNTER — Other Ambulatory Visit: Payer: Self-pay | Admitting: Family Medicine

## 2020-04-03 ENCOUNTER — Other Ambulatory Visit: Payer: Self-pay | Admitting: Family Medicine

## 2020-04-03 DIAGNOSIS — D72829 Elevated white blood cell count, unspecified: Secondary | ICD-10-CM | POA: Diagnosis not present

## 2020-04-04 LAB — CBC WITH DIFFERENTIAL/PLATELET
Absolute Monocytes: 784 cells/uL (ref 200–950)
Basophils Absolute: 56 cells/uL (ref 0–200)
Basophils Relative: 0.7 %
Eosinophils Absolute: 288 cells/uL (ref 15–500)
Eosinophils Relative: 3.6 %
HCT: 44.8 % (ref 38.5–50.0)
Hemoglobin: 15.1 g/dL (ref 13.2–17.1)
Lymphs Abs: 2360 cells/uL (ref 850–3900)
MCH: 31.1 pg (ref 27.0–33.0)
MCHC: 33.7 g/dL (ref 32.0–36.0)
MCV: 92.2 fL (ref 80.0–100.0)
MPV: 12.2 fL (ref 7.5–12.5)
Monocytes Relative: 9.8 %
Neutro Abs: 4512 cells/uL (ref 1500–7800)
Neutrophils Relative %: 56.4 %
Platelets: 340 10*3/uL (ref 140–400)
RBC: 4.86 10*6/uL (ref 4.20–5.80)
RDW: 13.3 % (ref 11.0–15.0)
Total Lymphocyte: 29.5 %
WBC: 8 10*3/uL (ref 3.8–10.8)

## 2020-04-09 ENCOUNTER — Other Ambulatory Visit: Payer: Self-pay | Admitting: Family Medicine

## 2020-05-14 ENCOUNTER — Other Ambulatory Visit: Payer: Self-pay | Admitting: Family Medicine

## 2020-05-20 DIAGNOSIS — H532 Diplopia: Secondary | ICD-10-CM | POA: Diagnosis not present

## 2020-05-20 DIAGNOSIS — H0100B Unspecified blepharitis left eye, upper and lower eyelids: Secondary | ICD-10-CM | POA: Diagnosis not present

## 2020-05-20 DIAGNOSIS — H35371 Puckering of macula, right eye: Secondary | ICD-10-CM | POA: Diagnosis not present

## 2020-05-20 DIAGNOSIS — H33012 Retinal detachment with single break, left eye: Secondary | ICD-10-CM | POA: Diagnosis not present

## 2020-05-20 DIAGNOSIS — H40003 Preglaucoma, unspecified, bilateral: Secondary | ICD-10-CM | POA: Diagnosis not present

## 2020-05-20 DIAGNOSIS — H33013 Retinal detachment with single break, bilateral: Secondary | ICD-10-CM | POA: Diagnosis not present

## 2020-05-20 DIAGNOSIS — H527 Unspecified disorder of refraction: Secondary | ICD-10-CM | POA: Diagnosis not present

## 2020-05-20 DIAGNOSIS — H02834 Dermatochalasis of left upper eyelid: Secondary | ICD-10-CM | POA: Diagnosis not present

## 2020-05-20 DIAGNOSIS — H02831 Dermatochalasis of right upper eyelid: Secondary | ICD-10-CM | POA: Diagnosis not present

## 2020-05-20 DIAGNOSIS — H0100A Unspecified blepharitis right eye, upper and lower eyelids: Secondary | ICD-10-CM | POA: Diagnosis not present

## 2020-05-20 DIAGNOSIS — H43812 Vitreous degeneration, left eye: Secondary | ICD-10-CM | POA: Diagnosis not present

## 2020-05-20 DIAGNOSIS — Z961 Presence of intraocular lens: Secondary | ICD-10-CM | POA: Diagnosis not present

## 2020-06-11 ENCOUNTER — Telehealth: Payer: Self-pay | Admitting: Family Medicine

## 2020-06-11 NOTE — Progress Notes (Signed)
  Chronic Care Management   Outreach Note  06/11/2020 Name: GLENDALE YOUNGBLOOD MRN: [id number] DOB: 20-May-1950  Referred by: Luetta Nutting, DO Reason for referral : No chief complaint on file.   A second unsuccessful telephone outreach was attempted today. The patient was referred to pharmacist for assistance with care management and care coordination.  Follow Up Plan:   Lauretta Grill Upstream Scheduler

## 2020-07-07 ENCOUNTER — Telehealth: Payer: Self-pay | Admitting: Family Medicine

## 2020-07-07 NOTE — Progress Notes (Signed)
  Chronic Care Management   Outreach Note  07/07/2020 Name: Jimmy Mayo MRN: [id number] DOB: 03-09-1950  Referred by: Luetta Nutting, DO Reason for referral : No chief complaint on file.   Third unsuccessful telephone outreach was attempted today. The patient was referred to the pharmacist for assistance with care management and care coordination.   Follow Up Plan:   Lauretta Grill Upstream Scheduler

## 2020-07-16 ENCOUNTER — Other Ambulatory Visit: Payer: Self-pay | Admitting: Family Medicine

## 2020-09-11 ENCOUNTER — Other Ambulatory Visit: Payer: Self-pay

## 2020-09-11 MED ORDER — PANTOPRAZOLE SODIUM 40 MG PO TBEC: DELAYED_RELEASE_TABLET | ORAL | 0 refills | Status: DC

## 2020-09-11 MED ORDER — FINASTERIDE 5 MG PO TABS: ORAL_TABLET | ORAL | 0 refills | Status: DC

## 2020-09-12 NOTE — Progress Notes (Signed)
Left voicemail message for patient to call back to get this appointment scheduled. AM

## 2020-09-18 ENCOUNTER — Telehealth: Payer: Self-pay | Admitting: Lab

## 2020-09-18 NOTE — Progress Notes (Signed)
  Chronic Care Management   Outreach Note  09/18/2020 Name: JADEON KEYTON MRN: [id number] DOB: [birth year]  Referred by: Luetta Nutting, DO Reason for referral : Medication Management   An unsuccessful telephone outreach was attempted today. The patient was referred to the pharmacist for assistance with care management and care coordination.   Follow Up Plan:   [place]

## 2020-09-20 ENCOUNTER — Ambulatory Visit (INDEPENDENT_AMBULATORY_CARE_PROVIDER_SITE_OTHER): Payer: Medicare Other | Admitting: *Deleted

## 2020-09-20 DIAGNOSIS — Z0000 Encounter for general adult medical examination without abnormal findings: Secondary | ICD-10-CM | POA: Diagnosis not present

## 2020-09-20 NOTE — Progress Notes (Signed)
Subjective:   Jimmy Mayo is a 70 y.o. male who presents for Medicare Annual/Subsequent preventive examination.  Virtual Visit via Telephone Note  I connected with  Jimmy Mayo on 09/20/20 at 11:00 AM EDT by telephone and verified that I am speaking with the correct person using two identifiers.  Location: Patient: Home Provider: office  Persons participating in the virtual visit: patient/Nurse Health Advisor   I discussed the limitations, risks, security and privacy concerns of performing an evaluation and management service by telephone and the availability of in person appointments. The patient expressed understanding and agreed to proceed.  We continued and completed visit with audio only.  Some vital signs may be absent or patient reported.   Massie Maroon, CMA  Cardiac Risk Factors include: advanced age (>87mn, >>66women);male gender     Objective:    There were no vitals filed for this visit. There is no height or weight on file to calculate BMI.  Advanced Directives 09/20/2020  Does Patient Have a Medical Advance Directive? Yes  Type of Advance Directive [place] Does patient want to make changes to medical advance directive? No - Patient declined  Copy of [place] Chart? No - copy requested    Current Medications (verified) Outpatient Encounter Medications as of 09/20/2020  Medication Sig   albuterol (VENTOLIN HFA) 108 (90 Base) MCG/ACT inhaler Inhale 2 puffs into the lungs every 6 (six) hours as needed for wheezing or shortness of breath.   allopurinol (ZYLOPRIM) 300 MG tablet TAKE 1 TABLET(300 MG) BY MOUTH TWICE DAILY (Patient taking differently: 300 mg daily. TAKE 1 TABLET(300 MG) BY MOUTH DAILY)   ALPRAZolam (XANAX) 0.5 MG tablet TAKE 1 TABLET BY MOUTH THREE TIMES DAILY AS NEEDED   Ascorbic Acid (VITAMIN C PO) Take by mouth.   diclofenac sodium (VOLTAREN) 1 % GEL Apply 4 g topically 4 (four) times daily. To  affected joint.   ECHINACEA PO Take by mouth.   finasteride (PROSCAR) 5 MG tablet Take 1 tablet per day   Multiple Vitamin (MULTIVITAMIN) capsule Take 1 capsule by mouth daily.     pantoprazole (PROTONIX) 40 MG tablet TAKE 1 TABLET(40 MG) BY MOUTH TWICE DAILY   traMADol (ULTRAM) 50 MG tablet TAKE 1 TABLET(50 MG) BY MOUTH EVERY 8 HOURS AS NEEDED   valsartan-hydrochlorothiazide (DIOVAN-HCT) 160-12.5 MG tablet TAKE 1 TABLET BY MOUTH DAILY   zolpidem (AMBIEN) 10 MG tablet TAKE 1 TABLET BY MOUTH EVERY NIGHT AT BEDTIME AS NEEDED FOR SLEEP FOR 90 DAYS (Patient not taking: Reported on 09/20/2020)   [DISCONTINUED] albuterol (VENTOLIN HFA) 108 (90 Base) MCG/ACT inhaler INHALE 2 PUFFS INTO THE LUNGS EVERY 6 HOURS AS NEEDED   No facility-administered encounter medications on file as of 09/20/2020.    Allergies (verified) Amoxicillin, Niacin and related, Penicillins, Pravastatin sodium, and Zetia [ezetimibe]   History: Past Medical History:  Diagnosis Date   Anemia    Anxiety    Asthma    Back pain    Chronic kidney disease    Detached retina, bilateral    March 2020   GERD (gastroesophageal reflux disease)    Hypertension    Kidney stone    Retinal detachment 03/31/2018   Rt eye 03/29/18   Rupture long head biceps tendon 03/22/2017   Vitreous detachment of both eyes 03/31/2018   03/29/18   Past Surgical History:  Procedure Laterality Date   COLONOSCOPY W/ BIOPSIES AND POLYPECTOMY     was  to have 1 this past year but planing to see new GI in [place] realize he may need onne every 5 years   HYDROCELE EXCISION / REPAIR     x3 or 2 on 1 and 1 on the other   [place] Bilateral    SPINAL FUSION     2020 and 2021   SPINE SURGERY  01/12/2004   l spine fusion/rods   VASECTOMY     Family History  Problem Relation Age of Onset   Cancer Mother        colon   Cancer Father        colon, prostate    Diabetes Father    Prostate cancer Father    Diabetes Maternal Grandmother    Social History   Socioeconomic History   Marital status: Divorced    Spouse name: Not on file   Number of children: 4   Years of education: 13   Highest education level: Some college, no degree  Occupational History   Occupation: retired  Tobacco Use   Smoking status: Every Day    Packs/day: 1.50    Years: 35.00    Pack years: 52.50    Types: Cigarettes   Smokeless tobacco: Never   Tobacco comments:    Not ready to quit at this time   Vaping Use   Vaping Use: Former   Substances: Nicotine, Flavoring  Substance and Sexual Activity   Alcohol use: Not Currently    Comment: upsets stomach   Drug use: No   Sexual activity: Not on file  Other Topics Concern   Not on file  Social History Narrative   09/20/2020      Lives alone divorced with 4 children - hobbies stocks bonds    Social Determinants of Health   Financial Resource Strain: Not on file  Food Insecurity: No Food Insecurity   Worried About Charity fundraiser in the Last Year: Never true   Arboriculturist in the Last Year: Never true  Transportation Needs: No Transportation Needs   Lack of Transportation (Medical): No   Lack of Transportation (Non-Medical): No  Physical Activity: Not on file  Stress: Not on file  Social Connections: Not on file    Tobacco Counseling Ready to quit: No Counseling given: Not Answered Tobacco comments: Not ready to quit at this time - is making efforts and taking one problem at a time    Clinical Intake:  Pre-visit preparation completed: Yes  Pain : 0-10 Pain Type: Chronic pain Pain Location: Other (Comment) (back/neck shoulder pain) Pain Onset: Other (comment) (going on for years since spinal fusion) Pain Relieving Factors: tramadol/hot/cold compresses Effect of Pain on Daily Activities: some  Pain Relieving Factors: tramadol/hot/cold compresses  BMI - recorded:  (weight today  140lbs) Nutritional Risks: None Diabetes: No  How often do you need to have someone help you when you read instructions, pamphlets, or other written materials from your doctor or pharmacy?: 1 - Never What is the last grade level you completed in school?: Graduated high school with some college  Diabetic? No  Interpreter Needed?: No  Information entered by :: Kassia Demarinis, CMA   Activities of Daily Living In your present state of health, do you have any difficulty performing the following activities: 09/20/2020  Hearing? N  Vision? Y  Comment depth perception (retina surgery)  Difficulty concentrating or making decisions? Y  Comment occasionally due to vision problems - takes his time and thinks situations through  Walking or climbing stairs? N  Dressing or bathing? N  Doing errands, shopping? N  Preparing Food and eating ? N  Using the Toilet? N  In the past six months, have you accidently leaked urine? N  Do you have problems with loss of bowel control? N  Managing your Medications? N  Managing your Finances? N  Housekeeping or managing your Housekeeping? N  Some recent data might be hidden    Patient Care Team: Luetta Nutting, DO as PCP - General (Family Medicine) Glenna Fellows, MD as Attending Physician (Neurosurgery) Dr. Lelan Pons  Indicate any recent Medical Services you may have received from other than [facility] providers in the past year (date may be approximate).     Assessment:   This is a routine wellness examination for Jimmy Mayo.  Hearing/Vision screen Hearing Screening - Comments:: Regular hearing screening can hear well  Vision Screening - Comments:: Detached retina surgery - sees [facility] eye specialist Dr Celesta Aver   Dietary issues and exercise activities discussed: Current Exercise Habits: Home exercise routine, Type of exercise: walking, Time (Minutes): 20, Frequency (Times/Week): 3, Weekly Exercise (Minutes/Week): 60, Intensity: Mild, Exercise limited by: Other  - see comments (exercised a few times weekly)   Goals Addressed             This Visit's Progress    Activity and Exercise Increased       Evidence-based guidance:  Review current exercise levels.  Assess patient perspective on exercise or activity level, barriers to increasing activity, motivation and readiness for change.  Recommend or set healthy exercise goal based on individual tolerance.  Encourage small steps toward making change in amount of exercise or activity.  Urge reduction of sedentary activities or screen time.  Promote group activities within the community or with family or support person.  Consider referral to rehabiliation therapist for assessment and exercise/activity plan.   Notes: has trouble seeing (detached bilateral retina)        Depression Screen PHQ 2/9 Scores 09/20/2020 12/18/2019 04/04/2019 02/28/2019 09/19/2018 03/01/2018 11/16/2017  PHQ - 2 Score '1 2 2 '$ - '2 2 2  '$ PHQ- 9 Score - 8 7 - '7 6 10  '$ Exception Documentation (No Data) - - Patient refusal - - -    Fall Risk Fall Risk  09/20/2020 12/18/2019 04/04/2019 03/01/2018 10/21/2015  Falls in the past year? 0 1 1 0 Yes  Number falls in past yr: - 1 1 0 1  Injury with Fall? - 0 0 0 No  Risk for fall due to : Impaired vision History of fall(s) History of fall(s);Impaired balance/gait - -  Risk for fall due to: Comment detached retina surgery - sees Dr Celesta Aver - - - -  Follow up Falls evaluation completed;Falls prevention discussed Falls evaluation completed;Falls prevention discussed - Falls evaluation completed Education provided    FALL RISK PREVENTION PERTAINING TO THE HOME:  Any stairs in or around the home? No  If so, are there any without handrails?  N/A Home free of loose throw rugs in walkways, pet beds, electrical cords, etc? Yes  Adequate lighting in your home to reduce risk of falls? Yes   ASSISTIVE DEVICES UTILIZED TO PREVENT FALLS:  Life alert? No  Use of a cane, walker or w/c? No  Grab bars  in the bathroom? No  Shower chair or bench in shower? No  Elevated toilet  seat or a handicapped toilet? No   TIMED UP AND GO:  Was the test performed?  phone appt N/A .    Cognitive Function:     6CIT Screen 09/20/2020 10/21/2015  What Year? 0 points 0 points  What month? 0 points 0 points  What time? 0 points 0 points  Count back from 20 0 points 0 points  Months in reverse - 0 points  Repeat phrase - 2 points  Total Score - 2    Immunizations Immunization History  Administered Date(s) Administered   Fluad Quad(high Dose 65+) 09/19/2018, 12/18/2019   H1N1 12/26/2007   Influenza Split 09/29/2010, 10/21/2011   Influenza Whole 01/04/2007, 12/09/2008, 10/15/2009   Influenza, High Dose Seasonal PF 11/04/2016, 11/16/2017   Influenza,inj,Quad PF,6+ Mos 09/27/2012, 10/18/2013, 12/11/2014, 09/23/2015   Moderna Sars-Covid-2 Vaccination 06/12/2019, 07/10/2019   Pneumococcal Conjugate-13 09/19/2018   Pneumococcal Polysaccharide-23 12/29/2011, 03/08/2017   Td 08/02/2007   Tdap 09/07/2017   Zoster, Live 01/07/2012    TDAP status: Up to date  Flu Vaccine status: Due, Education has been provided regarding the importance of this vaccine. Advised may receive this vaccine at local pharmacy or Health Dept. Aware to provide a copy of the vaccination record if obtained from local pharmacy or Health Dept. Verbalized acceptance and understanding.  Pneumococcal vaccine status: Up to date  Covid-19 vaccine status: Completed vaccines  Qualifies for Shingles Vaccine? Yes   Zostavax completed Yes     Screening Tests Health Maintenance  Topic Date Due   Zoster Vaccines- Shingrix (1 of 2) Never done   COVID-19 Vaccine (3 - Moderna risk series) 08/07/2019   INFLUENZA VACCINE  08/11/2020   COLONOSCOPY (Pts 45-34yr Insurance coverage will need to be confirmed)  06/01/2022   TETANUS/TDAP  09/08/2027   Hepatitis C Screening  Completed   PNA vac Low Risk Adult  Completed   HPV VACCINES  Aged  Out    Health Maintenance  Health Maintenance Due  Topic Date Due   Zoster Vaccines- Shingrix (1 of 2) Never done   COVID-19 Vaccine (3 - Moderna risk series) 08/07/2019   INFLUENZA VACCINE  08/11/2020    Colorectal cancer screening: Type of screening: Colonoscopy. Completed 05/31/2012. Repeat every 10 years  Lung Cancer Screening: (Low Dose CT Chest recommended if Age 69-80years, 30 pack-year currently smoking OR have quit w/in 15years.) does qualify.   Lung Cancer Screening Referral: will discuss with pcp   Additional Screening:  Hepatitis C Screening: does qualify; Completed 10/14/2015  Vision Screening: Recommended annual ophthalmology exams for early detection of glaucoma and other disorders of the eye. Is the patient up to date with their annual eye exam?  Yes  Who is the provider or what is the name of the office in which the patient attends annual eye exams? Yes Dr RMarvel Plan   Dental Screening: Recommended annual dental exams for proper oral hygiene  Community Resource Referral / Chronic Care Management: CRR required this visit?  No   CCM required this visit?  No      Plan:     I have personally reviewed and noted the following in the patient's chart:   Medical and social history Use of alcohol, tobacco or illicit drugs  Current medications and supplements including opioid prescriptions. Patient is not currently taking opioid prescriptions. Functional ability and status Nutritional status Physical activity Advanced directives List of other physicians Hospitalizations, surgeries, and ER visits in previous 12 months Vitals Screenings to include cognitive, depression, and falls Referrals  and appointments  In addition, I have reviewed and discussed with patient certain preventive protocols, quality metrics, and best practice recommendations. A written personalized care plan for preventive services as well as general preventive health recommendations were  provided to patient.     Massie Maroon, [place]   09/20/2020   Nurse Notes: Jimmy Mayo would like office visit for physical and labs sooner that December if possible

## 2020-09-20 NOTE — Patient Instructions (Signed)
Jimmy Mayo , Thank you for taking time to come for your Medicare Wellness Visit. I appreciate your ongoing commitment to your health goals. Please review the following plan we discussed and let me know if I can assist you in the future.     Screening recommendations/referrals: Colonoscopy: due 06/01/2022 Recommended yearly ophthalmology/optometry visit for glaucoma screening and checkup  Recommended yearly dental visit for hygiene and checkup  Vaccinations: Influenza vaccine: Due Pneumococcal vaccine: Completed  Tdap vaccine: Due August of 2029 Shingles vaccine: Completed    Covid-19: Completed Moderna series   Advanced directives: On file  Conditions/risks identified: None  Next appointment: Follow up in one year for your annual wellness visit.   Preventive Care 34 Years and Older, Male Preventive care refers to lifestyle choices and visits with your health care provider that can promote health and wellness. What does preventive care include? A yearly physical exam. This is also called an annual well check. Dental exams once or twice a year. Routine eye exams. Ask your health care provider how often you should have your eyes checked. Personal lifestyle choices, including: Daily care of your teeth and gums. Regular physical activity. Eating a healthy diet. Avoiding tobacco and drug use. Limiting alcohol use. Practicing safe sex. Taking low doses of aspirin every day. Taking vitamin and mineral supplements as recommended by your health care provider. What happens during an annual well check? The services and screenings done by your health care provider during your annual well check will depend on your age, overall health, lifestyle risk factors, and family history of disease. Counseling  Your health care provider may ask you questions about your: Alcohol use. Tobacco use. Drug use. Emotional well-being. Home and relationship well-being. Sexual activity. Eating  habits. History of falls. Memory and ability to understand (cognition). Work and work Statistician. Screening  You may have the following tests or measurements: Height, weight, and BMI. Blood pressure. Lipid and cholesterol levels. These may be checked every 5 years, or more frequently if you are over 36 years old. Skin check. Lung cancer screening. You may have this screening every year starting at age 71 if you have a 30-pack-year history of smoking and currently smoke or have quit within the past 15 years. Fecal occult blood test (FOBT) of the stool. You may have this test every year starting at age 32. Flexible sigmoidoscopy or colonoscopy. You may have a sigmoidoscopy every 5 years or a colonoscopy every 10 years starting at age 33. Prostate cancer screening. Recommendations will vary depending on your family history and other risks. Hepatitis C blood test. Hepatitis B blood test. Sexually transmitted disease (STD) testing. Diabetes screening. This is done by checking your blood sugar (glucose) after you have not eaten for a while (fasting). You may have this done every 1-3 years. Abdominal aortic aneurysm (AAA) screening. You may need this if you are a current or former smoker. Osteoporosis. You may be screened starting at age 33 if you are at high risk. Talk with your health care provider about your test results, treatment options, and if necessary, the need for more tests. Vaccines  Your health care provider may recommend certain vaccines, such as: Influenza vaccine. This is recommended every year. Tetanus, diphtheria, and acellular pertussis (Tdap, Td) vaccine. You may need a Td booster every 10 years. Zoster vaccine. You may need this after age 103. Pneumococcal 13-valent conjugate (PCV13) vaccine. One dose is recommended after age 52. Pneumococcal polysaccharide (PPSV23) vaccine. One dose is recommended after age  34. Talk to your health care provider about which screenings and  vaccines you need and how often you need them. This information is not intended to replace advice given to you by your health care provider. Make sure you discuss any questions you have with your health care provider. Document Released: 01/24/2015 Document Revised: 09/17/2015 Document Reviewed: 10/29/2014 Elsevier Interactive Patient Education  2017 [employer] Prevention in the Home Falls can cause injuries. They can happen to people of all ages. There are many things you can do to make your home safe and to help prevent falls. What can I do on the outside of my home? Regularly fix the edges of walkways and driveways and fix any cracks. Remove anything that might make you trip as you walk through a door, such as a raised step or threshold. Trim any bushes or trees on the path to your home. Use bright outdoor lighting. Clear any walking paths of anything that might make someone trip, such as rocks or tools. Regularly check to see if handrails are loose or broken. Make sure that both sides of any steps have handrails. Any raised decks and porches should have guardrails on the edges. Have any leaves, snow, or ice cleared regularly. Use sand or salt on walking paths during winter. Clean up any spills in your garage right away. This includes oil or grease spills. What can I do in the bathroom? Use night lights. Install grab bars by the toilet and in the tub and shower. Do not use towel bars as grab bars. Use non-skid mats or decals in the tub or shower. If you need to sit down in the shower, use a plastic, non-slip stool. Keep the floor dry. Clean up any water that spills on the floor as soon as it happens. Remove soap buildup in the tub or shower regularly. Attach bath mats securely with double-sided non-slip rug tape. Do not have throw rugs and other things on the floor that can make you trip. What can I do in the bedroom? Use night lights. Make sure that you have a light by your  bed that is easy to reach. Do not use any sheets or blankets that are too big for your bed. They should not hang down onto the floor. Have a firm chair that has side arms. You can use this for support while you get dressed. Do not have throw rugs and other things on the floor that can make you trip. What can I do in the kitchen? Clean up any spills right away. Avoid walking on wet floors. Keep items that you use a lot in easy-to-reach places. If you need to reach something above you, use a strong step stool that has a grab bar. Keep electrical cords out of the way. Do not use floor polish or wax that makes floors slippery. If you must use wax, use non-skid floor wax. Do not have throw rugs and other things on the floor that can make you trip. What can I do with my stairs? Do not leave any items on the stairs. Make sure that there are handrails on both sides of the stairs and use them. Fix handrails that are broken or loose. Make sure that handrails are as long as the stairways. Check any carpeting to make sure that it is firmly attached to the stairs. Fix any carpet that is loose or worn. Avoid having throw rugs at the top or bottom of the stairs. If you do have  throw rugs, attach them to the floor with carpet tape. Make sure that you have a light switch at the top of the stairs and the bottom of the stairs. If you do not have them, ask someone to add them for you. What else can I do to help prevent falls? Wear shoes that: Do not have high heels. Have rubber bottoms. Are comfortable and fit you well. Are closed at the toe. Do not wear sandals. If you use a stepladder: Make sure that it is fully opened. Do not climb a closed stepladder. Make sure that both sides of the stepladder are locked into place. Ask someone to hold it for you, if possible. Clearly mark and make sure that you can see: Any grab bars or handrails. First and last steps. Where the edge of each step is. Use tools that  help you move around (mobility aids) if they are needed. These include: Canes. Walkers. Scooters. Crutches. Turn on the lights when you go into a dark area. Replace any light bulbs as soon as they burn out. Set up your furniture so you have a clear path. Avoid moving your furniture around. If any of your floors are uneven, fix them. If there are any pets around you, be aware of where they are. Review your medicines with your doctor. Some medicines can make you feel dizzy. This can increase your chance of falling. Ask your doctor what other things that you can do to help prevent falls. This information is not intended to replace advice given to you by your health care provider. Make sure you discuss any questions you have with your health care provider. Document Released: 10/24/2008 Document Revised: 06/05/2015 Document Reviewed: 02/01/2014 Elsevier Interactive Patient Education  2017 [employer].

## 2020-09-23 ENCOUNTER — Other Ambulatory Visit: Payer: Self-pay | Admitting: Family Medicine

## 2020-09-25 ENCOUNTER — Other Ambulatory Visit: Payer: Self-pay | Admitting: Family Medicine

## 2020-09-30 ENCOUNTER — Telehealth: Payer: Self-pay | Admitting: Lab

## 2020-09-30 NOTE — Progress Notes (Signed)
  Chronic Care Management   Outreach Note  09/30/2020 Name: Jimmy Mayo MRN: [id number] DOB: 1950-05-20  Referred by: Luetta Nutting, DO Reason for referral : Medication Management   A second unsuccessful telephone outreach was attempted today. The patient was referred to pharmacist for assistance with care management and care coordination.  Follow Up Plan:   [place]

## 2020-10-13 ENCOUNTER — Other Ambulatory Visit: Payer: Self-pay | Admitting: Family Medicine

## 2020-10-19 ENCOUNTER — Other Ambulatory Visit: Payer: Self-pay | Admitting: Family Medicine

## 2020-11-13 ENCOUNTER — Other Ambulatory Visit: Payer: Self-pay

## 2020-11-13 ENCOUNTER — Other Ambulatory Visit: Payer: Self-pay | Admitting: Family Medicine

## 2020-11-13 DIAGNOSIS — Z0000 Encounter for general adult medical examination without abnormal findings: Secondary | ICD-10-CM

## 2020-11-13 DIAGNOSIS — K269 Duodenal ulcer, unspecified as acute or chronic, without hemorrhage or perforation: Secondary | ICD-10-CM

## 2020-11-13 MED ORDER — PANTOPRAZOLE SODIUM 40 MG PO TBEC
40.0000 mg | DELAYED_RELEASE_TABLET | Freq: Two times a day (BID) | ORAL | 0 refills | Status: DC
Start: 2020-11-13 — End: 2021-01-01

## 2020-11-13 NOTE — Progress Notes (Signed)
Pt requesting medication.  Has not scheduled follow-up appt.  Forwarded to Dr. Madilyn Fireman (covering for Dr. Zigmund Daniel) for decision

## 2020-11-13 NOTE — Progress Notes (Signed)
Meds ordered this encounter  Medications   pantoprazole (PROTONIX) 40 MG tablet    Sig: Take 1 tablet (40 mg total) by mouth 2 (two) times daily.    Dispense:  60 tablet    Refill:  0   Needs appt for yearly f/u. Last seen 12/2019

## 2020-11-14 NOTE — Progress Notes (Signed)
Please advise pt that labs have been ordered.

## 2020-11-14 NOTE — Progress Notes (Signed)
Annual scheduled December 22nd of this year with PCP. Patient wanted to make sure blood work was put in for this visit so he can have labs done a week prior. AM

## 2020-11-14 NOTE — Addendum Note (Signed)
Addended by: Leontine Locket Z on: 11/14/2020 11:10 AM   Modules accepted: Orders

## 2020-11-14 NOTE — Progress Notes (Signed)
Please contact the patient to schedule yearly appointment due in December.

## 2020-11-17 ENCOUNTER — Other Ambulatory Visit: Payer: Self-pay | Admitting: Family Medicine

## 2020-11-23 ENCOUNTER — Other Ambulatory Visit: Payer: Self-pay | Admitting: Family Medicine

## 2020-11-23 DIAGNOSIS — K269 Duodenal ulcer, unspecified as acute or chronic, without hemorrhage or perforation: Secondary | ICD-10-CM

## 2020-12-22 ENCOUNTER — Other Ambulatory Visit: Payer: Self-pay | Admitting: Family Medicine

## 2020-12-23 NOTE — Telephone Encounter (Signed)
Routing to covering provider.  °

## 2020-12-28 ENCOUNTER — Other Ambulatory Visit: Payer: Self-pay | Admitting: Family Medicine

## 2020-12-29 ENCOUNTER — Other Ambulatory Visit: Payer: Self-pay

## 2020-12-29 DIAGNOSIS — Z0000 Encounter for general adult medical examination without abnormal findings: Secondary | ICD-10-CM

## 2020-12-30 LAB — COMPLETE METABOLIC PANEL WITH GFR
AG Ratio: 2 (calc) (ref 1.0–2.5)
ALT: 23 U/L (ref 9–46)
AST: 19 U/L (ref 10–35)
Albumin: 4.7 g/dL (ref 3.6–5.1)
Alkaline phosphatase (APISO): 74 U/L (ref 35–144)
BUN: 14 mg/dL (ref 7–25)
CO2: 30 mmol/L (ref 20–32)
Calcium: 10.1 mg/dL (ref 8.6–10.3)
Chloride: 102 mmol/L (ref 98–110)
Creat: 0.86 mg/dL (ref 0.70–1.35)
Globulin: 2.3 g/dL (calc) (ref 1.9–3.7)
Glucose, Bld: 103 mg/dL — ABNORMAL HIGH (ref 65–99)
Potassium: 4.8 mmol/L (ref 3.5–5.3)
Sodium: 141 mmol/L (ref 135–146)
Total Bilirubin: 0.8 mg/dL (ref 0.2–1.2)
Total Protein: 7 g/dL (ref 6.1–8.1)
eGFR: 94 mL/min/{1.73_m2} (ref >=60)

## 2020-12-30 LAB — LIPID PANEL
Cholesterol: 211 mg/dL — ABNORMAL HIGH (ref <200)
HDL: 37 mg/dL — ABNORMAL LOW (ref >=40)
LDL Cholesterol (Calc): 150 mg/dL (calc) — ABNORMAL HIGH
Non-HDL Cholesterol (Calc): 174 mg/dL (calc) — ABNORMAL HIGH (ref <130)
Total CHOL/HDL Ratio: 5.7 (calc) — ABNORMAL HIGH (ref <5.0)
Triglycerides: 122 mg/dL (ref <150)

## 2020-12-30 LAB — CBC WITH DIFFERENTIAL/PLATELET
Absolute Monocytes: 624 cells/uL (ref 200–950)
Basophils Absolute: 48 cells/uL (ref 0–200)
Basophils Relative: 0.6 %
Eosinophils Absolute: 160 cells/uL (ref 15–500)
Eosinophils Relative: 2 %
HCT: 47 % (ref 38.5–50.0)
Hemoglobin: 15.6 g/dL (ref 13.2–17.1)
Lymphs Abs: 1832 cells/uL (ref 850–3900)
MCH: 29.7 pg (ref 27.0–33.0)
MCHC: 33.2 g/dL (ref 32.0–36.0)
MCV: 89.5 fL (ref 80.0–100.0)
MPV: 11.8 fL (ref 7.5–12.5)
Monocytes Relative: 7.8 %
Neutro Abs: 5336 cells/uL (ref 1500–7800)
Neutrophils Relative %: 66.7 %
Platelets: 306 10*3/uL (ref 140–400)
RBC: 5.25 10*6/uL (ref 4.20–5.80)
RDW: 13.1 % (ref 11.0–15.0)
Total Lymphocyte: 22.9 %
WBC: 8 10*3/uL (ref 3.8–10.8)

## 2020-12-30 LAB — PSA: PSA: 4.7 ng/mL — ABNORMAL HIGH (ref <=4.00)

## 2021-01-01 ENCOUNTER — Ambulatory Visit (INDEPENDENT_AMBULATORY_CARE_PROVIDER_SITE_OTHER): Payer: Medicare Other | Admitting: Family Medicine

## 2021-01-01 ENCOUNTER — Other Ambulatory Visit: Payer: Self-pay | Admitting: Radiology

## 2021-01-01 ENCOUNTER — Encounter: Payer: Self-pay | Admitting: Family Medicine

## 2021-01-01 ENCOUNTER — Other Ambulatory Visit: Payer: Self-pay

## 2021-01-01 VITALS — BP 130/77 | HR 79 | Resp 20 | Ht 69.0 in | Wt 140.0 lb

## 2021-01-01 DIAGNOSIS — F17200 Nicotine dependence, unspecified, uncomplicated: Secondary | ICD-10-CM

## 2021-01-01 DIAGNOSIS — Z0000 Encounter for general adult medical examination without abnormal findings: Secondary | ICD-10-CM

## 2021-01-01 DIAGNOSIS — K269 Duodenal ulcer, unspecified as acute or chronic, without hemorrhage or perforation: Secondary | ICD-10-CM | POA: Diagnosis not present

## 2021-01-01 DIAGNOSIS — Z23 Encounter for immunization: Secondary | ICD-10-CM | POA: Diagnosis not present

## 2021-01-01 DIAGNOSIS — Z122 Encounter for screening for malignant neoplasm of respiratory organs: Secondary | ICD-10-CM

## 2021-01-01 DIAGNOSIS — R972 Elevated prostate specific antigen [PSA]: Secondary | ICD-10-CM

## 2021-01-01 MED ORDER — ROSUVASTATIN CALCIUM 20 MG PO TABS: 20.0000 mg | ORAL_TABLET | Freq: Every day | ORAL | 3 refills | Status: DC

## 2021-01-01 MED ORDER — FINASTERIDE 5 MG PO TABS: ORAL_TABLET | ORAL | 0 refills | Status: DC

## 2021-01-01 MED ORDER — PANTOPRAZOLE SODIUM 40 MG PO TBEC: 40.0000 mg | DELAYED_RELEASE_TABLET | Freq: Two times a day (BID) | ORAL | 3 refills | Status: DC

## 2021-01-01 MED ORDER — VALSARTAN-HYDROCHLOROTHIAZIDE 160-12.5 MG PO TABS: 1.0000 | ORAL_TABLET | Freq: Every day | ORAL | 3 refills | Status: DC

## 2021-01-01 NOTE — Patient Instructions (Signed)
Preventive Care 65 Years and Older, Male °Preventive care refers to lifestyle choices and visits with your health care provider that can promote health and wellness. Preventive care visits are also called wellness exams. °What can I expect for my preventive care visit? °Counseling °During your preventive care visit, your health care provider may ask about your: °Medical history, including: °Past medical problems. °Family medical history. °History of falls. °Current health, including: °Emotional well-being. °Home life and relationship well-being. °Sexual activity. °Memory and ability to understand (cognition). °Lifestyle, including: °Alcohol, nicotine or tobacco, and drug use. °Access to firearms. °Diet, exercise, and sleep habits. °Work and work environment. °Sunscreen use. °Safety issues such as seatbelt and bike helmet use. °Physical exam °Your health care provider will check your: °Height and weight. These may be used to calculate your BMI (body mass index). BMI is a measurement that tells if you are at a healthy weight. °Waist circumference. This measures the distance around your waistline. This measurement also tells if you are at a healthy weight and may help predict your risk of certain diseases, such as type 2 diabetes and high blood pressure. °Heart rate and blood pressure. °Body temperature. °Skin for abnormal spots. °What immunizations do I need? °Vaccines are usually given at various ages, according to a schedule. Your health care provider will recommend vaccines for you based on your age, medical history, and lifestyle or other factors, such as travel or where you work. °What tests do I need? °Screening °Your health care provider may recommend screening tests for certain conditions. This may include: °Lipid and cholesterol levels. °Diabetes screening. This is done by checking your blood sugar (glucose) after you have not eaten for a while (fasting). °Hepatitis C test. °Hepatitis B test. °HIV (human  immunodeficiency virus) test. °STI (sexually transmitted infection) testing, if you are at risk. °Lung cancer screening. °Colorectal cancer screening. °Prostate cancer screening. °Abdominal aortic aneurysm (AAA) screening. You may need this if you are a current or former smoker. °Talk with your health care provider about your test results, treatment options, and if necessary, the need for more tests. °Follow these instructions at home: °Eating and drinking ° °Eat a diet that includes fresh fruits and vegetables, whole grains, lean protein, and low-fat dairy products. Limit your intake of foods with high amounts of sugar, saturated fats, and salt. °Take vitamin and mineral supplements as recommended by your health care provider. °Do not drink alcohol if your health care provider tells you not to drink. °If you drink alcohol: °Limit how much you have to 0-2 drinks a day. °Know how much alcohol is in your drink. In the [place] one drink equals one 12 oz bottle of beer (355 mL), one 5 oz glass of wine (148 mL), or one 1½ oz glass of hard liquor (44 mL). °Lifestyle °Brush your teeth every morning and night with fluoride toothpaste. Floss one time each day. °Exercise for at least 30 minutes 5 or more days each week. °Do not use any products that contain nicotine or tobacco. These products include cigarettes, chewing tobacco, and vaping devices, such as e-cigarettes. If you need help quitting, ask your health care provider. °Do not use drugs. °If you are sexually active, practice safe sex. Use a condom or other form of protection to prevent STIs. °Take aspirin only as told by your health care provider. Make sure that you understand how much to take and what form to take. Work with your health care provider to find out whether it is safe and   beneficial for you to take aspirin daily. °Ask your health care provider if you need to take a cholesterol-lowering medicine (statin). °Find healthy ways to manage stress, such  as: °Meditation, yoga, or listening to music. °Journaling. °Talking to a trusted person. °Spending time with friends and family. °Safety °Always wear your seat belt while driving or riding in a vehicle. °Do not drive: °If you have been drinking alcohol. Do not ride with someone who has been drinking. °When you are tired or distracted. °While texting. °If you have been using any mind-altering substances or drugs. °Wear a helmet and other protective equipment during sports activities. °If you have firearms in your house, make sure you follow all gun safety procedures. °Minimize exposure to UV radiation to reduce your risk of skin cancer. °What's next? °Visit your health care provider once a year for an annual wellness visit. °Ask your health care provider how often you should have your eyes and teeth checked. °Stay up to date on all vaccines. °This information is not intended to replace advice given to you by your health care provider. Make sure you discuss any questions you have with your health care provider. °Document Revised: 06/25/2020 Document Reviewed: 06/25/2020 °Elsevier Patient Education © 2022 [employer] ° °

## 2021-01-01 NOTE — Progress Notes (Signed)
Jimmy Mayo - 70 y.o. male MRN [id number]  Date of birth: Jul 30, 1950  Subjective Chief Complaint  Patient presents with   Annual Exam    HPI Jimmy Mayo is a 70 year old male here today for annual exam.  Reports overall he is doing well.  Continues to smoke approximate 1 pack/day.  He has been a smoker for approximately 45 years.    He does walk occasionally for exercise.  Feels like his diet is pretty healthy.    He had labs completed prior to visit.  PSA levels elevated.  PSA has been trending up.  He is currently taking finasteride for BPH symptoms.    Review of Systems  Constitutional:  Negative for chills, fever, malaise/fatigue and weight loss.  HENT:  Negative for congestion, ear pain and sore throat.   Eyes:  Negative for blurred vision, double vision and pain.  Respiratory:  Negative for cough and shortness of breath.   Cardiovascular:  Negative for chest pain and palpitations.  Gastrointestinal:  Negative for abdominal pain, blood in stool, constipation, heartburn and nausea.  Genitourinary:  Negative for dysuria and urgency.  Musculoskeletal:  Negative for joint pain and myalgias.  Neurological:  Negative for dizziness and headaches.  Endo/Heme/Allergies:  Does not bruise/bleed easily.  Psychiatric/Behavioral:  Negative for depression. The patient is not nervous/anxious and does not have insomnia.    Allergies  Allergen Reactions   Amoxicillin    Niacin And Related Other (See Comments)    Flushing    Penicillins    Pravastatin Sodium Other (See Comments)    REACTION: diarrhea   Zetia [Ezetimibe]     constipation    Past Medical History:  Diagnosis Date   Anemia    Anxiety    Asthma    Back pain    Chronic kidney disease    Detached retina, bilateral    March 2020   GERD (gastroesophageal reflux disease)    Hypertension    Kidney stone    Retinal detachment 03/31/2018   Rt eye 03/29/18   Rupture long head biceps tendon 03/22/2017   Vitreous detachment of  both eyes 03/31/2018   03/29/18    Past Surgical History:  Procedure Laterality Date   COLONOSCOPY W/ BIOPSIES AND POLYPECTOMY     was to have 1 this past year but planing to see new GI in [place] realize he may need onne every 5 years   HYDROCELE EXCISION / REPAIR     x3 or 2 on 1 and 1 on the other   [place] Bilateral    SPINAL FUSION     2020 and 2021   SPINE SURGERY  01/12/2004   l spine fusion/rods   VASECTOMY      Social History   Socioeconomic History   Marital status: Divorced    Spouse name: Not on file   Number of children: 4   Years of education: 13   Highest education level: Some college, no degree  Occupational History   Occupation: retired  Tobacco Use   Smoking status: Every Day    Packs/day: 1.50    Years: 35.00    Pack years: 52.50    Types: Cigarettes   Smokeless tobacco: Never   Tobacco comments:    Not ready to quit at this time   Vaping Use   Vaping Use: Former   Substances: Nicotine,  Flavoring  Substance and Sexual Activity   Alcohol use: Not Currently    Comment: upsets stomach   Drug use: No   Sexual activity: Not on file  Other Topics Concern   Not on file  Social History Narrative   09/20/2020      Lives alone divorced with 4 children - hobbies stocks bonds    Social Determinants of Health   Financial Resource Strain: Not on file  Food Insecurity: No Food Insecurity   Worried About Charity fundraiser in the Last Year: Never true   Arboriculturist in the Last Year: Never true  Transportation Needs: No Transportation Needs   Lack of Transportation (Medical): No   Lack of Transportation (Non-Medical): No  Physical Activity: Not on file  Stress: Not on file  Social Connections: Not on file    Family History  Problem Relation Age of Onset   Cancer Mother        colon   Cancer Father        colon, prostate   Diabetes Father     Prostate cancer Father    Diabetes Maternal Grandmother     Health Maintenance  Topic Date Due   COVID-19 Vaccine (3 - Moderna risk series) 01/17/2021 (Originally 08/07/2019)   Zoster Vaccines- Shingrix (1 of 2) 04/01/2021 (Originally 07/27/1970)   COLONOSCOPY (Pts 45-17yrs Insurance coverage will need to be confirmed)  06/01/2022   TETANUS/TDAP  09/08/2027   Pneumonia Vaccine 62+ Years old  Completed   INFLUENZA VACCINE  Completed   Hepatitis C Screening  Completed   HPV VACCINES  Aged Out     ----------------------------------------------------------------------------------------------------------------------------------------------------------------------------------------------------------------- Physical Exam BP 130/77 (BP Location: Left Arm, Patient Position: Sitting, Cuff Size: Normal)    Pulse 79    Resp 20    Ht 5\' 9"  (1.753 m)    Wt 140 lb (63.5 kg)    SpO2 99%    BMI 20.67 kg/m   Physical Exam Constitutional:      General: He is not in acute distress. HENT:     Head: Normocephalic and atraumatic.     Right Ear: Tympanic membrane and external ear normal.     Left Ear: Tympanic membrane and external ear normal.  Eyes:     General: No scleral icterus. Neck:     Thyroid: No thyromegaly.  Cardiovascular:     Rate and Rhythm: Normal rate and regular rhythm.     Heart sounds: Normal heart sounds.  Pulmonary:     Effort: Pulmonary effort is normal.     Breath sounds: Normal breath sounds.  Abdominal:     General: Bowel sounds are normal. There is no distension.     Palpations: Abdomen is soft.     Tenderness: There is no abdominal tenderness. There is no guarding.  Musculoskeletal:     Cervical back: Normal range of motion.  Lymphadenopathy:     Cervical: No cervical adenopathy.  Skin:    General: Skin is warm and dry.     Findings: No rash.  Neurological:     Mental Status: He is alert and oriented to person, place, and time.     Cranial Nerves: No cranial nerve  deficit.     Motor: No abnormal muscle tone.  Psychiatric:        Mood and Affect: Mood normal.        Behavior: Behavior normal.    ------------------------------------------------------------------------------------------------------------------------------------------------------------------------------------------------------------------- Assessment and Plan  Well adult exam Well adult Recent labs  reviewed with him.  Elevated PSA.  Rechecking this today as well as urinalysis.  If this remains elevated we will place referral to urology. Screenings: Discussed low-dose CT scan for lung cancer screening.  He would like to have this completed.  Orders placed. Will obtain ultrasound for aortic aneurysm screening. Immunizations: Flu vaccine given today. Anticipatory guidance/risk factor reduction: Counseled on smoking cessation.  Recommendations per AVS   Meds ordered this encounter  Medications   valsartan-hydrochlorothiazide (DIOVAN-HCT) 160-12.5 MG tablet    Sig: Take 1 tablet by mouth daily.    Dispense:  90 tablet    Refill:  3   pantoprazole (PROTONIX) 40 MG tablet    Sig: Take 1 tablet (40 mg total) by mouth 2 (two) times daily.    Dispense:  90 tablet    Refill:  3   finasteride (PROSCAR) 5 MG tablet    Sig: TAKE 1 TABLET BY MOUTH DAILY    Dispense:  90 tablet    Refill:  0    **Patient requests 90 days supply**   rosuvastatin (CRESTOR) 20 MG tablet    Sig: Take 1 tablet (20 mg total) by mouth daily.    Dispense:  90 tablet    Refill:  3    No follow-ups on file.    This visit occurred during the SARS-CoV-2 public health emergency.  Safety protocols were in place, including screening questions prior to the visit, additional usage of staff PPE, and extensive cleaning of exam room while observing appropriate contact time as indicated for disinfecting solutions.

## 2021-01-01 NOTE — Assessment & Plan Note (Signed)
Well adult Recent labs reviewed with him.  Elevated PSA.  Rechecking this today as well as urinalysis.  If this remains elevated we will place referral to urology. Screenings: Discussed low-dose CT scan for lung cancer screening.  He would like to have this completed.  Orders placed. Will obtain ultrasound for aortic aneurysm screening. Immunizations: Flu vaccine given today. Anticipatory guidance/risk factor reduction: Counseled on smoking cessation.  Recommendations per AVS

## 2021-01-02 ENCOUNTER — Other Ambulatory Visit: Payer: Self-pay | Admitting: Family Medicine

## 2021-01-02 DIAGNOSIS — R972 Elevated prostate specific antigen [PSA]: Secondary | ICD-10-CM

## 2021-01-02 LAB — URINALYSIS, ROUTINE W REFLEX MICROSCOPIC
Bilirubin Urine: NEGATIVE
Glucose, UA: NEGATIVE
Hgb urine dipstick: NEGATIVE
Leukocytes,Ua: NEGATIVE
Nitrite: NEGATIVE
Protein, ur: NEGATIVE
Specific Gravity, Urine: 1.021 (ref 1.001–1.035)
pH: 6 (ref 5.0–8.0)

## 2021-01-02 LAB — PSA: PSA: 4.83 ng/mL — ABNORMAL HIGH (ref <=4.00)

## 2021-01-20 ENCOUNTER — Ambulatory Visit (INDEPENDENT_AMBULATORY_CARE_PROVIDER_SITE_OTHER): Payer: Medicare Other

## 2021-01-20 ENCOUNTER — Other Ambulatory Visit: Payer: Self-pay

## 2021-01-20 DIAGNOSIS — F17210 Nicotine dependence, cigarettes, uncomplicated: Secondary | ICD-10-CM

## 2021-01-20 DIAGNOSIS — F17200 Nicotine dependence, unspecified, uncomplicated: Secondary | ICD-10-CM

## 2021-01-20 DIAGNOSIS — Z136 Encounter for screening for cardiovascular disorders: Secondary | ICD-10-CM | POA: Diagnosis not present

## 2021-01-23 ENCOUNTER — Other Ambulatory Visit: Payer: Self-pay | Admitting: Family Medicine

## 2021-01-27 ENCOUNTER — Other Ambulatory Visit: Payer: Self-pay | Admitting: Family Medicine

## 2021-02-03 ENCOUNTER — Other Ambulatory Visit: Payer: Self-pay | Admitting: Family Medicine

## 2021-03-05 ENCOUNTER — Other Ambulatory Visit: Payer: Self-pay | Admitting: Family Medicine

## 2021-04-01 DIAGNOSIS — R1011 Right upper quadrant pain: Secondary | ICD-10-CM | POA: Diagnosis not present

## 2021-04-01 DIAGNOSIS — Z86010 Personal history of colonic polyps: Secondary | ICD-10-CM | POA: Diagnosis not present

## 2021-04-02 ENCOUNTER — Other Ambulatory Visit: Payer: Self-pay | Admitting: Family Medicine

## 2021-04-06 DIAGNOSIS — N200 Calculus of kidney: Secondary | ICD-10-CM | POA: Diagnosis not present

## 2021-04-06 DIAGNOSIS — R1011 Right upper quadrant pain: Secondary | ICD-10-CM | POA: Diagnosis not present

## 2021-04-11 ENCOUNTER — Other Ambulatory Visit: Payer: Self-pay | Admitting: Family Medicine

## 2021-06-19 DIAGNOSIS — J45909 Unspecified asthma, uncomplicated: Secondary | ICD-10-CM | POA: Diagnosis not present

## 2021-06-19 DIAGNOSIS — Z86010 Personal history of colonic polyps: Secondary | ICD-10-CM | POA: Diagnosis not present

## 2021-06-19 DIAGNOSIS — D125 Benign neoplasm of sigmoid colon: Secondary | ICD-10-CM | POA: Diagnosis not present

## 2021-06-19 DIAGNOSIS — R1011 Right upper quadrant pain: Secondary | ICD-10-CM | POA: Diagnosis not present

## 2021-06-19 DIAGNOSIS — D123 Benign neoplasm of transverse colon: Secondary | ICD-10-CM | POA: Diagnosis not present

## 2021-06-19 DIAGNOSIS — D128 Benign neoplasm of rectum: Secondary | ICD-10-CM | POA: Diagnosis not present

## 2021-06-19 DIAGNOSIS — Z09 Encounter for follow-up examination after completed treatment for conditions other than malignant neoplasm: Secondary | ICD-10-CM | POA: Diagnosis not present

## 2021-06-19 DIAGNOSIS — Z1211 Encounter for screening for malignant neoplasm of colon: Secondary | ICD-10-CM | POA: Diagnosis not present

## 2021-06-27 ENCOUNTER — Other Ambulatory Visit: Payer: Self-pay | Admitting: Family Medicine

## 2021-06-27 DIAGNOSIS — K269 Duodenal ulcer, unspecified as acute or chronic, without hemorrhage or perforation: Secondary | ICD-10-CM

## 2021-07-02 ENCOUNTER — Other Ambulatory Visit: Payer: Self-pay | Admitting: Family Medicine

## 2021-07-07 ENCOUNTER — Other Ambulatory Visit: Payer: Self-pay | Admitting: Family Medicine

## 2021-07-09 ENCOUNTER — Other Ambulatory Visit: Payer: Self-pay

## 2021-07-12 MED ORDER — ALPRAZOLAM 0.5 MG PO TABS: 0.5000 mg | ORAL_TABLET | Freq: Three times a day (TID) | ORAL | 1 refills | Status: DC | PRN

## 2021-07-28 DIAGNOSIS — R972 Elevated prostate specific antigen [PSA]: Secondary | ICD-10-CM | POA: Insufficient documentation

## 2021-08-02 ENCOUNTER — Other Ambulatory Visit: Payer: Self-pay | Admitting: Family Medicine

## 2021-08-04 NOTE — Telephone Encounter (Signed)
Should have had 6 month follow up.  Please schedule.

## 2021-08-27 DIAGNOSIS — H527 Unspecified disorder of refraction: Secondary | ICD-10-CM | POA: Diagnosis not present

## 2021-08-27 DIAGNOSIS — H35371 Puckering of macula, right eye: Secondary | ICD-10-CM | POA: Diagnosis not present

## 2021-08-27 DIAGNOSIS — H40003 Preglaucoma, unspecified, bilateral: Secondary | ICD-10-CM | POA: Diagnosis not present

## 2021-09-02 ENCOUNTER — Other Ambulatory Visit: Payer: Self-pay | Admitting: Family Medicine

## 2021-09-10 ENCOUNTER — Other Ambulatory Visit: Payer: Self-pay

## 2021-09-10 ENCOUNTER — Telehealth: Payer: Self-pay

## 2021-09-10 ENCOUNTER — Other Ambulatory Visit: Payer: Self-pay | Admitting: Family Medicine

## 2021-09-10 NOTE — Telephone Encounter (Signed)
Pt called requesting a refill on tramadol  Last seen:01/01/21 Last fill:08/04/21

## 2021-09-10 NOTE — Telephone Encounter (Signed)
Last fill: 08/04/21 #90 Last OV: 01/01/21

## 2021-09-11 ENCOUNTER — Telehealth: Payer: Self-pay | Admitting: Family Medicine

## 2021-09-11 NOTE — Telephone Encounter (Signed)
Tramadol request for medication denied. Pt to be seen on 09/15/21. Thanks

## 2021-09-15 ENCOUNTER — Ambulatory Visit (INDEPENDENT_AMBULATORY_CARE_PROVIDER_SITE_OTHER): Payer: Medicare Other | Admitting: Family Medicine

## 2021-09-15 ENCOUNTER — Encounter: Payer: Self-pay | Admitting: Family Medicine

## 2021-09-15 VITALS — BP 111/74 | HR 72 | Ht 69.0 in | Wt 143.0 lb

## 2021-09-15 DIAGNOSIS — G894 Chronic pain syndrome: Secondary | ICD-10-CM | POA: Diagnosis not present

## 2021-09-15 DIAGNOSIS — R5383 Other fatigue: Secondary | ICD-10-CM

## 2021-09-15 DIAGNOSIS — R0600 Dyspnea, unspecified: Secondary | ICD-10-CM | POA: Diagnosis not present

## 2021-09-15 DIAGNOSIS — I10 Essential (primary) hypertension: Secondary | ICD-10-CM

## 2021-09-15 DIAGNOSIS — R7303 Prediabetes: Secondary | ICD-10-CM | POA: Diagnosis not present

## 2021-09-15 MED ORDER — TRAMADOL HCL 50 MG PO TABS: ORAL_TABLET | ORAL | 1 refills | Status: DC

## 2021-09-15 MED ORDER — ALPRAZOLAM 0.5 MG PO TABS
0.5000 mg | ORAL_TABLET | Freq: Three times a day (TID) | ORAL | 1 refills | Status: DC | PRN
Start: 2021-09-15 — End: 2022-06-01

## 2021-09-15 NOTE — Progress Notes (Signed)
EKG NSR.  Low voltage.  Possible age indeterminate anterior infarct.    Jimmy Mayo - 71 y.o. male MRN [id number]  Date of birth: 1950/08/25  Subjective Chief Complaint  Patient presents with   Shortness of Breath   Joint Swelling    HPI Jimmy Mayo is a 71 year old male here today for follow-up visit.  He also has complaint of increased dyspnea with some intermittent swelling in his lower extremities.  Dyspnea seems worse with exertional activities.  He has not had chest pain, fever, chills.  He has noticed some palpitations at times.  He also has some occasional dizzy feeling.  He has been on chronic tramadol for management of pain.  Stable at current strength.  ROS:  A comprehensive ROS was completed and negative except as noted per HPI   Allergies  Allergen Reactions   Amoxicillin    Niacin And Related Other (See Comments)    Flushing    Penicillins    Pravastatin Sodium Other (See Comments)    REACTION: diarrhea   Zetia [Ezetimibe]     constipation    Past Medical History:  Diagnosis Date   Anemia    Anxiety    Asthma    Back pain    Chronic kidney disease    Detached retina, bilateral    March 2020   GERD (gastroesophageal reflux disease)    Hypertension    Kidney stone    Retinal detachment 03/31/2018   Rt eye 03/29/18   Rupture long head biceps tendon 03/22/2017   Vitreous detachment of both eyes 03/31/2018   03/29/18    Past Surgical History:  Procedure Laterality Date   COLONOSCOPY W/ BIOPSIES AND POLYPECTOMY     was to have 1 this past year but planing to see new GI in [place] realize he may need onne every 5 years   HYDROCELE EXCISION / REPAIR     x3 or 2 on 1 and 1 on the other   KNEE ARTHROSCOPY     1986   LITHOTRIPSY     [place] Bilateral    SPINAL FUSION     2020 and 2021   SPINE SURGERY  01/12/2004   l spine fusion/rods   VASECTOMY      Social History   Socioeconomic History   Marital  status: Divorced    Spouse name: Not on file   Number of children: 4   Years of education: 13   Highest education level: Some college, no degree  Occupational History   Occupation: retired  Tobacco Use   Smoking status: Every Day    Packs/day: 1.50    Years: 35.00    Total pack years: 52.50    Types: Cigarettes   Smokeless tobacco: Never   Tobacco comments:    Not ready to quit at this time   Vaping Use   Vaping Use: Former   Substances: Nicotine, Flavoring  Substance and Sexual Activity   Alcohol use: Not Currently    Comment: upsets stomach   Drug use: No   Sexual activity: Not on file  Other Topics Concern   Not on file  Social History Narrative   09/20/2020      Lives alone divorced with 4 children - hobbies stocks bonds    Social Determinants of Health   Financial Resource Strain: Not on file  Food Insecurity: No Food Insecurity (09/20/2020)   Hunger Vital Sign    Worried About Running  Out of Food in the Last Year: Never true    Ran Out of Food in the Last Year: Never true  Transportation Needs: No Transportation Needs (09/20/2020)   PRAPARE - Hydrologist (Medical): No    Lack of Transportation (Non-Medical): No  Physical Activity: Not on file  Stress: Not on file  Social Connections: Not on file    Family History  Problem Relation Age of Onset   Cancer Mother        colon   Cancer Father        colon, prostate   Diabetes Father    Prostate cancer Father    Diabetes Maternal Grandmother     Health Maintenance  Topic Date Due   COVID-19 Vaccine (3 - Moderna risk series) 02/11/2022 (Originally 08/07/2019)   Zoster Vaccines- Shingrix (1 of 2) 02/11/2022 (Originally 07/27/1970)   INFLUENZA VACCINE  04/11/2022 (Originally 08/11/2021)   COLONOSCOPY (Pts 45-77yr Insurance coverage will need to be confirmed)  06/01/2022   TETANUS/TDAP  09/08/2027   Pneumonia Vaccine 70 Years old  Completed   Hepatitis C Screening  Completed   HPV  VACCINES  Aged Out     ----------------------------------------------------------------------------------------------------------------------------------------------------------------------------------------------------------------- Physical Exam BP 111/74 (BP Location: Left Arm, Patient Position: Sitting, Cuff Size: Normal)   Pulse 72   Ht '5\' 9"'$  (1.753 m)   Wt 143 lb (64.9 kg)   SpO2 98%   BMI 21.12 kg/m   Physical Exam Constitutional:      Appearance: Normal appearance. He is well-developed.  Eyes:     General: No scleral icterus. Cardiovascular:     Rate and Rhythm: Normal rate and regular rhythm.  Pulmonary:     Effort: Pulmonary effort is normal.     Breath sounds: Normal breath sounds.  Skin:    Comments: Trace bilateral edema  Neurological:     Mental Status: He is alert.  Psychiatric:        Mood and Affect: Mood normal.        Behavior: Behavior normal.     ------------------------------------------------------------------------------------------------------------------------------------------------------------------------------------------------------------------- Assessment and Plan  Essential hypertension, benign Blood pressure is at goal at for age and co-morbidities.  I recommend continuation of current medications.  In addition they were instructed to follow a low sodium diet with regular exercise to help to maintain adequate control of blood pressure.    Dyspnea EKG with normal sinus rhythm.  Possible age indeterminant anterior infarct.  Labs ordered today including CMP, CBC, BNP.  Echocardiogram ordered.  Chronic pain Remains on tramadol chronically for management of back pain.  Continue this.  No signs of abuse at this time.   Meds ordered this encounter  Medications   ALPRAZolam (XANAX) 0.5 MG tablet    Sig: Take 1 tablet (0.5 mg total) by mouth 3 (three) times daily as needed.    Dispense:  90 tablet    Refill:  1   traMADol (ULTRAM) 50 MG  tablet    Sig: TAKE 1 TABLET(50 MG) BY MOUTH EVERY 8 HOURS AS NEEDED    Dispense:  90 tablet    Refill:  1    No follow-ups on file.    This visit occurred during the SARS-CoV-2 public health emergency.  Safety protocols were in place, including screening questions prior to the visit, additional usage of staff PPE, and extensive cleaning of exam room while observing appropriate contact time as indicated for disinfecting solutions.

## 2021-09-15 NOTE — Assessment & Plan Note (Signed)
Blood pressure is at goal at for age and co-morbidities.  I recommend continuation of current medications.  In addition they were instructed to follow a low sodium diet with regular exercise to help to maintain adequate control of blood pressure.  ? ?

## 2021-09-15 NOTE — Assessment & Plan Note (Signed)
EKG with normal sinus rhythm.  Possible age indeterminant anterior infarct.  Labs ordered today including CMP, CBC, BNP.  Echocardiogram ordered.

## 2021-09-15 NOTE — Assessment & Plan Note (Signed)
Remains on tramadol chronically for management of back pain.  Continue this.  No signs of abuse at this time.

## 2021-09-15 NOTE — Patient Instructions (Signed)
We'll be in touch with labs.  You will be contacted to set up echocardiogram.  If you have worsening shortness of breath or chest paino go to the ER.

## 2021-09-16 LAB — HEMOGLOBIN A1C
Hgb A1c MFr Bld: 5.5 % of total Hgb (ref <5.7)
Mean Plasma Glucose: 111 mg/dL
eAG (mmol/L): 6.2 mmol/L

## 2021-09-16 LAB — BRAIN NATRIURETIC PEPTIDE: Brain Natriuretic Peptide: 8 pg/mL (ref <100)

## 2021-09-16 LAB — CBC WITH DIFFERENTIAL/PLATELET
Absolute Monocytes: 782 cells/uL (ref 200–950)
Basophils Absolute: 50 cells/uL (ref 0–200)
Basophils Relative: 0.5 %
Eosinophils Absolute: 248 cells/uL (ref 15–500)
Eosinophils Relative: 2.5 %
HCT: 45.6 % (ref 38.5–50.0)
Hemoglobin: 14.9 g/dL (ref 13.2–17.1)
Lymphs Abs: 2208 cells/uL (ref 850–3900)
MCH: 30.3 pg (ref 27.0–33.0)
MCHC: 32.7 g/dL (ref 32.0–36.0)
MCV: 92.9 fL (ref 80.0–100.0)
MPV: 11.5 fL (ref 7.5–12.5)
Monocytes Relative: 7.9 %
Neutro Abs: 6613 cells/uL (ref 1500–7800)
Neutrophils Relative %: 66.8 %
Platelets: 279 10*3/uL (ref 140–400)
RBC: 4.91 10*6/uL (ref 4.20–5.80)
RDW: 13.1 % (ref 11.0–15.0)
Total Lymphocyte: 22.3 %
WBC: 9.9 10*3/uL (ref 3.8–10.8)

## 2021-09-16 LAB — COMPLETE METABOLIC PANEL WITH GFR
AG Ratio: 2.1 (calc) (ref 1.0–2.5)
ALT: 24 U/L (ref 9–46)
AST: 20 U/L (ref 10–35)
Albumin: 4.9 g/dL (ref 3.6–5.1)
Alkaline phosphatase (APISO): 56 U/L (ref 35–144)
BUN: 15 mg/dL (ref 7–25)
CO2: 30 mmol/L (ref 20–32)
Calcium: 10.2 mg/dL (ref 8.6–10.3)
Chloride: 102 mmol/L (ref 98–110)
Creat: 0.84 mg/dL (ref 0.70–1.28)
Globulin: 2.3 g/dL (calc) (ref 1.9–3.7)
Glucose, Bld: 91 mg/dL (ref 65–139)
Potassium: 4.3 mmol/L (ref 3.5–5.3)
Sodium: 142 mmol/L (ref 135–146)
Total Bilirubin: 0.6 mg/dL (ref 0.2–1.2)
Total Protein: 7.2 g/dL (ref 6.1–8.1)
eGFR: 94 mL/min/{1.73_m2} (ref >=60)

## 2021-09-16 LAB — VITAMIN B12: Vitamin B-12: 1055 pg/mL (ref 200–1100)

## 2021-09-16 LAB — TSH: TSH: 1.19 mIU/L (ref 0.40–4.50)

## 2021-09-16 NOTE — Addendum Note (Signed)
Addended by: Narda Rutherford on: 09/16/2021 10:09 AM   Modules accepted: Orders

## 2021-09-18 ENCOUNTER — Other Ambulatory Visit: Payer: Self-pay

## 2021-09-18 DIAGNOSIS — R0600 Dyspnea, unspecified: Secondary | ICD-10-CM

## 2021-09-30 ENCOUNTER — Telehealth: Payer: Self-pay

## 2021-10-06 ENCOUNTER — Ambulatory Visit (HOSPITAL_COMMUNITY): Payer: Medicare Other | Attending: Family Medicine

## 2021-10-06 DIAGNOSIS — R0600 Dyspnea, unspecified: Secondary | ICD-10-CM | POA: Insufficient documentation

## 2021-10-06 LAB — ECHOCARDIOGRAM COMPLETE
Area-P 1/2: 1.7 cm2
S' Lateral: 3.2 cm

## 2021-10-07 ENCOUNTER — Other Ambulatory Visit: Payer: Self-pay | Admitting: Family Medicine

## 2021-10-08 ENCOUNTER — Encounter: Payer: Self-pay | Admitting: Family Medicine

## 2021-10-09 DIAGNOSIS — C61 Malignant neoplasm of prostate: Secondary | ICD-10-CM | POA: Insufficient documentation

## 2021-10-09 NOTE — Telephone Encounter (Signed)
Scheduled 10/13/2021 Mychart. Tvt

## 2021-10-10 ENCOUNTER — Other Ambulatory Visit: Payer: Self-pay | Admitting: Family Medicine

## 2021-10-12 NOTE — Telephone Encounter (Signed)
Patient called. States he was seen last week with oral surgeon. He was given ibuprofen '600mg'$   and clindamycin '150mg'$ . He states that after taking these medications the swelling in his ankle disappeared . He finished both medications 2 days ago and swelling nor pain has returned.  He just wanted to let Dr. Zigmund Daniel know.

## 2021-10-13 ENCOUNTER — Encounter: Payer: Self-pay | Admitting: Family Medicine

## 2021-10-13 ENCOUNTER — Telehealth (INDEPENDENT_AMBULATORY_CARE_PROVIDER_SITE_OTHER): Payer: Medicare Other | Admitting: Family Medicine

## 2021-10-13 VITALS — Ht 69.0 in | Wt 143.0 lb

## 2021-10-13 DIAGNOSIS — R0600 Dyspnea, unspecified: Secondary | ICD-10-CM

## 2021-10-13 NOTE — Progress Notes (Signed)
SOB is better after ABX a couple weeks ago. Having some difficulty now. Swollen ankles started last night.

## 2021-10-13 NOTE — Assessment & Plan Note (Addendum)
Overall this is improved after completion of clindamycin.  He does have some residual dyspnea that is mild and I am going to get a chest xray for further evaluation.  Previous Echo and BNP normal and I think PE is less likely given improvement.

## 2021-10-13 NOTE — Progress Notes (Signed)
Jimmy Mayo - 71 y.o. male MRN [id number]  Date of birth: [birth year]   This visit type was conducted due to national recommendations for restrictions regarding the COVID-19 Pandemic (e.g. social distancing).  This format is felt to be most appropriate for this patient at this time.  All issues noted in this document were discussed and addressed.  No physical exam was performed (except for noted visual exam findings with Video Visits).  I discussed the limitations of evaluation and management by telemedicine and the availability of in person appointments. The patient expressed understanding and agreed to proceed.  I connected withNAME@ on 10/13/21 at  1:10 PM EDT by a video enabled telemedicine application and verified that I am speaking with the correct person using two identifiers.  Present at visit: Jimmy Nutting, DO Jimmy Mayo   Patient Location: [place] [address]   Provider location:   [facility]  Chief Complaint  Patient presents with   Shortness of Breath    HPI  Jimmy Mayo is a 71 y.o. male who presents via audio/video conferencing for a telehealth visit today.  Following up today for dyspnea.  Reports completing course of clindamycin prescribed by his dentist for tooth pain and had improvement in his shortness of breath and cough.  He does still have some residual dyspnea.  He had noted some swelling around the left ankle but this appears resolved now as well. Denies fever, chills, nausea or vomiting, body aches or fatigue.     ROS:  A comprehensive ROS was completed and negative except as noted per HPI  Past Medical History:  Diagnosis Date   Anemia    Anxiety    Asthma    Back pain    Chronic kidney disease    Detached retina, bilateral    March 2020   GERD (gastroesophageal reflux disease)    Hypertension    Kidney stone    Retinal detachment 03/31/2018   Rt eye 03/29/18   Rupture long head biceps tendon 03/22/2017    Vitreous detachment of both eyes 03/31/2018   03/29/18    Past Surgical History:  Procedure Laterality Date   COLONOSCOPY W/ BIOPSIES AND POLYPECTOMY     was to have 1 this past year but planing to see new GI in [place] realize he may need onne every 5 years   HYDROCELE EXCISION / REPAIR     x3 or 2 on 1 and 1 on the other   [place] Bilateral    SPINAL FUSION     2020 and 2021   SPINE SURGERY  01/12/2004   l spine fusion/rods   VASECTOMY      Family History  Problem Relation Age of Onset   Cancer Mother        colon   Cancer Father        colon, prostate   Diabetes Father    Prostate cancer Father    Diabetes Maternal Grandmother     Social History   Socioeconomic History   Marital status: Divorced    Spouse name: Not on file   Number of children: 4   Years of education: 13   Highest education level: Some college, no degree  Occupational History   Occupation: retired  Tobacco Use   Smoking status: Every Day    Packs/day: 1.50  Years: 35.00    Total pack years: 52.50    Types: Cigarettes   Smokeless tobacco: Never   Tobacco comments:    Not ready to quit at this time   Vaping Use   Vaping Use: Former   Substances: Nicotine, Flavoring  Substance and Sexual Activity   Alcohol use: Not Currently    Comment: upsets stomach   Drug use: No   Sexual activity: Not on file  Other Topics Concern   Not on file  Social History Narrative   09/20/2020      Lives alone divorced with 4 children - hobbies stocks bonds    Social Determinants of Health   Financial Resource Strain: Not on file  Food Insecurity: No Food Insecurity (09/20/2020)   Hunger Vital Sign    Worried About Running Out of Food in the Last Year: Never true    Ran Out of Food in the Last Year: Never true  Transportation Needs: No Transportation Needs (09/20/2020)   PRAPARE - Radiographer, therapeutic (Medical): No    Lack of Transportation (Non-Medical): No  Physical Activity: Not on file  Stress: Not on file  Social Connections: Not on file  Intimate Partner Violence: Not At Risk (09/20/2020)   Humiliation, Afraid, Rape, and Kick questionnaire    Fear of Current or Ex-Partner: No    Emotionally Abused: No    Physically Abused: No    Sexually Abused: No     Current Outpatient Medications:    albuterol (VENTOLIN HFA) 108 (90 Base) MCG/ACT inhaler, INHALE 2 PUFFS INTO THE LUNGS EVERY 6 HOURS AS NEEDED, Disp: 25.5 g, Rfl: 1   allopurinol (ZYLOPRIM) 300 MG tablet, TAKE 1 TABLET(300 MG) BY MOUTH TWICE DAILY (Patient taking differently: 300 mg daily. TAKE 1 TABLET(300 MG) BY MOUTH DAILY), Disp: 180 tablet, Rfl: 3   ALPRAZolam (XANAX) 0.5 MG tablet, Take 1 tablet (0.5 mg total) by mouth 3 (three) times daily as needed., Disp: 90 tablet, Rfl: 1   Ascorbic Acid (VITAMIN C PO), Take by mouth., Disp: , Rfl:    diclofenac sodium (VOLTAREN) 1 % GEL, Apply 4 g topically 4 (four) times daily. To affected joint., Disp: 100 g, Rfl: 11   ECHINACEA PO, Take by mouth., Disp: , Rfl:    finasteride (PROSCAR) 5 MG tablet, TAKE 1 TABLET BY MOUTH DAILY, Disp: 90 tablet, Rfl: 0   Multiple Vitamin (MULTIVITAMIN) capsule, Take 1 capsule by mouth daily.  , Disp: , Rfl:    pantoprazole (PROTONIX) 40 MG tablet, TAKE 1 TABLET(40 MG) BY MOUTH TWICE DAILY, Disp: 90 tablet, Rfl: 3   rosuvastatin (CRESTOR) 20 MG tablet, Take 1 tablet (20 mg total) by mouth daily., Disp: 90 tablet, Rfl: 3   traMADol (ULTRAM) 50 MG tablet, TAKE 1 TABLET(50 MG) BY MOUTH EVERY 8 HOURS AS NEEDED, Disp: 90 tablet, Rfl: 1   valsartan-hydrochlorothiazide (DIOVAN-HCT) 160-12.5 MG tablet, Take 1 tablet by mouth daily., Disp: 90 tablet, Rfl: 3  EXAM:  VITALS per patient if applicable: Ht '5\' 9"'$  (1.753 m)   Wt 143 lb (64.9 kg)   BMI 21.12 kg/m   GENERAL: alert, oriented, appears well and in no acute distress  HEENT:  atraumatic, conjunttiva clear, no obvious abnormalities on inspection of external nose and ears  NECK: normal movements of the head and neck  LUNGS: on inspection no signs of respiratory distress, breathing rate appears normal, no obvious gross SOB, gasping or wheezing  CV: no obvious cyanosis  MS:  moves all visible extremities without noticeable abnormality  PSYCH/NEURO: pleasant and cooperative, no obvious depression or anxiety, speech and thought processing grossly intact  ASSESSMENT AND PLAN:  Discussed the following assessment and plan:  Dyspnea Overall this is improved after completion of clindamycin.  He does have some residual dyspnea that is mild and I am going to get a chest xray for further evaluation.  Previous Echo and BNP normal and I think PE is less likely given improvement.      I discussed the assessment and treatment plan with the patient. The patient was provided an opportunity to ask questions and all were answered. The patient agreed with the plan and demonstrated an understanding of the instructions.   The patient was advised to call back or seek an in-person evaluation if the symptoms worsen or if the condition fails to improve as anticipated.    Jimmy Nutting, DO

## 2021-10-14 ENCOUNTER — Ambulatory Visit (INDEPENDENT_AMBULATORY_CARE_PROVIDER_SITE_OTHER): Payer: Medicare Other

## 2021-10-14 DIAGNOSIS — R0600 Dyspnea, unspecified: Secondary | ICD-10-CM

## 2021-10-14 DIAGNOSIS — J439 Emphysema, unspecified: Secondary | ICD-10-CM | POA: Diagnosis not present

## 2021-10-21 DIAGNOSIS — Z191 Hormone sensitive malignancy status: Secondary | ICD-10-CM | POA: Diagnosis not present

## 2021-11-05 ENCOUNTER — Other Ambulatory Visit: Payer: Medicare Other

## 2021-11-15 ENCOUNTER — Other Ambulatory Visit: Payer: Self-pay | Admitting: Family Medicine

## 2021-11-16 ENCOUNTER — Other Ambulatory Visit: Payer: Medicare Other

## 2021-11-18 ENCOUNTER — Ambulatory Visit (INDEPENDENT_AMBULATORY_CARE_PROVIDER_SITE_OTHER): Payer: Medicare Other | Admitting: Family Medicine

## 2021-11-18 VITALS — Ht 69.0 in | Wt 139.0 lb

## 2021-11-18 DIAGNOSIS — Z23 Encounter for immunization: Secondary | ICD-10-CM | POA: Diagnosis not present

## 2021-11-18 DIAGNOSIS — R0600 Dyspnea, unspecified: Secondary | ICD-10-CM | POA: Diagnosis not present

## 2021-11-18 DIAGNOSIS — J449 Chronic obstructive pulmonary disease, unspecified: Secondary | ICD-10-CM

## 2021-11-18 MED ORDER — TRAMADOL HCL 50 MG PO TABS: ORAL_TABLET | ORAL | 1 refills | Status: DC

## 2021-11-18 MED ORDER — TRELEGY ELLIPTA 100-62.5-25 MCG/ACT IN AEPB: 1.0000 | INHALATION_SPRAY | Freq: Every day | RESPIRATORY_TRACT | 3 refills | Status: DC

## 2021-11-18 MED ORDER — ALBUTEROL SULFATE (2.5 MG/3ML) 0.083% IN NEBU
2.5000 mg | INHALATION_SOLUTION | Freq: Once | RESPIRATORY_TRACT | Status: AC
  Administered 2021-11-18: 2.5 mg via RESPIRATORY_TRACT

## 2021-11-18 MED ORDER — ALBUTEROL SULFATE HFA 108 (90 BASE) MCG/ACT IN AERS: 2.0000 | INHALATION_SPRAY | Freq: Four times a day (QID) | RESPIRATORY_TRACT | 1 refills | Status: DC | PRN

## 2021-11-18 MED ORDER — ALLOPURINOL 300 MG PO TABS: ORAL_TABLET | ORAL | 3 refills | Status: DC

## 2021-11-18 NOTE — Assessment & Plan Note (Signed)
Spirometry confirms COPD.  Continues to smoke with no intention of quitting at this time.  Counseling provided.  Adding Trelegy daily.  He may continue albuterol as needed.  Follow-up in about 4 to 6 weeks.

## 2021-11-18 NOTE — Progress Notes (Signed)
Jimmy Mayo - 71 y.o. male MRN [id number]  Date of birth: 06/18/1950  Subjective Chief Complaint  Patient presents with   spirometery testing     Nurse visit    HPI Jimmy Mayo has had continued dyspnea and is here today for spirometry.  Difficulty completing the test.  His spirometry does show significant obstructive pattern.  He does continue to smoke about a pack per day.  Reports that he smokes when stressed and does not feel like he can quit at this time.  He has been able to quit in the past.  He is not interested in medication to help with this right now.  He does have an albuterol inhaler and does get some relief from this.  His spirometry did not show any significant post bronchodilator improvement.  ROS:  A comprehensive ROS was completed and negative except as noted per HPI  Allergies  Allergen Reactions   Amoxicillin    Niacin And Related Other (See Comments)    Flushing    Penicillins    Pravastatin Sodium Other (See Comments)    REACTION: diarrhea   Zetia [Ezetimibe]     constipation    Past Medical History:  Diagnosis Date   Anemia    Anxiety    Asthma    Back pain    Chronic kidney disease    Detached retina, bilateral    March 2020   GERD (gastroesophageal reflux disease)    Hypertension    Kidney stone    Retinal detachment 03/31/2018   Rt eye 03/29/18   Rupture long head biceps tendon 03/22/2017   Vitreous detachment of both eyes 03/31/2018   03/29/18    Past Surgical History:  Procedure Laterality Date   COLONOSCOPY W/ BIOPSIES AND POLYPECTOMY     was to have 1 this past year but planing to see new GI in [place] realize he may need onne every 5 years   HYDROCELE EXCISION / REPAIR     x3 or 2 on 1 and 1 on the other   KNEE ARTHROSCOPY     1986   LITHOTRIPSY     [place] Bilateral    SPINAL FUSION     2020 and 2021   SPINE SURGERY  01/12/2004   l spine fusion/rods   VASECTOMY      Social History    Socioeconomic History   Marital status: Divorced    Spouse name: Not on file   Number of children: 4   Years of education: 13   Highest education level: Some college, no degree  Occupational History   Occupation: retired  Tobacco Use   Smoking status: Every Day    Packs/day: 1.50    Years: 35.00    Total pack years: 52.50    Types: Cigarettes   Smokeless tobacco: Never   Tobacco comments:    Not ready to quit at this time   Vaping Use   Vaping Use: Former   Substances: Nicotine, Flavoring  Substance and Sexual Activity   Alcohol use: Not Currently    Comment: upsets stomach   Drug use: No   Sexual activity: Not on file  Other Topics Concern   Not on file  Social History Narrative   09/20/2020      Lives alone divorced with 4 children - hobbies stocks bonds    Social Determinants of Health   Financial Resource Strain: Not on file  Food Insecurity: No Food  Insecurity (09/20/2020)   Hunger Vital Sign    Worried About Running Out of Food in the Last Year: Never true    Ran Out of Food in the Last Year: Never true  Transportation Needs: No Transportation Needs (09/20/2020)   PRAPARE - Hydrologist (Medical): No    Lack of Transportation (Non-Medical): No  Physical Activity: Not on file  Stress: Not on file  Social Connections: Not on file    Family History  Problem Relation Age of Onset   Cancer Mother        colon   Cancer Father        colon, prostate   Diabetes Father    Prostate cancer Father    Diabetes Maternal Grandmother     Health Maintenance  Topic Date Due   COVID-19 Vaccine (3 - Moderna risk series) 02/11/2022 (Originally 08/07/2019)   Medicare Annual Wellness (AWV)  02/11/2022 (Originally 09/20/2021)   Zoster Vaccines- Shingrix (1 of 2) 02/11/2022 (Originally 07/27/1970)   Lung Cancer Screening  01/20/2022   COLONOSCOPY (Pts 45-52yr Insurance coverage will need to be confirmed)  06/01/2022   TETANUS/TDAP  09/08/2027    Pneumonia Vaccine 70 Years old  Completed   INFLUENZA VACCINE  Completed   Hepatitis C Screening  Completed   HPV VACCINES  Aged Out     ----------------------------------------------------------------------------------------------------------------------------------------------------------------------------------------------------------------- Physical Exam Ht '5\' 9"'$  (1.753 m)   Wt 139 lb (63 kg)   BMI 20.53 kg/m   Physical Exam Constitutional:      Appearance: Normal appearance.  HENT:     Head: Normocephalic and atraumatic.  Cardiovascular:     Rate and Rhythm: Normal rate and regular rhythm.  Pulmonary:     Effort: Pulmonary effort is normal.     Breath sounds: Normal breath sounds.  Neurological:     Mental Status: He is alert.  Psychiatric:        Mood and Affect: Mood normal.        Behavior: Behavior normal.     ------------------------------------------------------------------------------------------------------------------------------------------------------------------------------------------------------------------- Assessment and Plan  COPD (chronic obstructive pulmonary disease) (HCC) Spirometry confirms COPD.  Continues to smoke with no intention of quitting at this time.  Counseling provided.  Adding Trelegy daily.  He may continue albuterol as needed.  Follow-up in about 4 to 6 weeks.   Meds ordered this encounter  Medications   albuterol (PROVENTIL) (2.5 MG/3ML) 0.083% nebulizer solution 2.5 mg   Fluticasone-Umeclidin-Vilant (TRELEGY ELLIPTA) 100-62.5-25 MCG/ACT AEPB    Sig: Inhale 1 puff into the lungs daily.    Dispense:  3 each    Refill:  3   allopurinol (ZYLOPRIM) 300 MG tablet    Sig: TAKE 1 TABLET(300 MG) BY MOUTH TWICE DAILY    Dispense:  180 tablet    Refill:  3   traMADol (ULTRAM) 50 MG tablet    Sig: TAKE 1 TABLET(50 MG) BY MOUTH EVERY 8 HOURS AS NEEDED    Dispense:  90 tablet    Refill:  1   albuterol (VENTOLIN HFA) 108 (90 Base)  MCG/ACT inhaler    Sig: Inhale 2 puffs into the lungs every 6 (six) hours as needed.    Dispense:  25.5 g    Refill:  1    Return in about 6 weeks (around 12/30/2021) for F/u  COPD.    This visit occurred during the SARS-CoV-2 public health emergency.  Safety protocols were in place, including screening questions prior to the visit, additional usage of staff  PPE, and extensive cleaning of exam room while observing appropriate contact time as indicated for disinfecting solutions.

## 2021-11-18 NOTE — Progress Notes (Signed)
Spirometery testing done on patient for dyspnea. Albuterol sulfate 2.'5mg'$ /17m administered via nebulizer between pre and post testing . Testing results given to provider for review.

## 2021-11-18 NOTE — Patient Instructions (Addendum)
Start trelegy daily Continue albuterol as needed.  I recommend that you have RSV vaccine-You may get this at your pharmacy.

## 2021-12-22 ENCOUNTER — Other Ambulatory Visit: Payer: Self-pay | Admitting: Family Medicine

## 2021-12-22 DIAGNOSIS — K269 Duodenal ulcer, unspecified as acute or chronic, without hemorrhage or perforation: Secondary | ICD-10-CM

## 2021-12-31 ENCOUNTER — Encounter: Payer: Self-pay | Admitting: Family Medicine

## 2021-12-31 ENCOUNTER — Ambulatory Visit (INDEPENDENT_AMBULATORY_CARE_PROVIDER_SITE_OTHER): Payer: Medicare Other | Admitting: Family Medicine

## 2021-12-31 VITALS — BP 134/78 | HR 84 | Ht 69.0 in | Wt 144.0 lb

## 2021-12-31 DIAGNOSIS — M79672 Pain in left foot: Secondary | ICD-10-CM

## 2021-12-31 DIAGNOSIS — J449 Chronic obstructive pulmonary disease, unspecified: Secondary | ICD-10-CM

## 2021-12-31 DIAGNOSIS — M79671 Pain in right foot: Secondary | ICD-10-CM

## 2021-12-31 NOTE — Progress Notes (Signed)
Jimmy Mayo - 71 y.o. male MRN [id number]  Date of birth: 01-02-1951  Subjective Chief Complaint  Patient presents with   foot pain   Immunizations    HPI Jimmy Mayo is a 71 year old male here today for follow-up visit.  Spirometry completed previously showing COPD.  Trelegy added and he reports that dyspnea has improved quite a bit since last visit.  He has not noted any side effects with Trelegy.  He does unfortunately continue to smoke.  Has had limited success with cutting back or quitting previously.  He is having pain in his foot.  Reports having heel pain as well as some forefoot pain with callused spots over the area.  Denies ulceration.  ROS:  A comprehensive ROS was completed and negative except as noted per HPI  Allergies  Allergen Reactions   Amoxicillin    Niacin And Related Other (See Comments)    Flushing    Penicillins    Pravastatin Sodium Other (See Comments)    REACTION: diarrhea   Zetia [Ezetimibe]     constipation    Past Medical History:  Diagnosis Date   Anemia    Anxiety    Asthma    Back pain    Chronic kidney disease    Detached retina, bilateral    March 2020   GERD (gastroesophageal reflux disease)    Hypertension    Kidney stone    Retinal detachment 03/31/2018   Rt eye 03/29/18   Rupture long head biceps tendon 03/22/2017   Vitreous detachment of both eyes 03/31/2018   03/29/18    Past Surgical History:  Procedure Laterality Date   COLONOSCOPY W/ BIOPSIES AND POLYPECTOMY     was to have 1 this past year but planing to see new GI in [place] realize he may need onne every 5 years   HYDROCELE EXCISION / REPAIR     x3 or 2 on 1 and 1 on the other   KNEE ARTHROSCOPY     1986   LITHOTRIPSY     [place] Bilateral    SPINAL FUSION     2020 and 2021   SPINE SURGERY  01/12/2004   l spine fusion/rods   VASECTOMY      Social History   Socioeconomic History   Marital status: Divorced     Spouse name: Not on file   Number of children: 4   Years of education: 13   Highest education level: Some college, no degree  Occupational History   Occupation: retired  Tobacco Use   Smoking status: Every Day    Packs/day: 1.50    Years: 35.00    Total pack years: 52.50    Types: Cigarettes   Smokeless tobacco: Never   Tobacco comments:    Not ready to quit at this time   Vaping Use   Vaping Use: Former   Substances: Nicotine, Flavoring  Substance and Sexual Activity   Alcohol use: Not Currently    Comment: upsets stomach   Drug use: No   Sexual activity: Not on file  Other Topics Concern   Not on file  Social History Narrative   09/20/2020      Lives alone divorced with 4 children - hobbies stocks bonds    Social Determinants of Health   Financial Resource Strain: Not on file  Food Insecurity: No Food Insecurity (09/20/2020)   Hunger Vital Sign    Worried About Running Out of Food  in the Last Year: Never true    [place] in the Last Year: Never true  Transportation Needs: No Transportation Needs (09/20/2020)   PRAPARE - Hydrologist (Medical): No    Lack of Transportation (Non-Medical): No  Physical Activity: Not on file  Stress: Not on file  Social Connections: Not on file    Family History  Problem Relation Age of Onset   Cancer Mother        colon   Cancer Father        colon, prostate   Diabetes Father    Prostate cancer Father    Diabetes Maternal Grandmother     Health Maintenance  Topic Date Due   Lung Cancer Screening  01/20/2022   COVID-19 Vaccine (3 - Moderna risk series) 02/11/2022 (Originally 08/07/2019)   Medicare Annual Wellness (AWV)  02/11/2022 (Originally 09/20/2021)   Zoster Vaccines- Shingrix (1 of 2) 02/11/2022 (Originally 07/27/1970)   COLONOSCOPY (Pts 45-63yr Insurance coverage will need to be confirmed)  06/01/2022   DTaP/Tdap/Td (3 - Td or Tdap) 09/08/2027   Pneumonia Vaccine 70 Years old   Completed   INFLUENZA VACCINE  Completed   Hepatitis C Screening  Completed   HPV VACCINES  Aged Out     ----------------------------------------------------------------------------------------------------------------------------------------------------------------------------------------------------------------- Physical Exam BP 134/78 (BP Location: Left Arm, Patient Position: Sitting, Cuff Size: Normal)   Pulse 84   Ht '5\' 9"'$  (1.753 m)   Wt 144 lb (65.3 kg)   SpO2 96%   BMI 21.27 kg/m   Physical Exam Constitutional:      Appearance: Normal appearance.  HENT:     Head: Normocephalic and atraumatic.  Eyes:     General: No scleral icterus. Cardiovascular:     Rate and Rhythm: Normal rate and regular rhythm.  Pulmonary:     Effort: Pulmonary effort is normal.     Breath sounds: Normal breath sounds.  Neurological:     Mental Status: He is alert.  Psychiatric:        Mood and Affect: Mood normal.        Behavior: Behavior normal.     ------------------------------------------------------------------------------------------------------------------------------------------------------------------------------------------------------------------- Assessment and Plan  COPD (chronic obstructive pulmonary disease) (HMetaline He is doing well with Trelegy.  Counseled on smoking cessation as we discussed that this is the most important step to prevent his COPD from progressing further.  Pain in both feet Has ha has some callused areas over the forefoot, likely experiencing some metatarsalgia over these areas.  Additionally, he has callus area over the heel that is tender.  Referral placed to podiatry.   No orders of the defined types were placed in this encounter.   No follow-ups on file.    This visit occurred during the SARS-CoV-2 public health emergency.  Safety protocols were in place, including screening questions prior to the visit, additional usage of staff PPE, and  extensive cleaning of exam room while observing appropriate contact time as indicated for disinfecting solutions.

## 2021-12-31 NOTE — Assessment & Plan Note (Signed)
He is doing well with Trelegy.  Counseled on smoking cessation as we discussed that this is the most important step to prevent his COPD from progressing further.

## 2021-12-31 NOTE — Assessment & Plan Note (Signed)
Has ha has some callused areas over the forefoot, likely experiencing some metatarsalgia over these areas.  Additionally, he has callus area over the heel that is tender.  Referral placed to podiatry.

## 2022-01-08 ENCOUNTER — Other Ambulatory Visit: Payer: Self-pay

## 2022-01-08 MED ORDER — ROSUVASTATIN CALCIUM 20 MG PO TABS: 20.0000 mg | ORAL_TABLET | Freq: Every day | ORAL | 3 refills | Status: DC

## 2022-01-10 ENCOUNTER — Other Ambulatory Visit: Payer: Self-pay | Admitting: Family Medicine

## 2022-01-18 ENCOUNTER — Other Ambulatory Visit: Payer: Self-pay | Admitting: Family Medicine

## 2022-01-26 ENCOUNTER — Other Ambulatory Visit: Payer: Self-pay

## 2022-01-26 MED ORDER — TRAMADOL HCL 50 MG PO TABS: ORAL_TABLET | ORAL | 1 refills | Status: DC

## 2022-01-28 ENCOUNTER — Encounter: Payer: Self-pay | Admitting: Podiatry

## 2022-01-28 ENCOUNTER — Ambulatory Visit (INDEPENDENT_AMBULATORY_CARE_PROVIDER_SITE_OTHER): Payer: Medicare Other

## 2022-01-28 ENCOUNTER — Ambulatory Visit: Payer: Medicare Other | Admitting: Podiatry

## 2022-01-28 DIAGNOSIS — M19072 Primary osteoarthritis, left ankle and foot: Secondary | ICD-10-CM | POA: Diagnosis not present

## 2022-01-28 DIAGNOSIS — Q6671 Congenital pes cavus, right foot: Secondary | ICD-10-CM

## 2022-01-28 DIAGNOSIS — M778 Other enthesopathies, not elsewhere classified: Secondary | ICD-10-CM

## 2022-01-28 DIAGNOSIS — M19071 Primary osteoarthritis, right ankle and foot: Secondary | ICD-10-CM | POA: Diagnosis not present

## 2022-01-28 DIAGNOSIS — Q828 Other specified congenital malformations of skin: Secondary | ICD-10-CM | POA: Diagnosis not present

## 2022-01-28 DIAGNOSIS — Q6672 Congenital pes cavus, left foot: Secondary | ICD-10-CM | POA: Diagnosis not present

## 2022-01-28 DIAGNOSIS — M722 Plantar fascial fibromatosis: Secondary | ICD-10-CM | POA: Diagnosis not present

## 2022-01-28 NOTE — Patient Instructions (Signed)
Inserts/Orthotics Options for inserts for a high arch foot include custom orthotics ($495 a pair) that we can fit for in the office vs over the counter options. My recommendation for OTC options would include Superfeet from the store [employer]. I would recommend going with the version that is made for high arch or over supination feet. They typically run around $50-60.     Plantar Fasciitis (Heel Spur Syndrome) with Rehab The plantar fascia is a fibrous, ligament-like, soft-tissue structure that spans the bottom of the foot. Plantar fasciitis is a condition that causes pain in the foot due to inflammation of the tissue. SYMPTOMS  Pain and tenderness on the underneath side of the foot. Pain that worsens with standing or walking. CAUSES  Plantar fasciitis is caused by irritation and injury to the plantar fascia on the underneath side of the foot. Common mechanisms of injury include: Direct trauma to bottom of the foot. Damage to a small nerve that runs under the foot where the main fascia attaches to the heel bone. Stress placed on the plantar fascia due to bone spurs. RISK INCREASES WITH:  Activities that place stress on the plantar fascia (running, jumping, pivoting, or cutting). Poor strength and flexibility. Improperly fitted shoes. Tight calf muscles. Flat feet. Failure to warm-up properly before activity. Obesity. PREVENTION Warm up and stretch properly before activity. Allow for adequate recovery between workouts. Maintain physical fitness: Strength, flexibility, and endurance. Cardiovascular fitness. Maintain a health body weight. Avoid stress on the plantar fascia. Wear properly fitted shoes, including arch supports for individuals who have flat feet.  PROGNOSIS  If treated properly, then the symptoms of plantar fasciitis usually resolve without surgery. However, occasionally surgery is necessary.  RELATED COMPLICATIONS  Recurrent symptoms that may result in a chronic  condition. Problems of the lower back that are caused by compensating for the injury, such as limping. Pain or weakness of the foot during push-off following surgery. Chronic inflammation, scarring, and partial or complete fascia tear, occurring more often from repeated injections.  TREATMENT  Treatment initially involves the use of ice and medication to help reduce pain and inflammation. The use of strengthening and stretching exercises may help reduce pain with activity, especially stretches of the Achilles tendon. These exercises may be performed at home or with a therapist. Your caregiver may recommend that you use heel cups of arch supports to help reduce stress on the plantar fascia. Occasionally, corticosteroid injections are given to reduce inflammation. If symptoms persist for greater than 6 months despite non-surgical (conservative), then surgery may be recommended.   MEDICATION  If pain medication is necessary, then nonsteroidal anti-inflammatory medications, such as aspirin and ibuprofen, or other minor pain relievers, such as acetaminophen, are often recommended. Do not take pain medication within 7 days before surgery. Prescription pain relievers may be given if deemed necessary by your caregiver. Use only as directed and only as much as you need. Corticosteroid injections may be given by your caregiver. These injections should be reserved for the most serious cases, because they may only be given a certain number of times.  HEAT AND COLD Cold treatment (icing) relieves pain and reduces inflammation. Cold treatment should be applied for 10 to 15 minutes every 2 to 3 hours for inflammation and pain and immediately after any activity that aggravates your symptoms. Use ice packs or massage the area with a piece of ice (ice massage). Heat treatment may be used prior to performing the stretching and strengthening activities prescribed by your  caregiver, physical therapist, or athletic  trainer. Use a heat pack or soak the injury in warm water.  SEEK IMMEDIATE MEDICAL CARE IF: Treatment seems to offer no benefit, or the condition worsens. Any medications produce adverse side effects.  EXERCISES- RANGE OF MOTION (ROM) AND STRETCHING EXERCISES - Plantar Fasciitis (Heel Spur Syndrome) These exercises may help you when beginning to rehabilitate your injury. Your symptoms may resolve with or without further involvement from your physician, physical therapist or athletic trainer. While completing these exercises, remember:  Restoring tissue flexibility helps normal motion to return to the joints. This allows healthier, less painful movement and activity. An effective stretch should be held for at least 30 seconds. A stretch should never be painful. You should only feel a gentle lengthening or release in the stretched tissue.  RANGE OF MOTION - Toe Extension, Flexion Sit with your right / left leg crossed over your opposite knee. Grasp your toes and gently pull them back toward the top of your foot. You should feel a stretch on the bottom of your toes and/or foot. Hold this stretch for 10 seconds. Now, gently pull your toes toward the bottom of your foot. You should feel a stretch on the top of your toes and or foot. Hold this stretch for 10 seconds. Repeat  times. Complete this stretch 3 times per day.   RANGE OF MOTION - Ankle Dorsiflexion, Active Assisted Remove shoes and sit on a chair that is preferably not on a carpeted surface. Place right / left foot under knee. Extend your opposite leg for support. Keeping your heel down, slide your right / left foot back toward the chair until you feel a stretch at your ankle or calf. If you do not feel a stretch, slide your bottom forward to the edge of the chair, while still keeping your heel down. Hold this stretch for 10 seconds. Repeat 3 times. Complete this stretch 2 times per day.   STRETCH  Gastroc, Standing Place hands on  wall. Extend right / left leg, keeping the front knee somewhat bent. Slightly point your toes inward on your back foot. Keeping your right / left heel on the floor and your knee straight, shift your weight toward the wall, not allowing your back to arch. You should feel a gentle stretch in the right / left calf. Hold this position for 10 seconds. Repeat 3 times. Complete this stretch 2 times per day.  STRETCH  Soleus, Standing Place hands on wall. Extend right / left leg, keeping the other knee somewhat bent. Slightly point your toes inward on your back foot. Keep your right / left heel on the floor, bend your back knee, and slightly shift your weight over the back leg so that you feel a gentle stretch deep in your back calf. Hold this position for 10 seconds. Repeat 3 times. Complete this stretch 2 times per day.  STRETCH  Gastrocsoleus, Standing  Note: This exercise can place a lot of stress on your foot and ankle. Please complete this exercise only if specifically instructed by your caregiver.  Place the ball of your right / left foot on a step, keeping your other foot firmly on the same step. Hold on to the wall or a rail for balance. Slowly lift your other foot, allowing your body weight to press your heel down over the edge of the step. You should feel a stretch in your right / left calf. Hold this position for 10 seconds. Repeat this exercise with  a slight bend in your right / left knee. Repeat 3 times. Complete this stretch 2 times per day.   STRENGTHENING EXERCISES - Plantar Fasciitis (Heel Spur Syndrome)  These exercises may help you when beginning to rehabilitate your injury. They may resolve your symptoms with or without further involvement from your physician, physical therapist or athletic trainer. While completing these exercises, remember:  Muscles can gain both the endurance and the strength needed for everyday activities through controlled exercises. Complete these  exercises as instructed by your physician, physical therapist or athletic trainer. Progress the resistance and repetitions only as guided.  STRENGTH - Towel Curls Sit in a chair positioned on a non-carpeted surface. Place your foot on a towel, keeping your heel on the floor. Pull the towel toward your heel by only curling your toes. Keep your heel on the floor. Repeat 3 times. Complete this exercise 2 times per day.  STRENGTH - Ankle Inversion Secure one end of a rubber exercise band/tubing to a fixed object (table, pole). Loop the other end around your foot just before your toes. Place your fists between your knees. This will focus your strengthening at your ankle. Slowly, pull your big toe up and in, making sure the band/tubing is positioned to resist the entire motion. Hold this position for 10 seconds. Have your muscles resist the band/tubing as it slowly pulls your foot back to the starting position. Repeat 3 times. Complete this exercises 2 times per day.  Document Released: 12/28/2004 Document Revised: 03/22/2011 Document Reviewed: 04/11/2008 [facility] Patient Information 2014 [place], [place].

## 2022-01-28 NOTE — Progress Notes (Signed)
  Subjective:  Patient ID: Jimmy Mayo, male    DOB: 03/18/1950,   MRN: [id number]  Chief Complaint  Patient presents with   Foot Pain    Pain in both feet    72 y.o. male presents for concern of pain in both of his feet. States left is worse than right. Relates he has always dealt with calloses on his feet and have recently worsened and wondering why and what can be done. Also relates his heel has been painful on the left. Denies any current treatments for this. It started several months ago. Has tried getting new shoes to see if this helps. Typically hurts after long periods of being on them or first steps in the morning.  . Denies any other pedal complaints. Denies n/v/f/c.   Past Medical History:  Diagnosis Date   Anemia    Anxiety    Asthma    Back pain    Chronic kidney disease    Detached retina, bilateral    March 2020   GERD (gastroesophageal reflux disease)    Hypertension    Kidney stone    Retinal detachment 03/31/2018   Rt eye 03/29/18   Rupture long head biceps tendon 03/22/2017   Vitreous detachment of both eyes 03/31/2018   03/29/18    Objective:  Physical Exam: Vascular: DP/PT pulses 2/4 bilateral. CFT <3 seconds. Normal hair growth on digits. No edema.  Skin. No lacerations or abrasions bilateral feet.  Musculoskeletal: MMT 5/5 bilateral lower extremities in DF, PF, Inversion and Eversion. Deceased ROM in DF of ankle joint.  Pes cavus noted bilateral. Tender to medial calcaneal tubercle on the left. No pain along achille PT or arch.  Neurological: Sensation intact to light touch.   Assessment:   1. Plantar fasciitis, left   2. Congenital bilateral pes cavus   3. Porokeratosis      Plan:  Patient was evaluated and treated and all questions answered. X-rays reviewed and discussed with patient. No acute fractures or dislocations noted.  Pes cavus noted bilateral with midfoot degenerative changes noted as well bilateral.  Bipartite sesamoid noted bilateral.   Discussed plantar fasciitis with patient.  X-rays reviewed and discussed with patient. No acute fractures or dislocations noted. Mild spurring noted at inferior calcaneus.  Discussed treatment options including, ice, NSAIDS, supportive shoes, bracing, and stretching. Stretching exercises provided to be done on a daily basis.   Continue with tylenol as meloxicam causes stomach upset. . Pf brace dispensed.  Discussed custom inserts vs OTC, Patient will look into.  Hyperkeratosis debrided today as courtesy without incident.  Follow-up 6 weeks or sooner if any problems arise. In the meantime, encouraged to call the office with any questions, concerns, change in symptoms.     Lorenda Peck, DPM

## 2022-03-11 ENCOUNTER — Ambulatory Visit: Payer: Medicare Other | Admitting: Podiatry

## 2022-03-17 ENCOUNTER — Other Ambulatory Visit: Payer: Self-pay

## 2022-03-17 DIAGNOSIS — I10 Essential (primary) hypertension: Secondary | ICD-10-CM

## 2022-03-17 MED ORDER — VALSARTAN-HYDROCHLOROTHIAZIDE 160-12.5 MG PO TABS: 1.0000 | ORAL_TABLET | Freq: Every day | ORAL | 1 refills | Status: DC

## 2022-03-25 ENCOUNTER — Encounter: Payer: Self-pay | Admitting: Family Medicine

## 2022-03-25 ENCOUNTER — Ambulatory Visit (INDEPENDENT_AMBULATORY_CARE_PROVIDER_SITE_OTHER): Payer: Medicare Other | Admitting: Family Medicine

## 2022-03-25 VITALS — BP 115/71 | HR 78 | Ht 69.0 in | Wt 144.0 lb

## 2022-03-25 DIAGNOSIS — J449 Chronic obstructive pulmonary disease, unspecified: Secondary | ICD-10-CM | POA: Diagnosis not present

## 2022-03-25 DIAGNOSIS — B370 Candidal stomatitis: Secondary | ICD-10-CM | POA: Insufficient documentation

## 2022-03-25 MED ORDER — BREZTRI AEROSPHERE 160-9-4.8 MCG/ACT IN AERO: 2.0000 | INHALATION_SPRAY | Freq: Two times a day (BID) | RESPIRATORY_TRACT | 11 refills | Status: DC

## 2022-03-25 MED ORDER — NYSTATIN 100000 UNIT/ML MT SUSP: 5.0000 mL | Freq: Four times a day (QID) | OROMUCOSAL | 0 refills | Status: AC

## 2022-03-25 NOTE — Patient Instructions (Addendum)
Start nystatin for thrush.  Let's stop trelegy.  Start breztri after thrush has cleared up. Be sure to rinse after use.

## 2022-03-25 NOTE — Assessment & Plan Note (Signed)
Adding nystatin suspension.  He will let me know if symptoms persist after completion of this.

## 2022-03-25 NOTE — Progress Notes (Signed)
Jimmy Mayo - 72 y.o. male MRN [id number]  Date of birth: July 21, 1950  Subjective Chief Complaint  Patient presents with   Medication Reaction    HPI Jimmy Mayo is a 72 y.o. male here today for with concerns about possible side effects from trelegy.  He has noted shaky feeling, slow urination, palpitations, and burning sensation in his mouth.  He was not rinsing his mouth for the first couple months after starting this.  He does feel that he is fully inhaling powder correctly.  He does have a coating on his tongue.  Respiratory symptoms have improved some since adding this.  Unfortunately, he does continue to smoke.  ROS:  A comprehensive ROS was completed and negative except as noted per HPI  Allergies  Allergen Reactions   Amoxicillin    Niacin And Related Other (See Comments)    Flushing    Penicillins    Pravastatin Sodium Other (See Comments)    REACTION: diarrhea   Zetia [Ezetimibe]     constipation    Past Medical History:  Diagnosis Date   Anemia    Anxiety    Asthma    Back pain    Chronic kidney disease    Detached retina, bilateral    March 2020   GERD (gastroesophageal reflux disease)    Hypertension    Kidney stone    Retinal detachment 03/31/2018   Rt eye 03/29/18   Rupture long head biceps tendon 03/22/2017   Vitreous detachment of both eyes 03/31/2018   03/29/18    Past Surgical History:  Procedure Laterality Date   COLONOSCOPY W/ BIOPSIES AND POLYPECTOMY     was to have 1 this past year but planing to see new GI in [place] realize he may need onne every 5 years   HYDROCELE EXCISION / REPAIR     x3 or 2 on 1 and 1 on the other   KNEE ARTHROSCOPY     1986   LITHOTRIPSY     [place] Bilateral    SPINAL FUSION     2020 and 2021   SPINE SURGERY  01/12/2004   l spine fusion/rods   VASECTOMY      Social History   Socioeconomic History   Marital status: Divorced    Spouse name: Not on file    Number of children: 4   Years of education: 13   Highest education level: Some college, no degree  Occupational History   Occupation: retired  Tobacco Use   Smoking status: Every Day    Packs/day: 1.50    Years: 35.00    Additional pack years: 0.00    Total pack years: 52.50    Types: Cigarettes   Smokeless tobacco: Never   Tobacco comments:    Not ready to quit at this time   Vaping Use   Vaping Use: Former   Substances: Nicotine, Flavoring  Substance and Sexual Activity   Alcohol use: Not Currently    Comment: upsets stomach   Drug use: No   Sexual activity: Not on file  Other Topics Concern   Not on file  Social History Narrative   09/20/2020      Lives alone divorced with 4 children - hobbies stocks bonds    Social Determinants of Health   Financial Resource Strain: Not on file  Food Insecurity: No Food Insecurity (09/20/2020)   Hunger Vital Sign    Worried About Running Out of  Food in the Last Year: Never true    [employer] in the Last Year: Never true  Transportation Needs: No Transportation Needs (09/20/2020)   PRAPARE - Hydrologist (Medical): No    Lack of Transportation (Non-Medical): No  Physical Activity: Not on file  Stress: Not on file  Social Connections: Not on file    Family History  Problem Relation Age of Onset   Cancer Mother        colon   Cancer Father        colon, prostate   Diabetes Father    Prostate cancer Father    Diabetes Maternal Grandmother     Health Maintenance  Topic Date Due   Lung Cancer Screening  01/20/2022   COVID-19 Vaccine (3 - Moderna risk series) 04/10/2023 (Originally 08/07/2019)   Medicare Annual Wellness (AWV)  04/25/2023 (Originally 09/20/2021)   Zoster Vaccines- Shingrix (1 of 2) 06/25/2023 (Originally 07/27/1970)   COLONOSCOPY (Pts 45-56yr Insurance coverage will need to be confirmed)  06/01/2022   DTaP/Tdap/Td (3 - Td or Tdap) 09/08/2027   Pneumonia Vaccine 72 Years old   Completed   INFLUENZA VACCINE  Completed   Hepatitis C Screening  Completed   HPV VACCINES  Aged Out     ----------------------------------------------------------------------------------------------------------------------------------------------------------------------------------------------------------------- Physical Exam BP 115/71 (BP Location: Left Arm, Patient Position: Sitting, Cuff Size: Normal)   Pulse 78   Ht '5\' 9"'$  (1.753 m)   Wt 144 lb (65.3 kg)   SpO2 97%   BMI 21.27 kg/m   Physical Exam Constitutional:      Appearance: Normal appearance.  HENT:     Head: Normocephalic and atraumatic.  Eyes:     General: No scleral icterus. Cardiovascular:     Rate and Rhythm: Normal rate and regular rhythm.  Pulmonary:     Effort: Pulmonary effort is normal.     Breath sounds: Normal breath sounds.  Musculoskeletal:     Cervical back: Neck supple.  Neurological:     General: No focal deficit present.     Mental Status: He is alert.  Psychiatric:        Mood and Affect: Mood normal.        Behavior: Behavior normal.     ------------------------------------------------------------------------------------------------------------------------------------------------------------------------------------------------------------------- Assessment and Plan  COPD (chronic obstructive pulmonary disease) (HWidener He did not tolerate Trelegy well.  Incomplete inhalation of powdered inhaler as well as not rinsing afterwards may have contributed to increased systemic effects.  [facility] this to see if this is better tolerated.  He will let me know if this is not effective or if having similar side effects.  Thrush Adding nystatin suspension.  He will let me know if symptoms persist after completion of this.   Meds ordered this encounter  Medications   Budeson-Glycopyrrol-Formoterol (BREZTRI AEROSPHERE) 160-9-4.8 MCG/ACT AERO    Sig:  Inhale 2 puffs into the lungs 2 (two) times daily.    Dispense:  10.7 g    Refill:  11   nystatin (MYCOSTATIN) 100000 UNIT/ML suspension    Sig: Take 5 mLs (500,000 Units total) by mouth 4 (four) times daily for 7 days.    Dispense:  200 mL    Refill:  0    No follow-ups on file.    This visit occurred during the SARS-CoV-2 public health emergency.  Safety protocols were in place, including screening questions prior to the visit, additional usage of staff  PPE, and extensive cleaning of exam room while observing appropriate contact time as indicated for disinfecting solutions.

## 2022-03-25 NOTE — Assessment & Plan Note (Signed)
He did not tolerate Trelegy well.  Incomplete inhalation of powdered inhaler as well as not rinsing afterwards may have contributed to increased systemic effects.  We discussed switching to Jimmy Mayo and he is open to trying this to see if this is better tolerated.  He will let me know if this is not effective or if having similar side effects.

## 2022-04-01 ENCOUNTER — Other Ambulatory Visit: Payer: Self-pay | Admitting: Family Medicine

## 2022-04-09 ENCOUNTER — Other Ambulatory Visit: Payer: Self-pay | Admitting: Family Medicine

## 2022-04-13 ENCOUNTER — Other Ambulatory Visit: Payer: Self-pay | Admitting: Family Medicine

## 2022-04-14 ENCOUNTER — Other Ambulatory Visit: Payer: Self-pay | Admitting: Family Medicine

## 2022-05-13 ENCOUNTER — Telehealth: Payer: Self-pay | Admitting: Family Medicine

## 2022-05-13 NOTE — Telephone Encounter (Signed)
Called patient to schedule Medicare Annual Wellness Visit (AWV). Left message for patient to call back and schedule Medicare Annual Wellness Visit (AWV).  Last date of AWV: 2022  Please schedule an appointment at any time with [facility].  If any questions, please contact me at [phone].  Thank you ,  Morgan Jessup Patient Access Advocate II Direct Dial: [phone]   

## 2022-05-28 ENCOUNTER — Other Ambulatory Visit: Payer: Self-pay | Admitting: Family Medicine

## 2022-06-18 ENCOUNTER — Other Ambulatory Visit: Payer: Self-pay | Admitting: Family Medicine

## 2022-06-18 DIAGNOSIS — K269 Duodenal ulcer, unspecified as acute or chronic, without hemorrhage or perforation: Secondary | ICD-10-CM

## 2022-07-01 IMAGING — CT CT CHEST LUNG CANCER SCREENING LOW DOSE W/O CM
2 of 4 series · 15 of 36 positions shown, 18 images · non-contrast
Comparison: 10/18/2013 chest radiograph. Report of a diagnostic CT
of 02/13/2002.

CLINICAL DATA: Fifty-three pack-year smoking history. Current
smoker.



[Series 3: lungs · axial · 0.66mm/px · z∈[-338,-58]mm · 12 of 310 slices shown, 15 images]
[im 15/310  mediastinal]
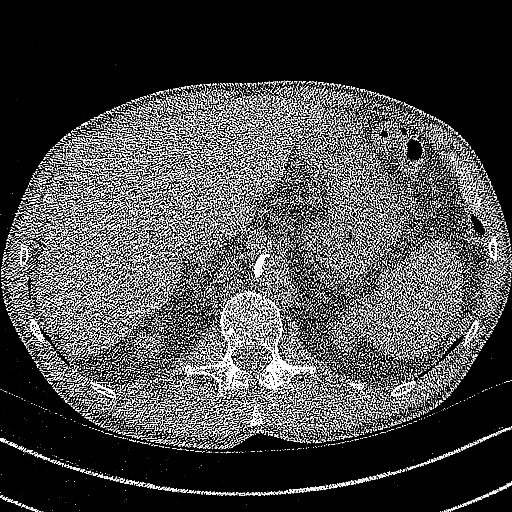
[im 15/310  lung]
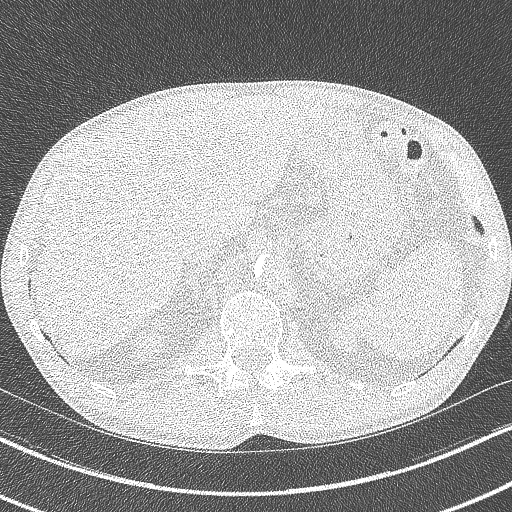
[im 43/310  lung]
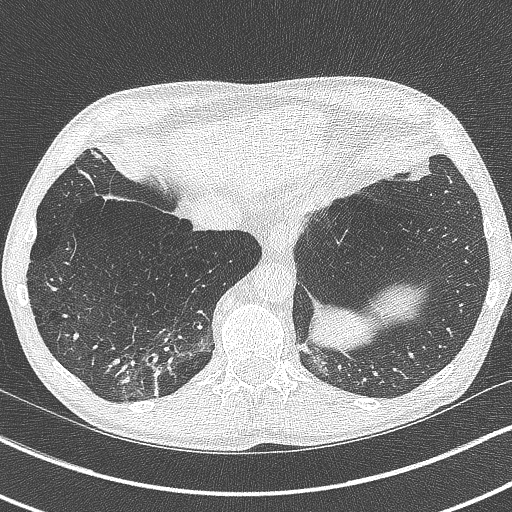
[im 71/310  lung]
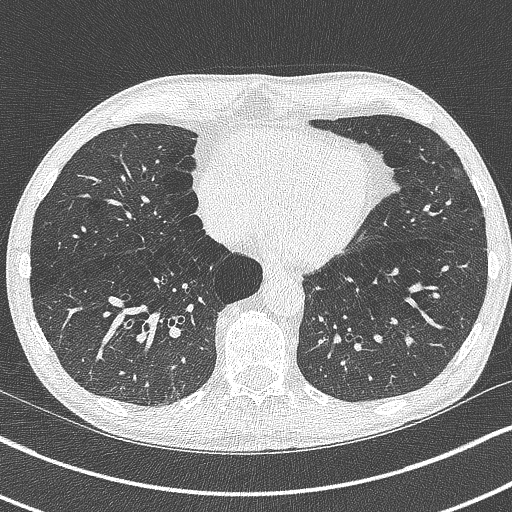
[im 99/310  lung]
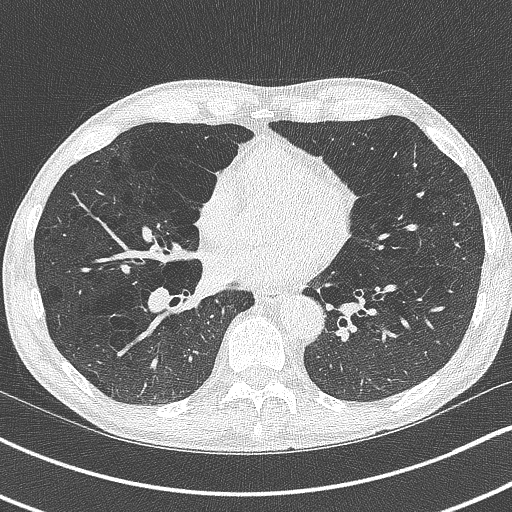
[im 113/310  mediastinal]
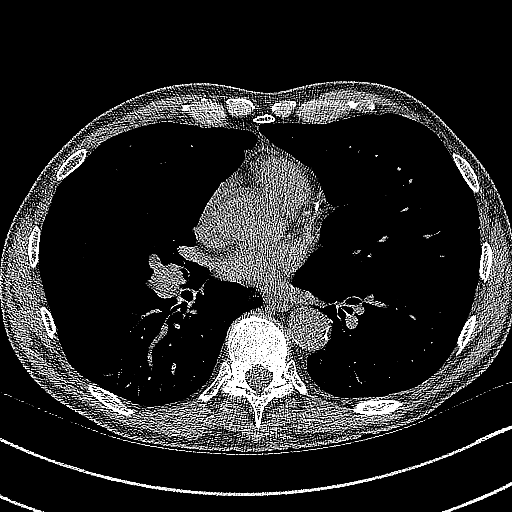
[im 113/310  lung]
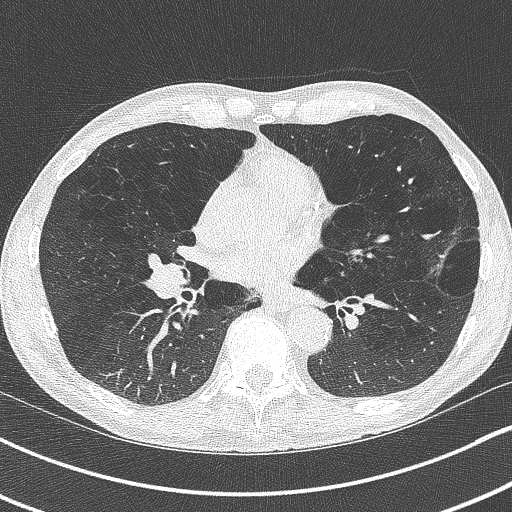
[im 141/310  lung]
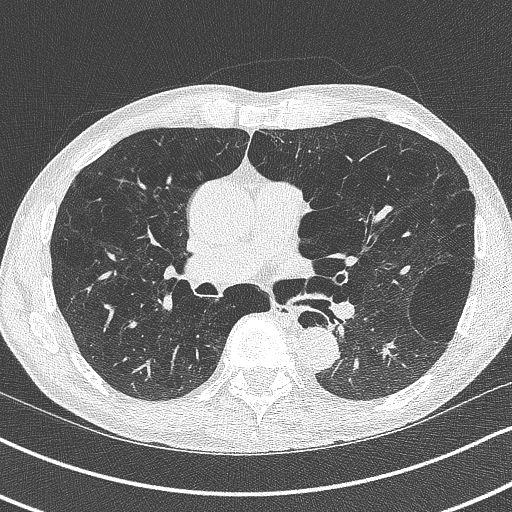
[im 169/310  lung]
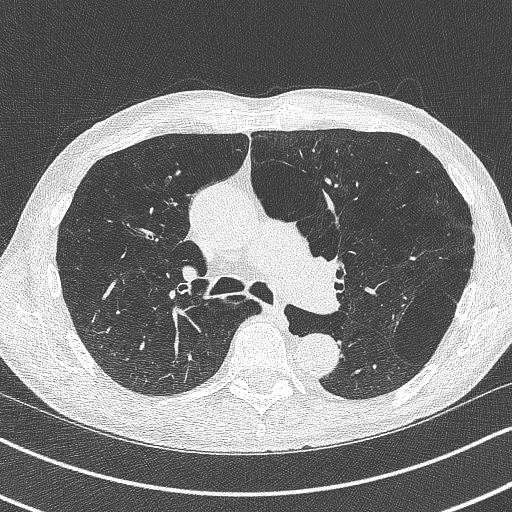
[im 197/310  lung]
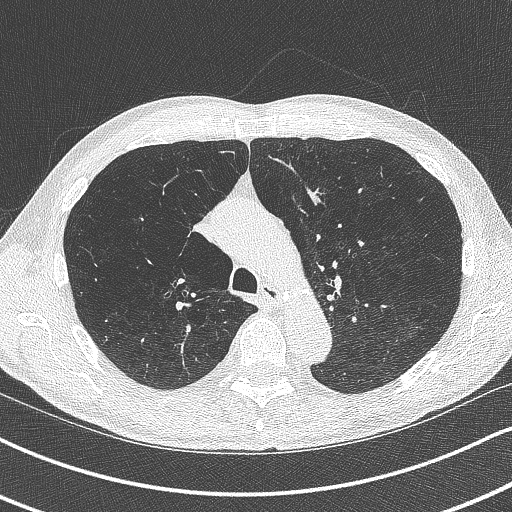
[im 211/310  mediastinal]
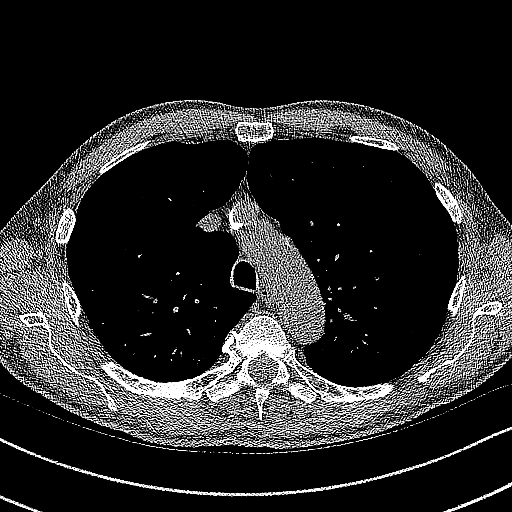
[im 211/310  lung]
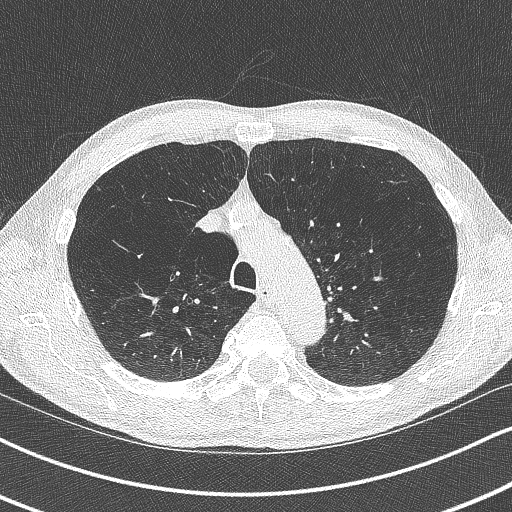
[im 239/310  lung]
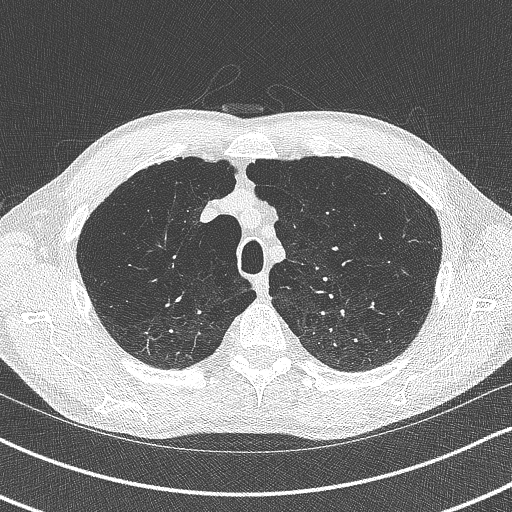
[im 267/310  lung]
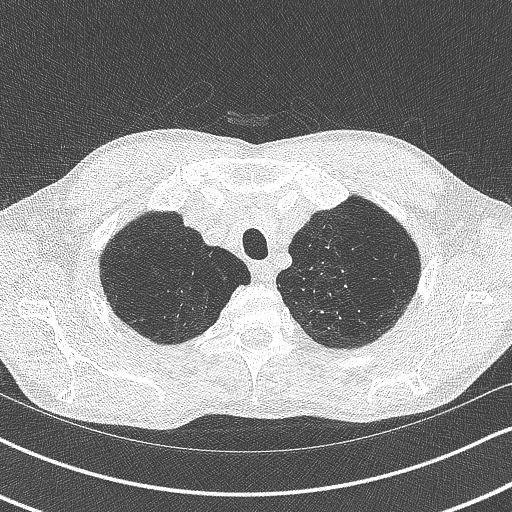
[im 295/310  lung]
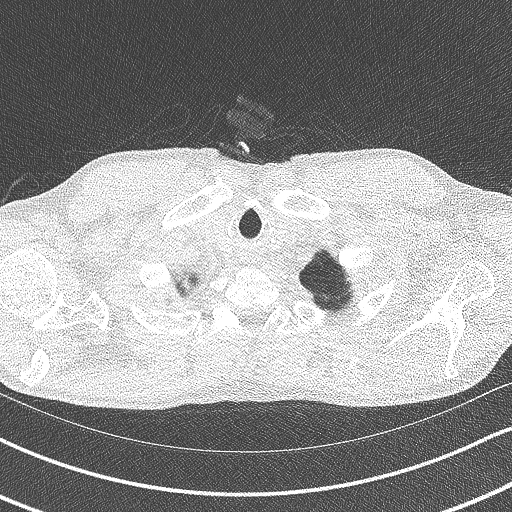

[Series 4: coronal · coronal · 0.62mm/px · 3 of 228 slices shown]
[im 46/228  lung]
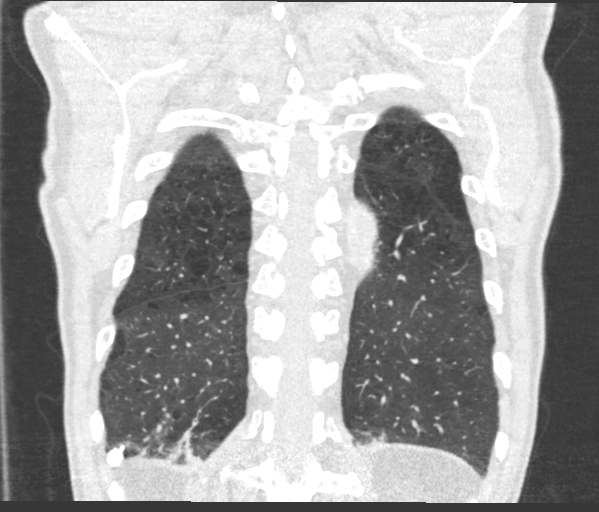
[im 91/228  lung]
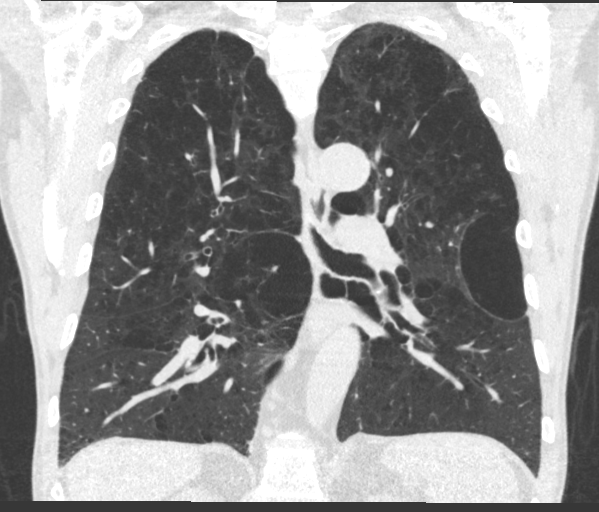
[im 137/228  lung]
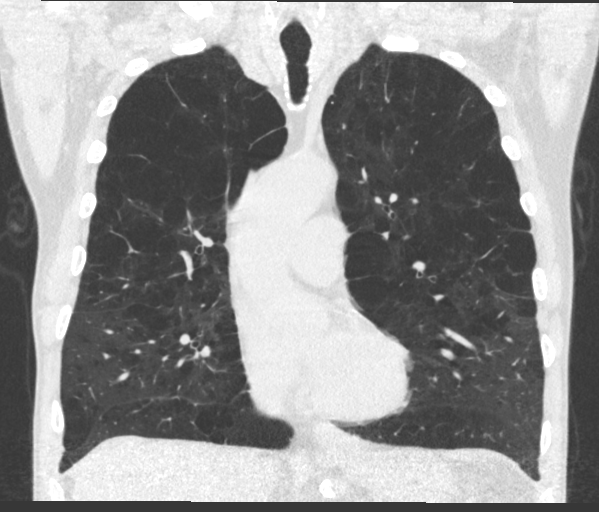

[15 of 36 positions shown; findings below may reference images not displayed]

FINDINGS: Cardiovascular: Aortic atherosclerosis. Ascending aortic dilatation
in the setting of tortuosity. Example 4.2 cm in the mid ascending
segment on transverse image 33 and coronal image 147.

Normal heart size, without pericardial effusion. Lad and left
circumflex coronary artery calcification

Mediastinum/Nodes: No mediastinal or definite hilar adenopathy,
given limitations of unenhanced CT.

Lungs/Pleura: No pleural fluid. Marked bullous emphysema. Lower lobe
predominant bronchial wall thickening. Bibasilar scarring. Left
upper lobe pulmonary nodule may be calcified at volume derived
equivalent diameter 1.8 mm.

A left lower lobe nodular density may represent scarring, including
at volume derived equivalent diameter 7.0 mm on 267/3.

Upper Abdomen: Possible subcentimeter right hepatic lobe too small
to characterize lesion on 62/2. Normal imaged portions of the
spleen, stomach, adrenal glands, kidneys.

Musculoskeletal: No acute osseous abnormality.
IMPRESSION: 1. Lung-RADS 3, probably benign findings. Short-term follow-up in 6
months is recommended with repeat low-dose chest CT without contrast
(please use the following order, "CT CHEST LCS NODULE FOLLOW-UP W/O
CM"). Left lower lobe nodular density which could represent
scarring.
2. Aortic Atherosclerosis (C400L-DE4.4) and Emphysema (C400L-1IH.X).
Coronary artery atherosclerosis.
3. Mild ascending aortic aneurysm, on the order of 4.2 cm. Recommend
attention on follow-up lung cancer screening CT. This recommendation
follows 7484 ACCF/AHA/AATS/ACR/ASA/SCA/NORIMICHI/YAKELIN/ONEE/LIZZETTE Guidelines
for the Diagnosis and Management of Patients with Thoracic Aortic
Disease. Circulation. 7484; 121: E266-e369. Aortic aneurysm NOS
(C400L-C04.1)

## 2022-07-07 ENCOUNTER — Other Ambulatory Visit: Payer: Self-pay | Admitting: Family Medicine

## 2022-07-12 DIAGNOSIS — H33011 Retinal detachment with single break, right eye: Secondary | ICD-10-CM | POA: Diagnosis not present

## 2022-07-12 DIAGNOSIS — H33012 Retinal detachment with single break, left eye: Secondary | ICD-10-CM | POA: Diagnosis not present

## 2022-07-12 DIAGNOSIS — H02834 Dermatochalasis of left upper eyelid: Secondary | ICD-10-CM | POA: Diagnosis not present

## 2022-07-12 DIAGNOSIS — H35373 Puckering of macula, bilateral: Secondary | ICD-10-CM | POA: Diagnosis not present

## 2022-07-12 DIAGNOSIS — H02831 Dermatochalasis of right upper eyelid: Secondary | ICD-10-CM | POA: Diagnosis not present

## 2022-07-12 DIAGNOSIS — H40013 Open angle with borderline findings, low risk, bilateral: Secondary | ICD-10-CM | POA: Diagnosis not present

## 2022-07-12 DIAGNOSIS — H526 Other disorders of refraction: Secondary | ICD-10-CM | POA: Diagnosis not present

## 2022-07-30 ENCOUNTER — Other Ambulatory Visit: Payer: Self-pay | Admitting: Family Medicine

## 2022-09-06 ENCOUNTER — Other Ambulatory Visit: Payer: Self-pay | Admitting: Family Medicine

## 2022-09-15 ENCOUNTER — Other Ambulatory Visit: Payer: Self-pay | Admitting: Family Medicine

## 2022-09-15 DIAGNOSIS — I10 Essential (primary) hypertension: Secondary | ICD-10-CM

## 2022-09-15 NOTE — Telephone Encounter (Signed)
Patient is scheduled 09/23/22.

## 2022-09-23 ENCOUNTER — Encounter: Payer: Self-pay | Admitting: Family Medicine

## 2022-09-23 ENCOUNTER — Ambulatory Visit (INDEPENDENT_AMBULATORY_CARE_PROVIDER_SITE_OTHER): Payer: Medicare Other | Admitting: Family Medicine

## 2022-09-23 VITALS — BP 114/73 | HR 70 | Ht 69.0 in | Wt 143.0 lb

## 2022-09-23 DIAGNOSIS — R7303 Prediabetes: Secondary | ICD-10-CM | POA: Diagnosis not present

## 2022-09-23 DIAGNOSIS — N401 Enlarged prostate with lower urinary tract symptoms: Secondary | ICD-10-CM | POA: Diagnosis not present

## 2022-09-23 DIAGNOSIS — J449 Chronic obstructive pulmonary disease, unspecified: Secondary | ICD-10-CM

## 2022-09-23 DIAGNOSIS — F5101 Primary insomnia: Secondary | ICD-10-CM

## 2022-09-23 DIAGNOSIS — F17210 Nicotine dependence, cigarettes, uncomplicated: Secondary | ICD-10-CM | POA: Diagnosis not present

## 2022-09-23 DIAGNOSIS — C61 Malignant neoplasm of prostate: Secondary | ICD-10-CM

## 2022-09-23 DIAGNOSIS — R350 Frequency of micturition: Secondary | ICD-10-CM

## 2022-09-23 DIAGNOSIS — Z23 Encounter for immunization: Secondary | ICD-10-CM | POA: Diagnosis not present

## 2022-09-23 DIAGNOSIS — E782 Mixed hyperlipidemia: Secondary | ICD-10-CM | POA: Diagnosis not present

## 2022-09-23 DIAGNOSIS — I10 Essential (primary) hypertension: Secondary | ICD-10-CM | POA: Diagnosis not present

## 2022-09-23 MED ORDER — KETOROLAC TROMETHAMINE 30 MG/ML IJ SOLN: 30.0000 mg | Freq: Once | INTRAMUSCULAR | 0 refills | Status: DC

## 2022-09-23 NOTE — Addendum Note (Signed)
Addended by: Ernest Mallick A on: 09/23/2022 02:34 PM   Modules accepted: Orders

## 2022-09-23 NOTE — Assessment & Plan Note (Signed)
Updated lipid panel ordered. He will continue crestor at current strength.

## 2022-09-23 NOTE — Assessment & Plan Note (Signed)
Alprazolam at bedtime.  Has tried other medications without improvement.

## 2022-09-23 NOTE — Addendum Note (Signed)
Addended by: Ernest Mallick A on: 09/23/2022 02:32 PM   Modules accepted: Orders

## 2022-09-23 NOTE — Assessment & Plan Note (Signed)
Blood pressure is at goal at for age and co-morbidities.  I recommend continuation of current medications.  In addition they were instructed to follow a low sodium diet with regular exercise to help to maintain adequate control of blood pressure.  ? ?

## 2022-09-23 NOTE — Progress Notes (Signed)
Jimmy Mayo - 72 y.o. male MRN [id number]  Date of birth: Oct 04, 1950  Subjective Chief Complaint  Patient presents with   Medical Management of Chronic Issues    6 mo fup on htn    HPI Jimmy Mayo is a 72 y.o. male here today for follow up.   He reports that he is doing well.  BP remains well controlled with valsartan/hydrochlorothiazide.  Denies side effects at this time.  He has not had chest pain, shortness of breath, palpitations, headache or vision changes.   Remains on tramadol for management of chronic low back pain and arthritis.  This continues to work pretty well for him.    He feels that he has had side effects of increased joint pain and GI upset with Triple therapy inhalers including Breztri and Trelegy.  He does unfortunately continue to smoke.  He is due for updated lung cancer screening.   ROS:  A comprehensive ROS was completed and negative except as noted per HPI  Allergies  Allergen Reactions   Amoxicillin    Niacin And Related Other (See Comments)    Flushing    Penicillins    Pravastatin Sodium Other (See Comments)    REACTION: diarrhea   Zetia [Ezetimibe]     constipation    Past Medical History:  Diagnosis Date   Anemia    Anxiety    Asthma    Back pain    Chronic kidney disease    Detached retina, bilateral    March 2020   GERD (gastroesophageal reflux disease)    Hypertension    Kidney stone    Retinal detachment 03/31/2018   Rt eye 03/29/18   Rupture long head biceps tendon 03/22/2017   Vitreous detachment of both eyes 03/31/2018   03/29/18    Past Surgical History:  Procedure Laterality Date   COLONOSCOPY W/ BIOPSIES AND POLYPECTOMY     was to have 1 this past year but planing to see new GI in [place] realize he may need onne every 5 years   HYDROCELE EXCISION / REPAIR     x3 or 2 on 1 and 1 on the other   KNEE ARTHROSCOPY     1986   LITHOTRIPSY     NASAL SINUS SURGERY     RETINAL DETACHMENT SURGERY Bilateral    SPINAL  FUSION     2020 and 2021   SPINE SURGERY  01/12/2004   l spine fusion/rods   VASECTOMY      Social History   Socioeconomic History   Marital status: Divorced    Spouse name: Not on file   Number of children: 4   Years of education: 13   Highest education level: 12th grade  Occupational History   Occupation: retired  Tobacco Use   Smoking status: Every Day    Current packs/day: 1.50    Average packs/day: 1.5 packs/day for 35.0 years (52.5 ttl pk-yrs)    Types: Cigarettes   Smokeless tobacco: Never   Tobacco comments:    Not ready to quit at this time   Vaping Use   Vaping status: Former   Substances: Nicotine, Flavoring  Substance and Sexual Activity   Alcohol use: Not Currently    Comment: upsets stomach   Drug use: No   Sexual activity: Not on file  Other Topics Concern   Not on file  Social History Narrative   09/20/2020      Lives alone divorced with 4 children - hobbies stocks  bonds    Social Determinants of Health   Financial Resource Strain: Low Risk  (09/22/2022)   Overall Financial Resource Strain (CARDIA)    Difficulty of Paying Living Expenses: Not hard at all  Food Insecurity: No Food Insecurity (09/22/2022)   Hunger Vital Sign    Worried About Running Out of Food in the Last Year: Never true    Ran Out of Food in the Last Year: Never true  Transportation Needs: No Transportation Needs (09/22/2022)   PRAPARE - Administrator, Civil Service (Medical): No    Lack of Transportation (Non-Medical): No  Physical Activity: Insufficiently Active (09/22/2022)   Exercise Vital Sign    Days of Exercise per Week: 1 day    Minutes of Exercise per Session: 20 min  Stress: Stress Concern Present (09/22/2022)   [name]    Feeling of Stress : To some extent  Social Connections: Socially Isolated (09/22/2022)   Social Connection and Isolation Panel [NHANES]    Frequency of Communication  with Friends and Family: More than three times a week    Frequency of Social Gatherings with Friends and Family: Once a week    Attends Religious Services: Never    Database administrator or Organizations: No    Attends Engineer, structural: Not on file    Marital Status: Divorced    Family History  Problem Relation Age of Onset   Cancer Mother        colon   Cancer Father        colon, prostate   Diabetes Father    Prostate cancer Father    Diabetes Maternal Grandmother     Health Maintenance  Topic Date Due   Lung Cancer Screening  01/20/2022   INFLUENZA VACCINE  08/12/2022   COVID-19 Vaccine (3 - Moderna risk series) 04/10/2023 (Originally 08/07/2019)   Medicare Annual Wellness (AWV)  04/25/2023 (Originally 09/20/2021)   Zoster Vaccines- Shingrix (1 of 2) 06/25/2023 (Originally 07/27/1970)   DTaP/Tdap/Td (3 - Td or Tdap) 09/08/2027   Colonoscopy  06/20/2031   Pneumonia Vaccine 29+ Years old  Completed   Hepatitis C Screening  Completed   HPV VACCINES  Aged Out     ----------------------------------------------------------------------------------------------------------------------------------------------------------------------------------------------------------------- Physical Exam BP 114/73   Pulse 70   Ht 5\' 9"  (1.753 m)   Wt 143 lb (64.9 kg)   SpO2 99%   BMI 21.12 kg/m   Physical Exam Constitutional:      Appearance: Normal appearance.  Eyes:     General: No scleral icterus. Cardiovascular:     Rate and Rhythm: Normal rate and regular rhythm.  Pulmonary:     Effort: Pulmonary effort is normal.     Breath sounds: Normal breath sounds.  Musculoskeletal:     Cervical back: Neck supple.  Neurological:     Mental Status: He is alert.  Psychiatric:        Mood and Affect: Mood normal.        Behavior: Behavior normal.      ------------------------------------------------------------------------------------------------------------------------------------------------------------------------------------------------------------------- Assessment and Plan  Prediabetes Update A1c today.  Hyperlipidemia Updated lipid panel ordered. He will continue crestor at current strength.   COPD (chronic obstructive pulmonary disease) (HCC) Having side effects from triple therapy inhalers.  Continues to smoke.  Encouraged smoking cessation.  Referral to pulmonology.   Insomnia Alprazolam at bedtime.  Has tried other medications without improvement.   Essential hypertension, benign Blood pressure is at goal  at for age and co-morbidities.  I recommend continuation of current medications.  In addition they were instructed to follow a low sodium diet with regular exercise to help to maintain adequate control of blood pressure.     No orders of the defined types were placed in this encounter.   No follow-ups on file.    This visit occurred during the SARS-CoV-2 public health emergency.  Safety protocols were in place, including screening questions prior to the visit, additional usage of staff PPE, and extensive cleaning of exam room while observing appropriate contact time as indicated for disinfecting solutions.

## 2022-09-23 NOTE — Assessment & Plan Note (Signed)
Update A1c today 

## 2022-09-23 NOTE — Assessment & Plan Note (Signed)
Having side effects from triple therapy inhalers.  Continues to smoke.  Encouraged smoking cessation.  Referral to pulmonology.

## 2022-09-23 NOTE — Addendum Note (Signed)
Addended by: Ernest Mallick A on: 09/23/2022 02:10 PM   Modules accepted: Orders

## 2022-09-24 ENCOUNTER — Other Ambulatory Visit: Payer: Self-pay

## 2022-09-24 DIAGNOSIS — I10 Essential (primary) hypertension: Secondary | ICD-10-CM

## 2022-09-24 LAB — CMP14+EGFR
ALT: 20 IU/L (ref 0–44)
AST: 19 IU/L (ref 0–40)
Albumin: 5.1 g/dL — ABNORMAL HIGH (ref 3.8–4.8)
Alkaline Phosphatase: 65 IU/L (ref 44–121)
BUN/Creatinine Ratio: 11 (ref 10–24)
BUN: 10 mg/dL (ref 8–27)
Bilirubin Total: 0.4 mg/dL (ref 0.0–1.2)
CO2: 25 mmol/L (ref 20–29)
Calcium: 10.1 mg/dL (ref 8.6–10.2)
Chloride: 102 mmol/L (ref 96–106)
Creatinine, Ser: 0.88 mg/dL (ref 0.76–1.27)
Globulin, Total: 1.7 g/dL (ref 1.5–4.5)
Glucose: 91 mg/dL (ref 70–99)
Potassium: 4.4 mmol/L (ref 3.5–5.2)
Sodium: 144 mmol/L (ref 134–144)
Total Protein: 6.8 g/dL (ref 6.0–8.5)
eGFR: 92 mL/min/{1.73_m2} (ref >59)

## 2022-09-24 LAB — CBC WITH DIFFERENTIAL/PLATELET
Basophils Absolute: 0.1 10*3/uL (ref 0.0–0.2)
Basos: 1 %
EOS (ABSOLUTE): 0.2 10*3/uL (ref 0.0–0.4)
Eos: 2 %
Hematocrit: 44.9 % (ref 37.5–51.0)
Hemoglobin: 14.6 g/dL (ref 13.0–17.7)
Immature Grans (Abs): 0 10*3/uL (ref 0.0–0.1)
Immature Granulocytes: 0 %
Lymphocytes Absolute: 2.1 10*3/uL (ref 0.7–3.1)
Lymphs: 25 %
MCH: 30 pg (ref 26.6–33.0)
MCHC: 32.5 g/dL (ref 31.5–35.7)
MCV: 92 fL (ref 79–97)
Monocytes Absolute: 0.7 10*3/uL (ref 0.1–0.9)
Monocytes: 8 %
Neutrophils Absolute: 5.4 10*3/uL (ref 1.4–7.0)
Neutrophils: 64 %
Platelets: 267 10*3/uL (ref 150–450)
RBC: 4.86 x10E6/uL (ref 4.14–5.80)
RDW: 13.3 % (ref 11.6–15.4)
WBC: 8.6 10*3/uL (ref 3.4–10.8)

## 2022-09-24 LAB — LIPID PANEL WITH LDL/HDL RATIO
Cholesterol, Total: 116 mg/dL (ref 100–199)
HDL: 49 mg/dL (ref >39)
LDL Chol Calc (NIH): 52 mg/dL (ref 0–99)
LDL/HDL Ratio: 1.1 ratio (ref 0.0–3.6)
Triglycerides: 71 mg/dL (ref 0–149)
VLDL Cholesterol Cal: 15 mg/dL (ref 5–40)

## 2022-09-24 LAB — HEMOGLOBIN A1C
Est. average glucose Bld gHb Est-mCnc: 123 mg/dL
Hgb A1c MFr Bld: 5.9 % — ABNORMAL HIGH (ref 4.8–5.6)

## 2022-09-24 MED ORDER — VALSARTAN-HYDROCHLOROTHIAZIDE 160-12.5 MG PO TABS: 1.0000 | ORAL_TABLET | Freq: Every day | ORAL | 0 refills | Status: DC

## 2022-09-24 NOTE — Telephone Encounter (Signed)
Med refill

## 2022-10-04 ENCOUNTER — Other Ambulatory Visit: Payer: Self-pay | Admitting: Family Medicine

## 2022-10-07 ENCOUNTER — Other Ambulatory Visit: Payer: Self-pay | Admitting: Family Medicine

## 2022-10-11 ENCOUNTER — Other Ambulatory Visit: Payer: Self-pay | Admitting: Family Medicine

## 2022-10-20 ENCOUNTER — Other Ambulatory Visit: Payer: Self-pay | Admitting: Family Medicine

## 2022-10-20 DIAGNOSIS — I10 Essential (primary) hypertension: Secondary | ICD-10-CM

## 2022-11-15 ENCOUNTER — Other Ambulatory Visit: Payer: Self-pay | Admitting: Family Medicine

## 2022-12-14 ENCOUNTER — Other Ambulatory Visit: Payer: Self-pay | Admitting: Family Medicine

## 2022-12-14 DIAGNOSIS — K269 Duodenal ulcer, unspecified as acute or chronic, without hemorrhage or perforation: Secondary | ICD-10-CM

## 2022-12-15 ENCOUNTER — Other Ambulatory Visit: Payer: Self-pay | Admitting: Family Medicine

## 2023-01-20 ENCOUNTER — Telehealth: Payer: Self-pay

## 2023-01-21 MED ORDER — TRAMADOL HCL 50 MG PO TABS: ORAL_TABLET | ORAL | 2 refills | Status: DC

## 2023-01-21 NOTE — Telephone Encounter (Signed)
 Sent to PCP ?

## 2023-01-27 ENCOUNTER — Other Ambulatory Visit: Payer: Self-pay | Admitting: Family Medicine

## 2023-02-08 DIAGNOSIS — J449 Chronic obstructive pulmonary disease, unspecified: Secondary | ICD-10-CM | POA: Diagnosis not present

## 2023-02-08 DIAGNOSIS — F17210 Nicotine dependence, cigarettes, uncomplicated: Secondary | ICD-10-CM | POA: Diagnosis not present

## 2023-02-08 DIAGNOSIS — R0602 Shortness of breath: Secondary | ICD-10-CM | POA: Diagnosis not present

## 2023-02-15 ENCOUNTER — Encounter: Payer: Self-pay | Admitting: Family Medicine

## 2023-02-15 ENCOUNTER — Ambulatory Visit (INDEPENDENT_AMBULATORY_CARE_PROVIDER_SITE_OTHER): Payer: Medicare Other | Admitting: Family Medicine

## 2023-02-15 VITALS — BP 103/68 | HR 73 | Temp 98.2°F | Ht 69.0 in | Wt 142.0 lb

## 2023-02-15 DIAGNOSIS — J0100 Acute maxillary sinusitis, unspecified: Secondary | ICD-10-CM | POA: Insufficient documentation

## 2023-02-15 MED ORDER — AZITHROMYCIN 250 MG PO TABS: ORAL_TABLET | ORAL | 0 refills | Status: AC

## 2023-02-15 NOTE — Progress Notes (Signed)
 Jimmy Mayo - 73 y.o. male MRN [id number]  Date of birth: 11-28-1950  Subjective Chief Complaint  Patient presents with   Sinusitis    HPI Jimmy Mayo is a 73 y.o. male here today with complaint of congestion, sinus pain and pressure most notable on the R side.  Symptoms started about 4 days ago.  Describes mucus as thick and lime green in coloration.  He has not had fever or chills.  This is similar to previous episodes of sinusitis that he has had in the past.  Currently he is taking benadryl  without much improvement.  Azithromycin  has worked best for him in the past.   ROS:  A comprehensive ROS was completed and negative except as noted per HPI  Allergies  Allergen Reactions   Amoxicillin    Niacin And Related Other (See Comments)    Flushing    Penicillins    Pravastatin Sodium Other (See Comments)    REACTION: diarrhea   Zetia  [Ezetimibe ]     constipation    Past Medical History:  Diagnosis Date   Anemia    Anxiety    Asthma    Back pain    Chronic kidney disease    Detached retina, bilateral    March 2020   GERD (gastroesophageal reflux disease)    Hypertension    Kidney stone    Retinal detachment 03/31/2018   Rt eye 03/29/18   Rupture long head biceps tendon 03/22/2017   Vitreous detachment of both eyes 03/31/2018   03/29/18    Past Surgical History:  Procedure Laterality Date   COLONOSCOPY W/ BIOPSIES AND POLYPECTOMY     was to have 1 this past year but planing to see new GI in [place] realize he may need onne every 5 years   HYDROCELE EXCISION / REPAIR     x3 or 2 on 1 and 1 on the other   KNEE ARTHROSCOPY     1986   LITHOTRIPSY     NASAL SINUS SURGERY     RETINAL DETACHMENT SURGERY Bilateral    SPINAL FUSION     2020 and 2021   SPINE SURGERY  01/12/2004   l spine fusion/rods   VASECTOMY      Social History   Socioeconomic History   Marital status: Divorced    Spouse name: Not on file   Number of children: 4   Years of education:  13   Highest education level: 12th grade  Occupational History   Occupation: retired  Tobacco Use   Smoking status: Every Day    Current packs/day: 1.50    Average packs/day: 1.5 packs/day for 35.0 years (52.5 ttl pk-yrs)    Types: Cigarettes   Smokeless tobacco: Never   Tobacco comments:    Not ready to quit at this time   Vaping Use   Vaping status: Former   Substances: Nicotine, Flavoring  Substance and Sexual Activity   Alcohol use: Not Currently    Comment: upsets stomach   Drug use: No   Sexual activity: Not on file  Other Topics Concern   Not on file  Social History Narrative   09/20/2020      Lives alone divorced with 4 children - hobbies stocks bonds    Social Drivers of Health   Financial Resource Strain: Low Risk  (02/14/2023)   Overall Financial Resource Strain (CARDIA)    Difficulty of Paying Living Expenses: Not hard at all  Food Insecurity: No Food Insecurity (02/14/2023)  Hunger Vital Sign    Worried About Running Out of Food in the Last Year: Never true    Ran Out of Food in the Last Year: Never true  Transportation Needs: No Transportation Needs (02/14/2023)   PRAPARE - Administrator, Civil Service (Medical): No    Lack of Transportation (Non-Medical): No  Physical Activity: Insufficiently Active (02/14/2023)   Exercise Vital Sign    Days of Exercise per Week: 1 day    Minutes of Exercise per Session: 20 min  Stress: Stress Concern Present (02/14/2023)   [name]    Feeling of Stress : To some extent  Social Connections: Socially Isolated (02/14/2023)   Social Connection and Isolation Panel [NHANES]    Frequency of Communication with Friends and Family: More than three times a week    Frequency of Social Gatherings with Friends and Family: Once a week    Attends Religious Services: Never    Database Administrator or Organizations: No    Attends Engineer, Structural: Not  on file    Marital Status: Divorced    Family History  Problem Relation Age of Onset   Cancer Mother        colon   Cancer Father        colon, prostate   Diabetes Father    Prostate cancer Father    Diabetes Maternal Grandmother     Health Maintenance  Topic Date Due   Lung Cancer Screening  01/20/2022   COVID-19 Vaccine (3 - Moderna risk series) 04/10/2023 (Originally 08/07/2019)   Medicare Annual Wellness (AWV)  04/25/2023 (Originally 09/20/2021)   Zoster Vaccines- Shingrix (1 of 2) 06/25/2023 (Originally 07/27/1970)   DTaP/Tdap/Td (3 - Td or Tdap) 09/08/2027   Colonoscopy  06/20/2031   Pneumonia Vaccine 3+ Years old  Completed   INFLUENZA VACCINE  Completed   Hepatitis C Screening  Completed   HPV VACCINES  Aged Out     ----------------------------------------------------------------------------------------------------------------------------------------------------------------------------------------------------------------- Physical Exam BP 103/68   Pulse 73   Temp 98.2 F (36.8 C) (Oral)   Ht 5' 9 (1.753 m)   Wt 142 lb (64.4 kg)   SpO2 96%   BMI 20.97 kg/m   Physical Exam Constitutional:      Appearance: Normal appearance.  HENT:     Head: Normocephalic and atraumatic.     Nose:     Comments: R maxillary sinus tenderness.  Eyes:     General: No scleral icterus. Cardiovascular:     Rate and Rhythm: Normal rate and regular rhythm.  Pulmonary:     Effort: Pulmonary effort is normal.     Breath sounds: Normal breath sounds.  Musculoskeletal:     Cervical back: Neck supple.  Neurological:     Mental Status: He is alert.  Psychiatric:        Mood and Affect: Mood normal.        Behavior: Behavior normal.     ------------------------------------------------------------------------------------------------------------------------------------------------------------------------------------------------------------------- Assessment and Plan  Acute  maxillary sinusitis Adding course of azithromycin  as this has worked well for him. Increased fluid intake recommended.  Red flags reviewed.    Meds ordered this encounter  Medications   azithromycin  (ZITHROMAX ) 250 MG tablet    Sig: Take 2 tablets on day 1, then 1 tablet daily on days 2 through 5    Dispense:  6 tablet    Refill:  0    No follow-ups on file.  This visit occurred during the SARS-CoV-2 public health emergency.  Safety protocols were in place, including screening questions prior to the visit, additional usage of staff PPE, and extensive cleaning of exam room while observing appropriate contact time as indicated for disinfecting solutions.

## 2023-02-15 NOTE — Assessment & Plan Note (Signed)
Adding course of azithromycin as this has worked well for him. Increased fluid intake recommended.  Red flags reviewed.

## 2023-03-21 ENCOUNTER — Other Ambulatory Visit: Payer: Self-pay | Admitting: Family Medicine

## 2023-03-25 ENCOUNTER — Other Ambulatory Visit: Payer: Self-pay | Admitting: Family Medicine

## 2023-04-20 ENCOUNTER — Other Ambulatory Visit: Payer: Self-pay | Admitting: Family Medicine

## 2023-04-26 ENCOUNTER — Other Ambulatory Visit: Payer: Self-pay | Admitting: Family Medicine

## 2023-04-26 MED ORDER — TRAMADOL HCL 50 MG PO TABS: ORAL_TABLET | ORAL | 2 refills | Status: DC

## 2023-04-26 NOTE — Telephone Encounter (Signed)
 Copied from CRM [id number]. Topic: Clinical - Prescription Issue >> Apr 26, 2023  2:52 PM Lenon Radar A wrote: Reason for CRM: Patient called in regarding traMADol (ULTRAM) 50 MG tablet stated that he requested refill last week and was advise that it is too soon to refill as he just received a 90 days supply. Patient stated that he takes 3 pills per day and he did not get a 90 day supply and is all out. Please contact patient regarding refill via MyChart or phone at [phone].

## 2023-05-16 ENCOUNTER — Other Ambulatory Visit: Payer: Self-pay

## 2023-05-16 DIAGNOSIS — I10 Essential (primary) hypertension: Secondary | ICD-10-CM

## 2023-05-16 MED ORDER — VALSARTAN-HYDROCHLOROTHIAZIDE 160-12.5 MG PO TABS: 1.0000 | ORAL_TABLET | Freq: Every day | ORAL | 1 refills | Status: DC

## 2023-05-18 ENCOUNTER — Ambulatory Visit (INDEPENDENT_AMBULATORY_CARE_PROVIDER_SITE_OTHER): Admitting: Family Medicine

## 2023-05-18 ENCOUNTER — Encounter: Payer: Self-pay | Admitting: Family Medicine

## 2023-05-18 VITALS — BP 113/68 | HR 77 | Ht 69.0 in | Wt 139.0 lb

## 2023-05-18 DIAGNOSIS — B370 Candidal stomatitis: Secondary | ICD-10-CM

## 2023-05-18 DIAGNOSIS — J449 Chronic obstructive pulmonary disease, unspecified: Secondary | ICD-10-CM

## 2023-05-18 MED ORDER — NYSTATIN 100000 UNIT/ML MT SUSP: 500000.0000 [IU] | Freq: Four times a day (QID) | OROMUCOSAL | 0 refills | Status: AC

## 2023-05-18 MED ORDER — BUDESONIDE-FORMOTEROL FUMARATE 160-4.5 MCG/ACT IN AERO: 2.0000 | INHALATION_SPRAY | Freq: Two times a day (BID) | RESPIRATORY_TRACT | 11 refills | Status: DC

## 2023-05-18 MED ORDER — SPACER/AERO-HOLDING CHAMBERS DEVI: 0 refills | Status: DC

## 2023-05-18 NOTE — Progress Notes (Signed)
 Jimmy Mayo - 73 y.o. male MRN [id number]  Date of birth: 16-Oct-1950  Subjective Chief Complaint  Patient presents with   Medication Problem    HPI Jimmy Mayo is a 73 y.o. male here today for follow up visit.   He was tried on trelegy and breztri  for COPD and had side effects of dry mouth, vision change and dizziness.  He was previously on symbicort.  Feels that this worked just as well without the side effects.  He would like to change back to this.  He also notes burning sensation in mouth.  He just recently started rinsing after using inhaler.   ROS:  A comprehensive ROS was completed and negative except as noted per HPI  Allergies  Allergen Reactions   Amoxicillin    Niacin And Related Other (See Comments)    Flushing    Penicillins    Pravastatin Sodium Other (See Comments)    REACTION: diarrhea   Zetia  [Ezetimibe ]     constipation    Past Medical History:  Diagnosis Date   Anemia    Anxiety    Asthma    Back pain    Chronic kidney disease    Detached retina, bilateral    March 2020   GERD (gastroesophageal reflux disease)    Hypertension    Kidney stone    Retinal detachment 03/31/2018   Rt eye 03/29/18   Rupture long head biceps tendon 03/22/2017   Vitreous detachment of both eyes 03/31/2018   03/29/18    Past Surgical History:  Procedure Laterality Date   COLONOSCOPY W/ BIOPSIES AND POLYPECTOMY     was to have 1 this past year but planing to see new GI in [place] realize he may need onne every 5 years   HYDROCELE EXCISION / REPAIR     x3 or 2 on 1 and 1 on the other   KNEE ARTHROSCOPY     1986   LITHOTRIPSY     NASAL SINUS SURGERY     RETINAL DETACHMENT SURGERY Bilateral    SPINAL FUSION     2020 and 2021   SPINE SURGERY  01/12/2004   l spine fusion/rods   VASECTOMY      Social History   Socioeconomic History   Marital status: Divorced    Spouse name: Not on file   Number of children: 4   Years of education: 13   Highest education  level: 12th grade  Occupational History   Occupation: retired  Tobacco Use   Smoking status: Every Day    Current packs/day: 1.50    Average packs/day: 1.5 packs/day for 35.0 years (52.5 ttl pk-yrs)    Types: Cigarettes   Smokeless tobacco: Never   Tobacco comments:    Not ready to quit at this time   Vaping Use   Vaping status: Former   Substances: Nicotine, Flavoring  Substance and Sexual Activity   Alcohol use: Not Currently    Comment: upsets stomach   Drug use: No   Sexual activity: Not on file  Other Topics Concern   Not on file  Social History Narrative   09/20/2020      Lives alone divorced with 4 children - hobbies stocks bonds    Social Drivers of Health   Financial Resource Strain: Low Risk  (02/14/2023)   Overall Financial Resource Strain (CARDIA)    Difficulty of Paying Living Expenses: Not hard at all  Food Insecurity: No Food Insecurity (02/14/2023)   Hunger Vital  Sign    Worried About Programme researcher, broadcasting/film/video in the Last Year: Never true    Ran Out of Food in the Last Year: Never true  Transportation Needs: No Transportation Needs (02/14/2023)   PRAPARE - Administrator, Civil Service (Medical): No    Lack of Transportation (Non-Medical): No  Physical Activity: Insufficiently Active (02/14/2023)   Exercise Vital Sign    Days of Exercise per Week: 1 day    Minutes of Exercise per Session: 20 min  Stress: Stress Concern Present (02/14/2023)   [name]    Feeling of Stress : To some extent  Social Connections: Socially Isolated (02/14/2023)   Social Connection and Isolation Panel [NHANES]    Frequency of Communication with Friends and Family: More than three times a week    Frequency of Social Gatherings with Friends and Family: Once a week    Attends Religious Services: Never    Database administrator or Organizations: No    Attends Engineer, structural: Not on file    Marital  Status: Divorced    Family History  Problem Relation Age of Onset   Cancer Mother        colon   Cancer Father        colon, prostate   Diabetes Father    Prostate cancer Father    Diabetes Maternal Grandmother     Health Maintenance  Topic Date Due   COVID-19 Vaccine (3 - Moderna risk series) 08/07/2019   Medicare Annual Wellness (AWV)  09/20/2021   Lung Cancer Screening  01/20/2022   Zoster Vaccines- Shingrix (1 of 2) 06/25/2023 (Originally 07/27/1970)   INFLUENZA VACCINE  08/12/2023   DTaP/Tdap/Td (3 - Td or Tdap) 09/08/2027   Colonoscopy  06/20/2031   Pneumonia Vaccine 36+ Years old  Completed   Hepatitis C Screening  Completed   HPV VACCINES  Aged Out   Meningococcal B Vaccine  Aged Out     ----------------------------------------------------------------------------------------------------------------------------------------------------------------------------------------------------------------- Physical Exam BP 113/68 (BP Location: Left Arm, Patient Position: Sitting, Cuff Size: Small)   Pulse 77   Ht 5\' 9"  (1.753 m)   Wt 139 lb (63 kg)   SpO2 97%   BMI 20.53 kg/m   Physical Exam Constitutional:      Appearance: Normal appearance.  Eyes:     General: No scleral icterus. Cardiovascular:     Rate and Rhythm: Normal rate and regular rhythm.  Pulmonary:     Effort: Pulmonary effort is normal.     Breath sounds: Normal breath sounds.  Neurological:     Mental Status: He is alert.  Psychiatric:        Mood and Affect: Mood normal.        Behavior: Behavior normal.     ------------------------------------------------------------------------------------------------------------------------------------------------------------------------------------------------------------------- Assessment and Plan  COPD (chronic obstructive pulmonary disease) (HCC) He has noted side effects from Breztri  and Trelegy which seem to be side effects from anti-cholinergic.  Will  change back to symbicort.  Reminded to rinse after each use as he appears to have some thrush.    Thrush Treating with nystatin .     Meds ordered this encounter  Medications   Spacer/Aero-Holding Idelle Majors DEVI    Sig: Use wit albuterol  inhaler    Dispense:  1 each    Refill:  0   budesonide-formoterol (SYMBICORT) 160-4.5 MCG/ACT inhaler    Sig: Inhale 2 puffs into the lungs 2 (two) times daily.  Dispense:  6 g    Refill:  11   nystatin  (MYCOSTATIN ) 100000 UNIT/ML suspension    Sig: Take 5 mLs (500,000 Units total) by mouth 4 (four) times daily for 7 days. Swish for 30 seconds and spit out.    Dispense:  180 mL    Refill:  0    No follow-ups on file.

## 2023-05-18 NOTE — Assessment & Plan Note (Signed)
Treating with nystatin.

## 2023-05-18 NOTE — Assessment & Plan Note (Signed)
 He has noted side effects from Breztri  and Trelegy which seem to be side effects from anti-cholinergic.  Will change back to symbicort.  Reminded to rinse after each use as he appears to have some thrush.

## 2023-06-02 ENCOUNTER — Other Ambulatory Visit: Payer: Self-pay | Admitting: Family Medicine

## 2023-06-02 NOTE — Telephone Encounter (Signed)
 Requesting rx rf of alprzaolam 0.5mg  Last written 03/21/2023 Last OV 05/18/2023 Upcoming appt = none

## 2023-06-02 NOTE — Telephone Encounter (Signed)
 Copied from CRM [id number]. Topic: Clinical - Medication Refill >> Jun 02, 2023 11:23 AM Tiffany H wrote: Medication: ALPRAZolam  (XANAX ) 0.5 MG tablet [[id number]]  Has the patient contacted their pharmacy? Yes (Agent: If no, request that the patient contact the pharmacy for the refill. If patient does not wish to contact the pharmacy document the reason why and proceed with request.) (Agent: If yes, when and what did the pharmacy advise?)  This is the patient's preferred pharmacy:  [id number] - [place], [place] - [address] AT [facility] OF [address] [place] [address] Phone: [phone] Fax: [phone]  Is this the correct pharmacy for this prescription? Yes If no, delete pharmacy and type the correct one.   Has the prescription been filled recently? No  Is the patient out of the medication? Yes  Has the patient been seen for an appointment in the last year OR does the patient have an upcoming appointment? Yes  Can we respond through MyChart? Yes  Agent: Please be advised that Rx refills may take up to 3 business days. We ask that you follow-up with your pharmacy.

## 2023-06-03 MED ORDER — ALPRAZOLAM 0.5 MG PO TABS: ORAL_TABLET | ORAL | 0 refills | Status: DC

## 2023-06-14 ENCOUNTER — Other Ambulatory Visit: Payer: Self-pay

## 2023-06-14 DIAGNOSIS — K269 Duodenal ulcer, unspecified as acute or chronic, without hemorrhage or perforation: Secondary | ICD-10-CM

## 2023-06-14 MED ORDER — PANTOPRAZOLE SODIUM 40 MG PO TBEC: 40.0000 mg | DELAYED_RELEASE_TABLET | Freq: Every day | ORAL | 3 refills | Status: DC

## 2023-06-22 ENCOUNTER — Other Ambulatory Visit: Payer: Self-pay | Admitting: Family Medicine

## 2023-06-24 ENCOUNTER — Other Ambulatory Visit: Payer: Self-pay | Admitting: Family Medicine

## 2023-07-29 ENCOUNTER — Other Ambulatory Visit: Payer: Self-pay | Admitting: Family Medicine

## 2023-07-29 DIAGNOSIS — K269 Duodenal ulcer, unspecified as acute or chronic, without hemorrhage or perforation: Secondary | ICD-10-CM

## 2023-07-29 NOTE — Telephone Encounter (Signed)
 Copied from CRM [id number]. Topic: Clinical - Medication Refill >> Jul 29, 2023  1:12 PM Carmell R wrote: Medication: pantoprazole  (PROTONIX ) 40 MG tablet, 90 day supply. This should be for one tablet twice a day, not how the last rx was written.   Has the patient contacted their pharmacy? Yes. Having difficulties with requesting the rx. Need one sent over for correct quantity and directions.  This is the patient's preferred pharmacy:  [facility] #[id number] - [place],  - [address] AT [facility] [address] [place] [address] Phone: [phone] Fax: [phone]  Is this the correct pharmacy for this prescription? Yes If no, delete pharmacy and type the correct one.   Has the prescription been filled recently? Yes  Is the patient out of the medication? No, has only 4 days left.  Has the patient been seen for an appointment in the last year OR does the patient have an upcoming appointment? Yes  Can we respond through MyChart? Yes  Agent: Please be advised that Rx refills may take up to 3 business days. We ask that you follow-up with your pharmacy.

## 2023-07-31 MED ORDER — PANTOPRAZOLE SODIUM 40 MG PO TBEC: 40.0000 mg | DELAYED_RELEASE_TABLET | Freq: Every day | ORAL | 3 refills | Status: DC

## 2023-08-10 ENCOUNTER — Telehealth: Payer: Self-pay

## 2023-08-10 ENCOUNTER — Other Ambulatory Visit: Payer: Self-pay | Admitting: Family Medicine

## 2023-08-10 DIAGNOSIS — K269 Duodenal ulcer, unspecified as acute or chronic, without hemorrhage or perforation: Secondary | ICD-10-CM

## 2023-08-10 NOTE — Telephone Encounter (Signed)
 Jimmy Mayo states the pantoprazole  was changed from 2 tablet daily to 1 tablet daily. He is unable to pick up the prescription because of it being too soon because of the decrease.

## 2023-08-10 NOTE — Telephone Encounter (Signed)
 Copied from CRM [id number]. Topic: Clinical - Medication Refill >> Aug 10, 2023 11:53 AM Miquel SAILOR wrote: Medication: pantoprazole  (PROTONIX ) 40 MG tablet traMADol  (ULTRAM ) 50 MG tablet   Has the patient contacted their pharmacy? Yes (Agent: If no, request that the patient contact the pharmacy for the refill. If patient does not wish to contact the pharmacy document the reason why and proceed with request.) (Agent: If yes, when and what did the pharmacy advise?)  This is the patient's preferred pharmacy:  [id number] - [place], [place] - [address] AT [facility] OF [address] [place] [address] Phone: [phone] Fax: [phone]  Is this the correct pharmacy for this prescription? Yes If no, delete pharmacy and type the correct one.   Has the prescription been filled recently? Yes  Is the patient out of the medication? Yes  Has the patient been seen for an appointment in the last year OR does the patient have an upcoming appointment? Yes  Can we respond through MyChart? Yes  Agent: Please be advised that Rx refills may take up to 3 business days. We ask that you follow-up with your pharmacy.

## 2023-08-10 NOTE — Telephone Encounter (Signed)
 Copied from CRM [id number]. Topic: Clinical - Medication Question >> Aug 10, 2023 11:51 AM Miquel SAILOR wrote: Reason for CRM: pantoprazole  (PROTONIX ) 40 MG tablet-Patient needs call back due to clarifications on dosage instructions. Sig: TAKE 1 TABLET(50 MG) BY MOUTH EVERY 8 HOURS AS NEEDED  [phone]

## 2023-08-11 MED ORDER — PANTOPRAZOLE SODIUM 40 MG PO TBEC: 40.0000 mg | DELAYED_RELEASE_TABLET | Freq: Two times a day (BID) | ORAL | 0 refills | Status: DC

## 2023-08-11 NOTE — Telephone Encounter (Signed)
 The prescription has been corrected to reflect Protonix  40 mg twice daily.  ___________________________________________ Zada FREDRIK Palin, DNP, APRN, FNP-BC Primary Care and Sports Medicine [facility] [place] [place]

## 2023-08-11 NOTE — Telephone Encounter (Signed)
 He states the Protonix  is 40 mg twice daily. He states it was changed to once daily by mistake.

## 2023-08-11 NOTE — Telephone Encounter (Signed)
 Patient advised.

## 2023-08-16 ENCOUNTER — Other Ambulatory Visit: Payer: Self-pay

## 2023-08-16 MED ORDER — TRAMADOL HCL 50 MG PO TABS: 50.0000 mg | ORAL_TABLET | Freq: Three times a day (TID) | ORAL | 0 refills | Status: DC | PRN

## 2023-08-16 NOTE — Telephone Encounter (Signed)
 Copied from CRM #[id number]. Topic: General - Other >> Aug 15, 2023  3:09 PM Adrianna P wrote: Reason for CRM: Patient called regarding prescription for Tramadol . Informed the patient that the refill was ordered    ----------------------------------------------------------------------- From previous Reason for Contact - Medication Refill: Medication: traMADol  (ULTRAM ) 50 MG tablet  Has the patient contacted their pharmacy? Yes (Agent: If no, request that the patient contact the pharmacy for the refill. If patient does not wish to contact the pharmacy document the reason why and proceed with request.) (Agent: If yes, when and what did the pharmacy advise?)  This is the patient's preferred pharmacy:  [id number] - [place], [place] - [address] AT [facility] OF [address] [place] [address] Phone: [phone] Fax: [phone]  Is this the correct pharmacy for this prescription?   If no, delete pharmacy and type the correct one.   Has the prescription been filled recently?    Is the patient out of the medication?    Has the patient been seen for an appointment in the last year OR does the patient have an upcoming appointment?    Can we respond through MyChart?    Agent: Please be advised that Rx refills may take up to 3 business days. We ask that you follow-up with your pharmacy. >> Aug 15, 2023  3:32 PM Carrielelia G wrote: Pt. Honea was calling on the status of his medication:TraMADol  (ULTRAM ) 50 MG tablet    This Message was in his chart:   An error was encountered while attempting to calculate the morphine milligram equivalents per day for at least one order.    Patient states he is out of medication and wanted to know when will this be corrected.

## 2023-08-16 NOTE — Telephone Encounter (Signed)
 Forwarding message to Zada Palin, NP covering DR. Alvia Requesting rx rf of Tramadol  50mg   Last written 04/15/225 Last OV 05/18/2023 Upcoming appt = none

## 2023-09-15 ENCOUNTER — Encounter: Payer: Self-pay | Admitting: Family Medicine

## 2023-09-15 ENCOUNTER — Other Ambulatory Visit: Payer: Self-pay | Admitting: Family Medicine

## 2023-09-15 ENCOUNTER — Other Ambulatory Visit: Payer: Self-pay | Admitting: Medical-Surgical

## 2023-09-15 ENCOUNTER — Ambulatory Visit (INDEPENDENT_AMBULATORY_CARE_PROVIDER_SITE_OTHER): Admitting: Family Medicine

## 2023-09-15 VITALS — BP 126/73 | HR 71 | Ht 69.0 in | Wt 137.0 lb

## 2023-09-15 DIAGNOSIS — G894 Chronic pain syndrome: Secondary | ICD-10-CM | POA: Diagnosis not present

## 2023-09-15 DIAGNOSIS — J0100 Acute maxillary sinusitis, unspecified: Secondary | ICD-10-CM

## 2023-09-15 DIAGNOSIS — J441 Chronic obstructive pulmonary disease with (acute) exacerbation: Secondary | ICD-10-CM | POA: Insufficient documentation

## 2023-09-15 DIAGNOSIS — F5101 Primary insomnia: Secondary | ICD-10-CM | POA: Diagnosis not present

## 2023-09-15 DIAGNOSIS — I10 Essential (primary) hypertension: Secondary | ICD-10-CM

## 2023-09-15 MED ORDER — ALPRAZOLAM 0.5 MG PO TABS: ORAL_TABLET | ORAL | 1 refills | Status: DC

## 2023-09-15 MED ORDER — AZITHROMYCIN 250 MG PO TABS: ORAL_TABLET | ORAL | 0 refills | Status: AC

## 2023-09-15 MED ORDER — PREDNISONE 20 MG PO TABS
20.0000 mg | ORAL_TABLET | Freq: Two times a day (BID) | ORAL | 0 refills | Status: AC
Start: 2023-09-15 — End: 2023-09-20

## 2023-09-15 MED ORDER — TRAMADOL HCL 50 MG PO TABS: 50.0000 mg | ORAL_TABLET | Freq: Three times a day (TID) | ORAL | 1 refills | Status: DC | PRN

## 2023-09-15 NOTE — Progress Notes (Signed)
 Jimmy Mayo - 73 y.o. male MRN [id number]  Date of birth: Feb 27, 1950  Subjective Chief Complaint  Patient presents with   Nasal Congestion    HPI Jimmy Mayo is a 73 y.o. male here today with complaint of sinus and chest congestion.  Symptoms started for about 2-3 weeks ago with worsening over the past week.  Mucus is thick and yellow in nature. Denies fever or chills.  He does have COPD and is using symbicort  and albuterol .    He also needs tramadol  renewed.  This continues to work pretty well for DDD.  No side effects at current strength.    Occasional use of alprazolam  as needed for insomnia.  Several other medications tried in the past..  Requests renewal of this as well.   He does not use this at the same time he uses tramadol .   ROS:  A comprehensive ROS was completed and negative except as noted per HPI  Allergies  Allergen Reactions   Amoxicillin    Niacin And Related Other (See Comments)    Flushing    Penicillins    Pravastatin Sodium Other (See Comments)    REACTION: diarrhea   Zetia  [Ezetimibe ]     constipation    Past Medical History:  Diagnosis Date   Anemia    Anxiety    Asthma    Back pain    Chronic kidney disease    Detached retina, bilateral    March 2020   GERD (gastroesophageal reflux disease)    Hypertension    Kidney stone    Retinal detachment 03/31/2018   Rt eye 03/29/18   Rupture long head biceps tendon 03/22/2017   Vitreous detachment of both eyes 03/31/2018   03/29/18    Past Surgical History:  Procedure Laterality Date   COLONOSCOPY W/ BIOPSIES AND POLYPECTOMY     was to have 1 this past year but planing to see new GI in [place] realize he may need onne every 5 years   HYDROCELE EXCISION / REPAIR     x3 or 2 on 1 and 1 on the other   KNEE ARTHROSCOPY     1986   LITHOTRIPSY     NASAL SINUS SURGERY     RETINAL DETACHMENT SURGERY Bilateral    SPINAL FUSION     2020 and 2021   SPINE SURGERY  01/12/2004   l spine fusion/rods    VASECTOMY      Social History   Socioeconomic History   Marital status: Divorced    Spouse name: Not on file   Number of children: 4   Years of education: 13   Highest education level: 12th grade  Occupational History   Occupation: retired  Tobacco Use   Smoking status: Every Day    Current packs/day: 1.50    Average packs/day: 1.5 packs/day for 35.0 years (52.5 ttl pk-yrs)    Types: Cigarettes   Smokeless tobacco: Never   Tobacco comments:    Not ready to quit at this time   Vaping Use   Vaping status: Former   Substances: Nicotine, Flavoring  Substance and Sexual Activity   Alcohol use: Not Currently    Comment: upsets stomach   Drug use: No   Sexual activity: Not on file  Other Topics Concern   Not on file  Social History Narrative   09/20/2020      Lives alone divorced with 4 children - hobbies stocks bonds    Social Drivers of Health  Financial Resource Strain: Low Risk  (02/14/2023)   Overall Financial Resource Strain (CARDIA)    Difficulty of Paying Living Expenses: Not hard at all  Food Insecurity: No Food Insecurity (02/14/2023)   Hunger Vital Sign    Worried About Running Out of Food in the Last Year: Never true    Ran Out of Food in the Last Year: Never true  Transportation Needs: No Transportation Needs (02/14/2023)   PRAPARE - Administrator, Civil Service (Medical): No    Lack of Transportation (Non-Medical): No  Physical Activity: Insufficiently Active (02/14/2023)   Exercise Vital Sign    Days of Exercise per Week: 1 day    Minutes of Exercise per Session: 20 min  Stress: Stress Concern Present (02/14/2023)   [name]    Feeling of Stress : To some extent  Social Connections: Socially Isolated (02/14/2023)   Social Connection and Isolation Panel    Frequency of Communication with Friends and Family: More than three times a week    Frequency of Social Gatherings with Friends  and Family: Once a week    Attends Religious Services: Never    Database administrator or Organizations: No    Attends Engineer, structural: Not on file    Marital Status: Divorced    Family History  Problem Relation Age of Onset   Cancer Mother        colon   Cancer Father        colon, prostate   Diabetes Father    Prostate cancer Father    Diabetes Maternal Grandmother     Health Maintenance  Topic Date Due   Zoster Vaccines- Shingrix (1 of 2) 07/27/1970   COVID-19 Vaccine (3 - Moderna risk series) 08/07/2019   Medicare Annual Wellness (AWV)  09/20/2021   Lung Cancer Screening  01/20/2022   INFLUENZA VACCINE  08/12/2023   DTaP/Tdap/Td (3 - Td or Tdap) 09/08/2027   Colonoscopy  06/20/2031   Pneumococcal Vaccine: 50+ Years  Completed   Hepatitis C Screening  Completed   HPV VACCINES  Aged Out   Meningococcal B Vaccine  Aged Out     ----------------------------------------------------------------------------------------------------------------------------------------------------------------------------------------------------------------- Physical Exam BP 126/73 (BP Location: Left Arm, Patient Position: Sitting, Cuff Size: Small)   Pulse 71   Ht 5' 9 (1.753 m)   Wt 137 lb (62.1 kg)   SpO2 97%   BMI 20.23 kg/m   Physical Exam Constitutional:      Appearance: Normal appearance.  HENT:     Right Ear: Tympanic membrane normal.     Left Ear: Tympanic membrane normal.     Nose:     Comments: R maxillary sinus ttp.  Eyes:     General: No scleral icterus. Cardiovascular:     Rate and Rhythm: Normal rate and regular rhythm.  Pulmonary:     Comments: Diffuse wheezing bilaterally.  Neurological:     Mental Status: He is alert.  Psychiatric:        Mood and Affect: Mood normal.        Behavior: Behavior normal.      ------------------------------------------------------------------------------------------------------------------------------------------------------------------------------------------------------------------- Assessment and Plan  Essential hypertension, benign Blood pressure is at goal at for age and co-morbidities.  I recommend continuation of current medications.  In addition they were instructed to follow a low sodium diet with regular exercise to help to maintain adequate control of blood pressure.    Acute maxillary sinusitis Adding course of  azithromycin  as this has worked well for him.  Predisone burst of 20mg  BID x5 days. Increased fluid intake recommended.  Red flags reviewed.   COPD exacerbation (HCC) Treating with Z-pack and prednisone .  Encouraged smoking cessation.  Continue current inhalers.   Chronic pain Remains on tramadol  chronically for management of back pain.  Continue this.  No signs of abuse at this time.  Insomnia Alprazolam  at bedtime.  Has tried other medications without improvement.    Meds ordered this encounter  Medications   traMADol  (ULTRAM ) 50 MG tablet    Sig: Take 1 tablet (50 mg total) by mouth every 8 (eight) hours as needed for moderate pain (pain score 4-6).    Dispense:  90 tablet    Refill:  1   ALPRAZolam  (XANAX ) 0.5 MG tablet    Sig: TAKE 1 TABLET(0.5 MG) BY MOUTH THREE TIMES DAILY AS NEEDED    Dispense:  90 tablet    Refill:  1   azithromycin  (ZITHROMAX ) 250 MG tablet    Sig: Take 2 tablets on day 1, then 1 tablet daily on days 2 through 5    Dispense:  6 tablet    Refill:  0   predniSONE  (DELTASONE ) 20 MG tablet    Sig: Take 1 tablet (20 mg total) by mouth 2 (two) times daily with a meal for 5 days.    Dispense:  10 tablet    Refill:  0    No follow-ups on file.

## 2023-09-15 NOTE — Assessment & Plan Note (Signed)
 Adding course of azithromycin  as this has worked well for him.  Predisone burst of 20mg  BID x5 days. Increased fluid intake recommended.  Red flags reviewed.

## 2023-09-15 NOTE — Assessment & Plan Note (Signed)
Remains on tramadol chronically for management of back pain.  Continue this.  No signs of abuse at this time.

## 2023-09-15 NOTE — Assessment & Plan Note (Signed)
Alprazolam at bedtime.  Has tried other medications without improvement.

## 2023-09-15 NOTE — Assessment & Plan Note (Signed)
Blood pressure is at goal at for age and co-morbidities.  I recommend continuation of current medications.  In addition they were instructed to follow a low sodium diet with regular exercise to help to maintain adequate control of blood pressure.  ? ?

## 2023-09-15 NOTE — Assessment & Plan Note (Signed)
 Treating with Z-pack and prednisone .  Encouraged smoking cessation.  Continue current inhalers.

## 2023-09-30 ENCOUNTER — Telehealth: Payer: Self-pay

## 2023-09-30 NOTE — Telephone Encounter (Signed)
 Per note - no prescription refills needed at this time

## 2023-09-30 NOTE — Telephone Encounter (Signed)
 Copied from CRM [id number]. Topic: Clinical - Medication Question >> Sep 30, 2023 11:04 AM Diannia H wrote: Reason for CRM: Patient is calling to let the provider know that he will be changing his pharmacy. Ive already added it and made it #1. At the time he doesn't have any medicine he wants refilled.   [facility]acy: [id number] [place], [place] - [address] [place] [address] [place] [address] [place] [address] Phone: [phone] Fax: [phone] Hours: Not open 24 hours

## 2023-10-03 ENCOUNTER — Other Ambulatory Visit: Payer: Self-pay | Admitting: Family Medicine

## 2023-10-03 NOTE — Telephone Encounter (Unsigned)
 Copied from CRM [id number]. Topic: Clinical - Medication Refill >> Oct 03, 2023  2:00 PM Kevelyn M wrote: Medication: rosuvastatin  (CRESTOR ) 20 MG tablet, finasteride  (PROSCAR ) 5 MG tablet  Has the patient contacted their pharmacy? Yes (Agent: If no, request that the patient contact the pharmacy for the refill. If patient does not wish to contact the pharmacy document the reason why and proceed with request.) (Agent: If yes, when and what did the pharmacy advise?)  This is the patient's preferred pharmacy:  [id number] [place], [place] - [address] [place] [place] [address] [place] [address] Phone: [phone] Fax: [phone]  Is this the correct pharmacy for this prescription? Yes If no, delete pharmacy and type the correct one.   Has the prescription been filled recently? No  Is the patient out of the medication? No  Has the patient been seen for an appointment in the last year OR does the patient have an upcoming appointment? Yes  Can we respond through MyChart? Yes  Agent: Please be advised that Rx refills may take up to 3 business days. We ask that you follow-up with your pharmacy.

## 2023-10-05 ENCOUNTER — Telehealth: Payer: Self-pay

## 2023-10-05 NOTE — Telephone Encounter (Signed)
 Copied from CRM #[id number]. Topic: Clinical - Medical Advice >> Oct 05, 2023 10:14 AM Diannia H wrote: Reason for CRM: Patient is calling because he has thrush and is needing to know if he could get some medicine or does he need to be seen by Dr. Alvia? Could you assist? Patients callback number is [phone].

## 2023-10-06 ENCOUNTER — Other Ambulatory Visit: Payer: Self-pay | Admitting: Family Medicine

## 2023-10-06 MED ORDER — NYSTATIN 100000 UNIT/ML MT SUSP: 500000.0000 [IU] | Freq: Four times a day (QID) | OROMUCOSAL | 0 refills | Status: DC

## 2023-10-06 NOTE — Telephone Encounter (Signed)
 Patient informed.

## 2023-10-12 ENCOUNTER — Other Ambulatory Visit: Payer: Self-pay | Admitting: Family Medicine

## 2023-10-12 NOTE — Telephone Encounter (Unsigned)
 Copied from CRM #[id number]. Topic: Clinical - Medication Refill >> Oct 12, 2023  3:07 PM Dedra B wrote: Medication: finasteride  (PROSCAR ) 5 MG tablet rosuvastatin  (CRESTOR ) 20 MG tablet  Has the patient contacted their pharmacy? No, first time having these meds sent to this pharmacy  This is the patient's preferred pharmacy:  [id number] [place], [place] - [address] [address] [place] [place] [address] Phone: [phone] Fax: [phone]  Is this the correct pharmacy for this prescription? Yes  Has the prescription been filled recently? No  Is the patient out of the medication? Yes  Has the patient been seen for an appointment in the last year OR does the patient have an upcoming appointment? Yes  Can we respond through MyChart? Yes  Agent: Please be advised that Rx refills may take up to 3 business days. We ask that you follow-up with your pharmacy.

## 2023-10-13 ENCOUNTER — Other Ambulatory Visit: Payer: Self-pay

## 2023-10-13 MED ORDER — FINASTERIDE 5 MG PO TABS: ORAL_TABLET | ORAL | 3 refills | Status: AC

## 2023-10-13 MED ORDER — ROSUVASTATIN CALCIUM 20 MG PO TABS: ORAL_TABLET | ORAL | 3 refills | Status: DC

## 2023-10-13 MED ORDER — ROSUVASTATIN CALCIUM 20 MG PO TABS: ORAL_TABLET | ORAL | 3 refills | Status: AC

## 2023-10-18 ENCOUNTER — Other Ambulatory Visit: Payer: Self-pay | Admitting: Family Medicine

## 2023-10-18 MED ORDER — BUDESONIDE-FORMOTEROL FUMARATE 160-4.5 MCG/ACT IN AERO: 2.0000 | INHALATION_SPRAY | Freq: Two times a day (BID) | RESPIRATORY_TRACT | 11 refills | Status: AC

## 2023-10-18 MED ORDER — TRAMADOL HCL 50 MG PO TABS: 50.0000 mg | ORAL_TABLET | Freq: Three times a day (TID) | ORAL | 1 refills | Status: DC | PRN

## 2023-10-18 MED ORDER — ALPRAZOLAM 0.5 MG PO TABS: ORAL_TABLET | ORAL | 1 refills | Status: AC

## 2023-10-18 NOTE — Telephone Encounter (Signed)
 Copied from CRM #[id number]. Topic: Clinical - Medication Refill >> Oct 18, 2023  2:14 PM Yolanda T wrote: Medication: ALPRAZolam  (XANAX ) 0.5 MG tablet, budesonide -formoterol  (SYMBICORT ) 160-4.5 MCG/ACT inhaler and traMADol  (ULTRAM ) 50 MG tablet  Has the patient contacted their pharmacy? No  This is the patient's preferred pharmacy:  [id number] [place], [place] - [address] [place] [address] [place] [address] Phone: [phone] Fax: [phone]  Is this the correct pharmacy for this prescription? Yes  Has the prescription been filled recently? Yes  Is the patient out of the medication? No  Has the patient been seen for an appointment in the last year OR does the patient have an upcoming appointment? Yes  Can we respond through MyChart? Yes  Agent: Please be advised that Rx refills may take up to 3 business days. We ask that you follow-up with your pharmacy.

## 2023-10-20 ENCOUNTER — Telehealth: Payer: Self-pay

## 2023-10-20 NOTE — Telephone Encounter (Signed)
 Message sent to patient via Mychart  That in speaking with pharmacy. All of the medications listed are ready for  pick up .

## 2023-10-20 NOTE — Telephone Encounter (Signed)
 Copied from CRM [id number]. Topic: Clinical - Prescription Issue >> Oct 20, 2023 11:37 AM Darshell M wrote: Reason for CRM: Insurance will not approve  ALPRAZolam  (XANAX ) 0.5 MG tablet, budesonide -formotero. No reason given. [facility], Phone: [phone] Fax: [phone]

## 2023-10-27 ENCOUNTER — Ambulatory Visit: Payer: Self-pay

## 2023-10-27 NOTE — Telephone Encounter (Signed)
 FYI Only or Action Required?: FYI only for provider.  Patient was last seen in primary care on 09/15/2023 by Alvia Bring, DO.  Called Nurse Triage reporting Medication Problem.  Symptoms began several months ago.  Interventions attempted: Rest, hydration, or home remedies.  Symptoms are: gradually worsening.  Triage Disposition: See PCP Within 2 Weeks  Patient/caregiver understands and will follow disposition?: Yes    Copied from CRM [id number]. Topic: Clinical - Red Word Triage >> Oct 27, 2023  3:02 PM Willma R wrote: Red Word that prompted transfer to Nurse Triage: Patient thinks he is having side effects from budesonide -formoterol  (SYMBICORT ) 160-4.5 MCG/ACT inhaler. States he has dry mouth, difficulty swallowing, burning eyes. Has had symptoms for months but has recently gotten worse.  Also would like to get a flu shot. Reason for Disposition  Swallowing difficulty is a chronic symptom (recurrent or ongoing AND present > 4 weeks)  Answer Assessment - Initial Assessment Questions 1. DESCRIPTION: Tell me more about this problem. Are you  having trouble swallowing liquids, solids, or both? Any trouble with swallowing saliva (spit)?     Difficulty with large pills  2. SEVERITY: How bad is the swallowing difficulty?  (Scale 1-10; or mild, moderate, severe)     Not choking, once several months ago on pill.  3. ONSET: When did the swallowing problems begin?      Several months ago 4. CAUSE: What do you think is causing the problem?  (e.g., dry mouth, food or pill stuck in throat, mouth pain, sore throat, progression of disease process such as dementia or Parkinson's disease).      unsure 5. CHRONIC or RECURRENT: Is this a new problem for you?  If No, ask: How long have you had this problem? (e.g., days, weeks, months)      Several months 6. OTHER SYMPTOMS: Do you have any other symptoms? (e.g., chest pain, difficulty breathing, mouth sores, sore throat, swollen  tongue, chest pain)     Dry mouth, dry eyes 7. PREGNANCY: Is there any chance you are pregnant? When was your last menstrual period?  Protocols used: Swallowing Difficulty-A-AH

## 2023-10-31 ENCOUNTER — Encounter: Payer: Self-pay | Admitting: Family Medicine

## 2023-10-31 ENCOUNTER — Ambulatory Visit: Admitting: Family Medicine

## 2023-10-31 VITALS — BP 118/71 | HR 84 | Ht 69.0 in | Wt 134.0 lb

## 2023-10-31 DIAGNOSIS — J449 Chronic obstructive pulmonary disease, unspecified: Secondary | ICD-10-CM

## 2023-10-31 DIAGNOSIS — B370 Candidal stomatitis: Secondary | ICD-10-CM | POA: Diagnosis not present

## 2023-10-31 DIAGNOSIS — Z23 Encounter for immunization: Secondary | ICD-10-CM | POA: Diagnosis not present

## 2023-10-31 MED ORDER — NYSTATIN 100000 UNIT/ML MT SUSP: 500000.0000 [IU] | Freq: Four times a day (QID) | OROMUCOSAL | 0 refills | Status: AC

## 2023-10-31 NOTE — Assessment & Plan Note (Signed)
 He is having side effects from Symbicort .  Will discontinue this for a couple weeks and see if this symptoms improved.  We could try using Stiolto to help with symptom management if he begins to have more symptoms.

## 2023-10-31 NOTE — Assessment & Plan Note (Signed)
 Treating with nystatin .  Updated prescription sent in.

## 2023-10-31 NOTE — Progress Notes (Signed)
 Jimmy Mayo - 73 y.o. male MRN [id number]  Date of birth: 1950-04-08  Subjective Chief Complaint  Patient presents with   Medication Problem    HPI Jimmy Mayo is a 73 y.o. male here today for follow up visit.   He reports that he resumed symbicort  but has been experiencing side effects including palpitations, thrush, and GI symptoms.  He thinks the side effects may be related to the Symbicort .  He would like to try off of this.  He does still use albuterol  as needed.  He does continue to smoke.  ROS:  A comprehensive ROS was completed and negative except as noted per HPI  Allergies  Allergen Reactions   Amoxicillin    Niacin And Related Other (See Comments)    Flushing    Penicillins    Pravastatin Sodium Other (See Comments)    REACTION: diarrhea   Zetia  [Ezetimibe ]     constipation    Past Medical History:  Diagnosis Date   Anemia    Anxiety    Asthma    Back pain    Chronic kidney disease    Detached retina, bilateral    March 2020   GERD (gastroesophageal reflux disease)    Hypertension    Kidney stone    Retinal detachment 03/31/2018   Rt eye 03/29/18   Rupture long head biceps tendon 03/22/2017   Vitreous detachment of both eyes 03/31/2018   03/29/18    Past Surgical History:  Procedure Laterality Date   COLONOSCOPY W/ BIOPSIES AND POLYPECTOMY     was to have 1 this past year but planing to see new GI in [place] realize he may need onne every 5 years   HYDROCELE EXCISION / REPAIR     x3 or 2 on 1 and 1 on the other   KNEE ARTHROSCOPY     1986   LITHOTRIPSY     NASAL SINUS SURGERY     RETINAL DETACHMENT SURGERY Bilateral    SPINAL FUSION     2020 and 2021   SPINE SURGERY  01/12/2004   l spine fusion/rods   VASECTOMY      Social History   Socioeconomic History   Marital status: Divorced    Spouse name: Not on file   Number of children: 4   Years of education: 13   Highest education level: 12th grade  Occupational History    Occupation: retired  Tobacco Use   Smoking status: Every Day    Current packs/day: 1.50    Average packs/day: 1.5 packs/day for 35.0 years (52.5 ttl pk-yrs)    Types: Cigarettes   Smokeless tobacco: Never   Tobacco comments:    Not ready to quit at this time   Vaping Use   Vaping status: Former   Substances: Nicotine, Flavoring  Substance and Sexual Activity   Alcohol use: Not Currently    Comment: upsets stomach   Drug use: No   Sexual activity: Not on file  Other Topics Concern   Not on file  Social History Narrative   09/20/2020      Lives alone divorced with 4 children - hobbies stocks bonds    Social Drivers of Corporate investment banker Strain: Low Risk  (10/27/2023)   Overall Financial Resource Strain (CARDIA)    Difficulty of Paying Living Expenses: Not hard at all  Food Insecurity: No Food Insecurity (10/27/2023)   Hunger Vital Sign    Worried About Running Out of Food in the  Last Year: Never true    Ran Out of Food in the Last Year: Never true  Transportation Needs: No Transportation Needs (10/27/2023)   PRAPARE - Administrator, Civil Service (Medical): No    Lack of Transportation (Non-Medical): No  Physical Activity: Insufficiently Active (10/27/2023)   Exercise Vital Sign    Days of Exercise per Week: 1 day    Minutes of Exercise per Session: 30 min  Stress: Stress Concern Present (10/27/2023)   [name]    Feeling of Stress: Rather much  Social Connections: Socially Isolated (10/27/2023)   Social Connection and Isolation Panel    Frequency of Communication with Friends and Family: More than three times a week    Frequency of Social Gatherings with Friends and Family: Once a week    Attends Religious Services: Never    Database administrator or Organizations: No    Attends Engineer, structural: Not on file    Marital Status: Divorced    Family History  Problem  Relation Age of Onset   Cancer Mother        colon   Cancer Father        colon, prostate   Diabetes Father    Prostate cancer Father    Diabetes Maternal Grandmother     Health Maintenance  Topic Date Due   Zoster Vaccines- Shingrix (1 of 2) 07/27/1970   COVID-19 Vaccine (3 - Moderna risk series) 08/07/2019   Medicare Annual Wellness (AWV)  09/20/2021   Lung Cancer Screening  01/20/2022   DTaP/Tdap/Td (3 - Td or Tdap) 09/08/2027   Colonoscopy  06/20/2031   Pneumococcal Vaccine: 50+ Years  Completed   Influenza Vaccine  Completed   Hepatitis C Screening  Completed   Meningococcal B Vaccine  Aged Out     ----------------------------------------------------------------------------------------------------------------------------------------------------------------------------------------------------------------- Physical Exam BP 118/71   Pulse 84   Ht 5' 9 (1.753 m)   Wt 134 lb (60.8 kg)   SpO2 95%   BMI 19.79 kg/m   Physical Exam Constitutional:      Appearance: Normal appearance.  Eyes:     General: No scleral icterus. Cardiovascular:     Rate and Rhythm: Normal rate and regular rhythm.  Pulmonary:     Effort: Pulmonary effort is normal.     Breath sounds: Normal breath sounds.  Neurological:     Mental Status: He is alert.  Psychiatric:        Mood and Affect: Mood normal.        Behavior: Behavior normal.     ------------------------------------------------------------------------------------------------------------------------------------------------------------------------------------------------------------------- Assessment and Plan  Thrush Treating with nystatin .  Updated prescription sent in.  COPD (chronic obstructive pulmonary disease) (HCC) He is having side effects from Symbicort .  Will discontinue this for a couple weeks and see if this symptoms improved.  We could try using Stiolto to help with symptom management if he begins to have more  symptoms.   Meds ordered this encounter  Medications   nystatin  (MYCOSTATIN ) 100000 UNIT/ML suspension    Sig: Take 5 mLs (500,000 Units total) by mouth 4 (four) times daily. Swish for 30 seconds and spit out.    Dispense:  180 mL    Refill:  0    No follow-ups on file.

## 2023-10-31 NOTE — Patient Instructions (Signed)
 We can try Stiolto if symptoms worsen.

## 2023-11-08 ENCOUNTER — Other Ambulatory Visit: Payer: Self-pay | Admitting: Medical-Surgical

## 2023-11-08 DIAGNOSIS — K269 Duodenal ulcer, unspecified as acute or chronic, without hemorrhage or perforation: Secondary | ICD-10-CM

## 2023-11-11 ENCOUNTER — Other Ambulatory Visit: Payer: Self-pay | Admitting: Family Medicine

## 2023-11-11 DIAGNOSIS — K269 Duodenal ulcer, unspecified as acute or chronic, without hemorrhage or perforation: Secondary | ICD-10-CM

## 2023-11-11 DIAGNOSIS — I10 Essential (primary) hypertension: Secondary | ICD-10-CM

## 2023-11-11 NOTE — Telephone Encounter (Signed)
 Copied from CRM #[id number]. Topic: Clinical - Medication Refill >> Nov 11, 2023 10:45 AM Nathanel BROCKS wrote: Medication:  pantoprazole  (PROTONIX ) 40 MG tablet valsartan -hydrochlorothiazide  (DIOVAN -HCT) 160-12.5 MG tablet  albuterol  (VENTOLIN  HFA) 108 (90 Base) MCG/ACT inhaler  Has the patient contacted their pharmacy? No   This is the patient's preferred pharmacy:  [id number] [place], [place] - [address] [place] [address] [place] [place] [address] Phone: [phone] Fax: [phone]  Is this the correct pharmacy for this prescription? Yes If no, delete pharmacy and type the correct one.   Has the prescription been filled recently? Yes  Is the patient out of the medication? Yes  Has the patient been seen for an appointment in the last year OR does the patient have an upcoming appointment? Yes  Can we respond through MyChart? Yes  Agent: Please be advised that Rx refills may take up to 3 business days. We ask that you follow-up with your pharmacy.

## 2023-11-14 ENCOUNTER — Other Ambulatory Visit: Payer: Self-pay | Admitting: Family Medicine

## 2023-11-14 DIAGNOSIS — I10 Essential (primary) hypertension: Secondary | ICD-10-CM

## 2023-11-17 ENCOUNTER — Other Ambulatory Visit: Payer: Self-pay | Admitting: Family Medicine

## 2023-11-17 NOTE — Telephone Encounter (Unsigned)
 Copied from CRM #[id number]. Topic: Clinical - Medication Question >> Nov 17, 2023  2:51 PM Willma R wrote: Reason for CRM: Patient states at his last visit he and Dr Alvia discussed him trying a medication called Stiolto. Is requesting a prescription be sent to the pharmacy for him to try to: [id number] [place], [place] - [address], [place] [address] Phone: [phone]  Fax: [phone]  Patient can be reached at [phone]

## 2023-11-18 ENCOUNTER — Other Ambulatory Visit: Payer: Self-pay

## 2023-11-18 DIAGNOSIS — I10 Essential (primary) hypertension: Secondary | ICD-10-CM

## 2023-11-18 DIAGNOSIS — K269 Duodenal ulcer, unspecified as acute or chronic, without hemorrhage or perforation: Secondary | ICD-10-CM

## 2023-11-18 MED ORDER — STIOLTO RESPIMAT 2.5-2.5 MCG/ACT IN AERS: 2.0000 | INHALATION_SPRAY | Freq: Every day | RESPIRATORY_TRACT | 3 refills | Status: AC

## 2023-11-18 MED ORDER — ALBUTEROL SULFATE HFA 108 (90 BASE) MCG/ACT IN AERS: 2.0000 | INHALATION_SPRAY | Freq: Four times a day (QID) | RESPIRATORY_TRACT | 1 refills | Status: DC | PRN

## 2023-11-18 MED ORDER — VALSARTAN-HYDROCHLOROTHIAZIDE 160-12.5 MG PO TABS: 1.0000 | ORAL_TABLET | Freq: Every day | ORAL | 1 refills | Status: AC

## 2023-11-18 MED ORDER — PANTOPRAZOLE SODIUM 40 MG PO TBEC: 40.0000 mg | DELAYED_RELEASE_TABLET | Freq: Two times a day (BID) | ORAL | 0 refills | Status: AC

## 2023-11-18 NOTE — Telephone Encounter (Signed)
 Copied from CRM #[id number]. Topic: Clinical - Prescription Issue >> Nov 18, 2023  9:16 AM Selinda RAMAN wrote: Reason for CRM: The patient called in last week 10/31 to get 3 medications refilled: pantoprazole  (PROTONIX ) 40 MG tablet valsartan -hydrochlorothiazide  (DIOVAN -HCT) 160-12.5 MG tablet  albuterol  (VENTOLIN  HFA) 108 (90 Base) MCG/ACT inhaler. He now only uses   [id number] [place], [place] - [address]  Phone: [phone] Fax: [phone]   And no longer does business with the [facility] in [place]. He specifically [facility] as he needs these as soon as possible. His Valsartan  was called in on 11/03 to the wrong pharmacy it was sent to his old pharmacy [facility] and he never got it because he asked for [facility]. He would like for these to be called [facility] today [facility] as the pantoprazole  (PROTONIX ) 40 MG tablet and valsartan -hydrochlorothiazide  (DIOVAN -HCT) 160-12.5 MG tablet were already called in just to the wrong pharmacy. His albuterol  (ventolin ) does not look like it has ever been called in yet. Please assist patient further as soon as possible and let him know when all 3 of these have been [facility] the [facility] so he can pick them up.

## 2023-11-19 ENCOUNTER — Other Ambulatory Visit: Payer: Self-pay | Admitting: Family Medicine

## 2023-11-22 ENCOUNTER — Other Ambulatory Visit: Payer: Self-pay | Admitting: Family Medicine

## 2023-11-22 DIAGNOSIS — I10 Essential (primary) hypertension: Secondary | ICD-10-CM

## 2023-11-22 DIAGNOSIS — K269 Duodenal ulcer, unspecified as acute or chronic, without hemorrhage or perforation: Secondary | ICD-10-CM

## 2023-11-22 NOTE — Telephone Encounter (Unsigned)
 Copied from CRM #[id number]. Topic: Clinical - Medication Refill >> Nov 22, 2023 11:27 AM Charlet HERO wrote: Medication: pantoprazole  (PROTONIX ) 40 MG tablet valsartan -hydrochlorothiazide  (DIOVAN -HCT) 160-12.5 MG tablet  Has the patient contacted their pharmacy? No Wants to go to different pharm  This is the patient's preferred pharmacy:  [id number] [place], [place] - [address] [place] [address] Phone: [phone] Fax: [phone]    Is this the correct pharmacy for this prescription? Yes If no, delete pharmacy and type the correct one.   Has the prescription been filled recently? Yes  Is the patient out of the medication? Yes  Has the patient been seen for an appointment in the last year OR does the patient have an upcoming appointment? Yes  Can we respond through MyChart? Yes  Agent: Please be advised that Rx refills may take up to 3 business days. We ask that you follow-up with your pharmacy.

## 2023-11-25 ENCOUNTER — Telehealth: Payer: Self-pay

## 2023-11-25 NOTE — Telephone Encounter (Signed)
 The prescription was sent to [facility]. Patient is going to [facility] to pick up the prescription.

## 2023-11-25 NOTE — Telephone Encounter (Signed)
 Patient is calling to request if script pantoprazole  (PROTONIX ) 40 MG tablet [[id number]] [facility].

## 2023-11-25 NOTE — Telephone Encounter (Signed)
 I called [facility] and cancelled the prescription for pantoprazole  and [facility] and left [facility] message for them to go ahead and fill the prescription. I advised patient.

## 2023-11-25 NOTE — Telephone Encounter (Signed)
 Attempted call to pharmacy but was closed for lunch -  will attempt call at later time.

## 2023-11-25 NOTE — Telephone Encounter (Signed)
 Copied from CRM #[id number]. Topic: Clinical - Prescription Issue >> Nov 25, 2023 11:34 AM Wess RAMAN wrote: Reason for CRM: Patient still has not received valsartan -hydrochlorothiazide  (DIOVAN -HCT) 160-12.5 MG tablet and pantoprazole  (PROTONIX ) 40 MG tablet. He is unable to receive them due to a timing issue and an override will need to be done by Alvia Bring, DO  Callback #: [phone]  [phone] [place] [place] - [address] [place] [address] Phone: [phone] Fax: [phone] Hours: Not open 24 hours

## 2023-12-19 ENCOUNTER — Other Ambulatory Visit: Payer: Self-pay | Admitting: Family Medicine

## 2024-01-07 ENCOUNTER — Other Ambulatory Visit: Payer: Self-pay | Admitting: Family Medicine

## 2024-01-09 ENCOUNTER — Other Ambulatory Visit: Payer: Self-pay | Admitting: Family Medicine

## 2024-01-09 MED ORDER — ALBUTEROL SULFATE HFA 108 (90 BASE) MCG/ACT IN AERS: 2.0000 | INHALATION_SPRAY | Freq: Four times a day (QID) | RESPIRATORY_TRACT | 1 refills | Status: AC | PRN

## 2024-01-09 NOTE — Telephone Encounter (Signed)
 Copied from CRM #[id number]. Topic: Clinical - Medication Refill >> Jan 09, 2024  1:55 PM Dominique A wrote: Medication: albuterol  (VENTOLIN  HFA) 108 (90 Base) MCG/ACT inhaler  Has the patient contacted their pharmacy? Yes Pharmacy sent in the request on 01/07/24  This is the patient's preferred pharmacy:  [id number] [place], [place] - [address] [place] [address] Phone: [phone] Fax: [phone]   Is this the correct pharmacy for this prescription? Yes   Has the prescription been filled recently? No  Is the patient out of the medication? No. Patient is almost out of medication and does not want to run out. Would like for medication to be called in today. Did explain the turn around time and explained the pharmacy sent the request on Saturday when the office was closed.   Has the patient been seen for an appointment in the last year OR does the patient have an upcoming appointment? Yes  Can we respond through MyChart? No  Agent: Please be advised that Rx refills may take up to 3 business days. We ask that you follow-up with your pharmacy.

## 2024-01-09 NOTE — Telephone Encounter (Signed)
 Requesting rx rf of alubterol HFA  Last written 11/22/2023 Last OV 10/31/2023 Upcoming appt = none
